# Patient Record
Sex: Female | Born: 1960
Health system: Southern US, Community
[De-identification: ages and names within clinical notes are randomized; demographics above are authoritative.]

## PROBLEM LIST (undated history)

## (undated) DIAGNOSIS — I428 Other cardiomyopathies: Secondary | ICD-10-CM

## (undated) DIAGNOSIS — E538 Deficiency of other specified B group vitamins: Secondary | ICD-10-CM

## (undated) DIAGNOSIS — I10 Essential (primary) hypertension: Secondary | ICD-10-CM

## (undated) DIAGNOSIS — M1990 Unspecified osteoarthritis, unspecified site: Secondary | ICD-10-CM

## (undated) DIAGNOSIS — I509 Heart failure, unspecified: Secondary | ICD-10-CM

## (undated) DIAGNOSIS — E7800 Pure hypercholesterolemia, unspecified: Secondary | ICD-10-CM

## (undated) DIAGNOSIS — E119 Type 2 diabetes mellitus without complications: Secondary | ICD-10-CM

## (undated) DIAGNOSIS — F3181 Bipolar II disorder: Secondary | ICD-10-CM

## (undated) DIAGNOSIS — N3946 Mixed incontinence: Secondary | ICD-10-CM

## (undated) DIAGNOSIS — E1300 Other specified diabetes mellitus with hyperosmolarity without nonketotic hyperglycemic-hyperosmolar coma (NKHHC): Secondary | ICD-10-CM

## (undated) DIAGNOSIS — F329 Major depressive disorder, single episode, unspecified: Secondary | ICD-10-CM

## (undated) DIAGNOSIS — K219 Gastro-esophageal reflux disease without esophagitis: Secondary | ICD-10-CM

## (undated) DIAGNOSIS — F32A Depression, unspecified: Secondary | ICD-10-CM

## (undated) HISTORY — DX: Unspecified osteoarthritis, unspecified site: M19.90

## (undated) HISTORY — DX: Heart failure, unspecified: I50.9

## (undated) HISTORY — DX: Other cardiomyopathies: I42.8

## (undated) HISTORY — PX: ABDOMINAL HYSTERECTOMY: SHX81

## (undated) HISTORY — PX: SKIN BIOPSY: SHX1

## (undated) HISTORY — DX: Other specified diabetes mellitus with hyperosmolarity without nonketotic hyperglycemic-hyperosmolar coma (NKHHC): E13.00

## (undated) HISTORY — DX: Pure hypercholesterolemia, unspecified: E78.00

## (undated) HISTORY — DX: Deficiency of other specified B group vitamins: E53.8

## (undated) HISTORY — DX: Type 2 diabetes mellitus without complications: E11.9

## (undated) HISTORY — DX: Bipolar II disorder: F31.81

## (undated) HISTORY — PX: EYE SURGERY: SHX253

## (undated) HISTORY — PX: CATARACT EXTRACTION: SUR2

## (undated) HISTORY — PX: ABDOMINAL SURGERY: SHX537

## (undated) HISTORY — PX: STOMACH SURGERY: SHX791

## (undated) HISTORY — PX: CHOLECYSTECTOMY: SHX55

## (undated) HISTORY — DX: Mixed incontinence: N39.46

---

## 2009-03-30 ENCOUNTER — Emergency Department (HOSPITAL_COMMUNITY): Admission: EM | Admit: 2009-03-30 | Discharge: 2009-03-30 | Payer: Self-pay | Admitting: Emergency Medicine

## 2011-02-25 ENCOUNTER — Inpatient Hospital Stay (INDEPENDENT_AMBULATORY_CARE_PROVIDER_SITE_OTHER)
Admission: RE | Admit: 2011-02-25 | Discharge: 2011-02-25 | Disposition: A | Discharge status: Home / Self Care | Payer: Self-pay | Source: Ambulatory Visit | Attending: Family Medicine | Admitting: Family Medicine

## 2011-02-25 DIAGNOSIS — M799 Soft tissue disorder, unspecified: Secondary | ICD-10-CM

## 2011-02-25 DIAGNOSIS — I10 Essential (primary) hypertension: Secondary | ICD-10-CM

## 2011-05-16 ENCOUNTER — Emergency Department (HOSPITAL_COMMUNITY)
Admission: EM | Admit: 2011-05-16 | Discharge: 2011-05-16 | Disposition: A | Discharge status: Home / Self Care | Payer: Self-pay | Source: Home / Self Care | Attending: Emergency Medicine | Admitting: Emergency Medicine

## 2011-05-16 DIAGNOSIS — J039 Acute tonsillitis, unspecified: Secondary | ICD-10-CM

## 2011-05-16 HISTORY — DX: Essential (primary) hypertension: I10

## 2011-05-16 HISTORY — DX: Depression, unspecified: F32.A

## 2011-05-16 HISTORY — DX: Major depressive disorder, single episode, unspecified: F32.9

## 2011-05-16 HISTORY — DX: Gastro-esophageal reflux disease without esophagitis: K21.9

## 2011-05-16 LAB — POCT RAPID STREP A: Streptococcus, Group A Screen (Direct): NEGATIVE

## 2011-05-16 MED ORDER — CEPHALEXIN 500 MG PO CAPS: 500.0000 mg | ORAL_CAPSULE | Freq: Three times a day (TID) | ORAL | Status: AC

## 2011-05-16 MED ORDER — LISINOPRIL-HYDROCHLOROTHIAZIDE 20-12.5 MG PO TABS: 1.0000 | ORAL_TABLET | Freq: Every day | ORAL | Status: DC

## 2011-05-16 NOTE — ED Notes (Signed)
Has had right earache for 9 days.  As well as sore throat.  Headache.

## 2011-05-16 NOTE — ED Provider Notes (Signed)
History     CSN: [id number] Arrival date & time: 05/16/2011  8:12 PM   First MD Initiated Contact with Patient 05/16/11 1854      Chief Complaint  Patient presents with  . Otalgia    Has had right earache for 9 days.  As well as sore throat.  Headache.     (Consider location/radiation/quality/duration/timing/severity/associated sxs/prior treatment) HPI Comments: She has had a 9 day history that began with a fever blister on her right upper lip. This healed up and then she developed sore throat with radiation to the right ear, swelling of the right side of the face, chills, and a dry cough. She denies any fever or chills. It hurts to swallow. She denies any nasal congestion or drainage. She's not had the wheezing, shortness of breath, nausea, or vomiting.  She also needs a refill on her lisinopril/HCTZ which she takes for blood pressure. She is thinking that her blood pressure might be elevated since she feels dizzy when she stands up, however her blood pressure in the urgent care center today was normal.  Patient is a 50 y.o. female presenting with ear pain.  Otalgia Associated symptoms include sore throat and cough. Pertinent negatives include no rhinorrhea, no abdominal pain, no diarrhea, no vomiting and no rash.    Past Medical History  Diagnosis Date  . Asthma   . Hypertension   . GERD (gastroesophageal reflux disease)   . Depression     Past Surgical History  Procedure Date  . Cesarian   . Cholecystectomy   . Abdominal surgery   . Abdominal hysterectomy     History reviewed. No pertinent family history.  History  Substance Use Topics  . Smoking status: Never Smoker   . Smokeless tobacco: Never Used  . Alcohol Use: No    OB History    Grav Para Term Preterm Abortions TAB SAB Ect Mult Living                  Review of Systems  Constitutional: Positive for chills. Negative for fever and fatigue.  HENT: Positive for ear pain, sore throat and mouth sores.  Negative for congestion, rhinorrhea, sneezing, neck stiffness, voice change and postnasal drip.   Eyes: Negative for pain, discharge and redness.  Respiratory: Positive for cough. Negative for chest tightness, shortness of breath and wheezing.   Gastrointestinal: Negative for nausea, vomiting, abdominal pain and diarrhea.  Skin: Negative for rash.    Allergies  Review of patient's allergies indicates no known allergies.  Home Medications   Current Outpatient Rx  Name Route Sig Dispense Refill  . BUPROPION HCL ER (XL) 150 MG PO TB24 Oral Take 150 mg by mouth daily.      . BUSPIRONE HCL 15 MG PO TABS Oral Take 15 mg by mouth 2 (two) times daily.      Marland Kitchen LISINOPRIL-HYDROCHLOROTHIAZIDE 20-12.5 MG PO TABS Oral Take 1 tablet by mouth daily.      . CEPHALEXIN 500 MG PO CAPS Oral Take 1 capsule (500 mg total) by mouth 3 (three) times daily. 30 capsule 0  . CYCLOBENZAPRINE HCL 5 MG PO TABS Oral Take 5 mg by mouth 3 (three) times daily as needed.      Marland Kitchen LISINOPRIL-HYDROCHLOROTHIAZIDE 20-12.5 MG PO TABS Oral Take 1 tablet by mouth daily. 30 tablet 0    BP 137/92  Pulse 94  Temp(Src) 98.3 F (36.8 C) (Oral)  Resp 18  SpO2 96%  Physical Exam  Nursing note and  vitals reviewed. Constitutional: She appears well-developed and well-nourished. No distress.  HENT:  Head: Normocephalic and atraumatic.  Right Ear: External ear normal.  Left Ear: External ear normal.  Nose: Nose normal.  Mouth/Throat: No oropharyngeal exudate.       Both tonsils were enlarged and red. There was a small amount of exudate present bilaterally. There were no ulcers inside the mouth. Both TMs and ear canals are normal. She had some tender anterior cervical adenopathy bilaterally.  Eyes: Conjunctivae and EOM are normal. Pupils are equal, round, and reactive to light. Right eye exhibits no discharge. Left eye exhibits no discharge.  Neck: Normal range of motion. Neck supple.  Cardiovascular: Normal rate, regular rhythm and  normal heart sounds.   Pulmonary/Chest: Effort normal and breath sounds normal. No stridor. No respiratory distress. She has no wheezes. She has no rales. She exhibits no tenderness.  Lymphadenopathy:    She has no cervical adenopathy.  Skin: Skin is warm and dry. No rash noted. She is not diaphoretic.    ED Course  Procedures (including critical care time)   Labs Reviewed  POCT RAPID STREP A ([facility] CARE ONLY)       her rapid strep was negative.   No results found.   1. Tonsillitis       MDM  She has symptomatic tonsillitis with a negative rapid strep. I elected to go ahead and treat this with cephalexin. I also refilled her lisinopril/hydrochlorothiazide. I strongly admonished her to find a primary care physician for followup of her blood pressure.        Roque Lias, MD 05/16/11 2133

## 2011-09-09 ENCOUNTER — Encounter (HOSPITAL_COMMUNITY): Payer: Self-pay | Admitting: Emergency Medicine

## 2011-09-09 ENCOUNTER — Inpatient Hospital Stay (HOSPITAL_COMMUNITY)
Admission: EM | Admit: 2011-09-09 | Discharge: 2011-09-11 | DRG: 638 | Disposition: A | Discharge status: Home / Self Care | Payer: Self-pay | Attending: Internal Medicine | Admitting: Internal Medicine

## 2011-09-09 DIAGNOSIS — R42 Dizziness and giddiness: Secondary | ICD-10-CM | POA: Diagnosis present

## 2011-09-09 DIAGNOSIS — E1110 Type 2 diabetes mellitus with ketoacidosis without coma: Secondary | ICD-10-CM | POA: Diagnosis present

## 2011-09-09 DIAGNOSIS — F3289 Other specified depressive episodes: Secondary | ICD-10-CM | POA: Diagnosis present

## 2011-09-09 DIAGNOSIS — E1300 Other specified diabetes mellitus with hyperosmolarity without nonketotic hyperglycemic-hyperosmolar coma (NKHHC): Secondary | ICD-10-CM | POA: Diagnosis present

## 2011-09-09 DIAGNOSIS — J45909 Unspecified asthma, uncomplicated: Secondary | ICD-10-CM | POA: Diagnosis present

## 2011-09-09 DIAGNOSIS — F329 Major depressive disorder, single episode, unspecified: Secondary | ICD-10-CM | POA: Diagnosis present

## 2011-09-09 DIAGNOSIS — E860 Dehydration: Secondary | ICD-10-CM | POA: Diagnosis present

## 2011-09-09 DIAGNOSIS — K219 Gastro-esophageal reflux disease without esophagitis: Secondary | ICD-10-CM | POA: Diagnosis present

## 2011-09-09 DIAGNOSIS — I10 Essential (primary) hypertension: Secondary | ICD-10-CM | POA: Diagnosis present

## 2011-09-09 DIAGNOSIS — R739 Hyperglycemia, unspecified: Secondary | ICD-10-CM

## 2011-09-09 DIAGNOSIS — E875 Hyperkalemia: Secondary | ICD-10-CM | POA: Diagnosis present

## 2011-09-09 DIAGNOSIS — E1100 Type 2 diabetes mellitus with hyperosmolarity without nonketotic hyperglycemic-hyperosmolar coma (NKHHC): Principal | ICD-10-CM | POA: Diagnosis present

## 2011-09-09 DIAGNOSIS — E871 Hypo-osmolality and hyponatremia: Secondary | ICD-10-CM | POA: Diagnosis present

## 2011-09-09 DIAGNOSIS — E876 Hypokalemia: Secondary | ICD-10-CM | POA: Diagnosis not present

## 2011-09-09 LAB — GLUCOSE, CAPILLARY
Glucose-Capillary: 155 mg/dL — ABNORMAL HIGH (ref 70–99)
Glucose-Capillary: 172 mg/dL — ABNORMAL HIGH (ref 70–99)
Glucose-Capillary: 173 mg/dL — ABNORMAL HIGH (ref 70–99)
Glucose-Capillary: 316 mg/dL — ABNORMAL HIGH (ref 70–99)
Glucose-Capillary: 595 mg/dL (ref 70–99)
Glucose-Capillary: >600 mg/dL (ref 70–99)

## 2011-09-09 LAB — BASIC METABOLIC PANEL
BUN: 10 mg/dL (ref 6–23)
BUN: 8 mg/dL (ref 6–23)
BUN: 8 mg/dL (ref 6–23)
CO2: 27 mEq/L (ref 19–32)
CO2: 26 mEq/L (ref 19–32)
CO2: 27 mEq/L (ref 19–32)
CO2: 28 mEq/L (ref 19–32)
Calcium: 10.5 mg/dL (ref 8.4–10.5)
Calcium: 9.5 mg/dL (ref 8.4–10.5)
Calcium: 9.5 mg/dL (ref 8.4–10.5)
Chloride: 97 mEq/L (ref 96–112)
Chloride: 101 mEq/L (ref 96–112)
Chloride: 100 mEq/L (ref 96–112)
Creatinine, Ser: 0.81 mg/dL (ref 0.50–1.10)
Creatinine, Ser: 0.67 mg/dL (ref 0.50–1.10)
Creatinine, Ser: 0.68 mg/dL (ref 0.50–1.10)
Creatinine, Ser: 0.64 mg/dL (ref 0.50–1.10)
GFR calc Af Amer: >90 mL/min (ref >90)
GFR calc Af Amer: >90 mL/min (ref >90)
GFR calc Af Amer: >90 mL/min (ref >90)
GFR calc Af Amer: >90 mL/min (ref >90)
GFR calc non Af Amer: 83 mL/min — ABNORMAL LOW (ref >90)
GFR calc non Af Amer: >90 mL/min (ref >90)
GFR calc non Af Amer: >90 mL/min (ref >90)
Glucose, Bld: 502 mg/dL — ABNORMAL HIGH (ref 70–99)
Glucose, Bld: 309 mg/dL — ABNORMAL HIGH (ref 70–99)
Potassium: 4.1 mEq/L (ref 3.5–5.1)
Potassium: 3.9 mEq/L (ref 3.5–5.1)
Potassium: 3.4 mEq/L — ABNORMAL LOW (ref 3.5–5.1)
Sodium: 125 mEq/L — ABNORMAL LOW (ref 135–145)
Sodium: 138 mEq/L (ref 135–145)
Sodium: 140 mEq/L (ref 135–145)

## 2011-09-09 LAB — CBC
HCT: 38.7 % (ref 36.0–46.0)
HCT: 36.9 % (ref 36.0–46.0)
Hemoglobin: 13.2 g/dL (ref 12.0–15.0)
MCH: 28.1 pg (ref 26.0–34.0)
MCH: 28.6 pg (ref 26.0–34.0)
MCHC: 31.3 g/dL (ref 30.0–36.0)
MCHC: 34.1 g/dL (ref 30.0–36.0)
MCV: 90.4 fL (ref 78.0–100.0)
MCV: 82.4 fL (ref 78.0–100.0)
Platelets: 272 10*3/uL (ref 150–400)
Platelets: 274 10*3/uL (ref 150–400)
RBC: 4.48 MIL/uL (ref 3.87–5.11)
RDW: 15.1 % (ref 11.5–15.5)
RDW: 13.7 % (ref 11.5–15.5)
RDW: 13.8 % (ref 11.5–15.5)
WBC: 7.3 10*3/uL (ref 4.0–10.5)
WBC: 10.6 10*3/uL — ABNORMAL HIGH (ref 4.0–10.5)

## 2011-09-09 LAB — URINALYSIS, ROUTINE W REFLEX MICROSCOPIC
Glucose, UA: >1000 mg/dL — AB
Ketones, ur: 15 mg/dL — AB
Leukocytes, UA: NEGATIVE
Nitrite: NEGATIVE
Protein, ur: NEGATIVE mg/dL
Urobilinogen, UA: 0.2 mg/dL (ref 0.0–1.0)

## 2011-09-09 LAB — MRSA PCR SCREENING: MRSA by PCR: NEGATIVE

## 2011-09-09 LAB — DIFFERENTIAL
Basophils Absolute: 0 10*3/uL (ref 0.0–0.1)
Basophils Relative: 0 % (ref 0–1)
Eosinophils Relative: 0 % (ref 0–5)
Lymphocytes Relative: 17 % (ref 12–46)
Monocytes Absolute: 0.8 10*3/uL (ref 0.1–1.0)

## 2011-09-09 MED ORDER — PANTOPRAZOLE SODIUM 40 MG PO TBEC
40.0000 mg | DELAYED_RELEASE_TABLET | Freq: Every day | ORAL | Status: DC
  Administered 2011-09-10 – 2011-09-11 (×2): 40 mg via ORAL
  Filled 2011-09-09 (×2): qty 1

## 2011-09-09 MED ORDER — SODIUM CHLORIDE 0.9 % IV SOLN
INTRAVENOUS | Status: DC
  Administered 2011-09-10: 4.8 [IU]/h via INTRAVENOUS
  Filled 2011-09-09 (×6): qty 1

## 2011-09-09 MED ORDER — SODIUM CHLORIDE 0.9 % IJ SOLN
3.0000 mL | Freq: Two times a day (BID) | INTRAMUSCULAR | Status: DC
  Administered 2011-09-11: 3 mL via INTRAVENOUS

## 2011-09-09 MED ORDER — ONDANSETRON HCL 4 MG PO TABS: 4.0000 mg | ORAL_TABLET | Freq: Four times a day (QID) | ORAL | Status: DC | PRN

## 2011-09-09 MED ORDER — ALUM & MAG HYDROXIDE-SIMETH 200-200-20 MG/5ML PO SUSP: 30.0000 mL | Freq: Four times a day (QID) | ORAL | Status: DC | PRN

## 2011-09-09 MED ORDER — SODIUM CHLORIDE 0.9 % IV SOLN: INTRAVENOUS | Status: DC

## 2011-09-09 MED ORDER — SODIUM CHLORIDE 0.9 % IV BOLUS (SEPSIS)
1000.0000 mL | Freq: Once | INTRAVENOUS | Status: AC
  Administered 2011-09-09: 1000 mL via INTRAVENOUS

## 2011-09-09 MED ORDER — SODIUM CHLORIDE 0.9 % IV SOLN
INTRAVENOUS | Status: DC
  Administered 2011-09-09: 5.4 [IU]/h via INTRAVENOUS
  Filled 2011-09-09: qty 1

## 2011-09-09 MED ORDER — DARIFENACIN HYDROBROMIDE ER 7.5 MG PO TB24
7.5000 mg | ORAL_TABLET | Freq: Every day | ORAL | Status: DC
  Administered 2011-09-10 – 2011-09-11 (×2): 7.5 mg via ORAL
  Filled 2011-09-09 (×2): qty 1

## 2011-09-09 MED ORDER — DEXTROSE 50 % IV SOLN: 25.0000 mL | INTRAVENOUS | Status: DC | PRN

## 2011-09-09 MED ORDER — CYCLOBENZAPRINE HCL 10 MG PO TABS: 5.0000 mg | ORAL_TABLET | Freq: Three times a day (TID) | ORAL | Status: DC | PRN

## 2011-09-09 MED ORDER — POTASSIUM CHLORIDE 10 MEQ/100ML IV SOLN
INTRAVENOUS | Status: AC
  Filled 2011-09-09: qty 100

## 2011-09-09 MED ORDER — INSULIN REGULAR BOLUS VIA INFUSION
0.0000 [IU] | Freq: Three times a day (TID) | INTRAVENOUS | Status: DC
  Filled 2011-09-09: qty 10

## 2011-09-09 MED ORDER — POTASSIUM CHLORIDE 10 MEQ/100ML IV SOLN
10.0000 meq | INTRAVENOUS | Status: AC
  Administered 2011-09-09 – 2011-09-10 (×2): 10 meq via INTRAVENOUS
  Filled 2011-09-09 (×2): qty 100

## 2011-09-09 MED ORDER — HYDROCODONE-ACETAMINOPHEN 5-325 MG PO TABS: 1.0000 | ORAL_TABLET | ORAL | Status: DC | PRN

## 2011-09-09 MED ORDER — SODIUM CHLORIDE 0.9 % IV SOLN
INTRAVENOUS | Status: DC
  Administered 2011-09-09: 18:00:00 via INTRAVENOUS

## 2011-09-09 MED ORDER — HYDROMORPHONE HCL PF 1 MG/ML IJ SOLN: 1.0000 mg | INTRAMUSCULAR | Status: DC | PRN

## 2011-09-09 MED ORDER — SODIUM CHLORIDE 0.9 % IV SOLN
INTRAVENOUS | Status: DC
  Administered 2011-09-09: 14:00:00 via INTRAVENOUS

## 2011-09-09 MED ORDER — POTASSIUM CHLORIDE 10 MEQ/100ML IV SOLN
10.0000 meq | INTRAVENOUS | Status: AC
  Administered 2011-09-09 (×2): 10 meq via INTRAVENOUS
  Filled 2011-09-09: qty 100

## 2011-09-09 MED ORDER — ACETAMINOPHEN 325 MG PO TABS: 650.0000 mg | ORAL_TABLET | Freq: Four times a day (QID) | ORAL | Status: DC | PRN

## 2011-09-09 MED ORDER — DEXTROSE-NACL 5-0.45 % IV SOLN
INTRAVENOUS | Status: DC
  Administered 2011-09-09 – 2011-09-10 (×2): via INTRAVENOUS

## 2011-09-09 MED ORDER — SODIUM CHLORIDE 0.9 % IV SOLN
INTRAVENOUS | Status: AC
  Administered 2011-09-09: 16:00:00 via INTRAVENOUS

## 2011-09-09 MED ORDER — ACETAMINOPHEN 650 MG RE SUPP: 650.0000 mg | Freq: Four times a day (QID) | RECTAL | Status: DC | PRN

## 2011-09-09 MED ORDER — ONDANSETRON HCL 4 MG/2ML IJ SOLN: 4.0000 mg | Freq: Four times a day (QID) | INTRAMUSCULAR | Status: DC | PRN

## 2011-09-09 NOTE — ED Notes (Signed)
POCT CBG resulted 595; Melissa, RN notified

## 2011-09-09 NOTE — H&P (Signed)
History and Physical       Hospital Admission Note Date: 09/09/2011  Patient name: Jean Melton Medical record number: [id number] Date of birth: January 21, 1961 Age: 51 y.o. Gender: female PCP: Pcp Not In System  Attending physician: Cathren Harsh, MD   Chief Complaint:  Generalized weakness with polyuria and polydipsia for last 2-3 weeks  HPI: Patient is a 51 year old female with history of asthma, hypertension, GERD presented to [facility] emergency room with worsening generalized weakness and feeling dehydrated for last several weeks. History was obtained from the patient and her daughter present in the room. Patient states that over the last 3-4 weeks she was noticing increasing frequency of urination, almost every hour and hence she was drinking a lot of fluids. She stated that she was initially drinking water which did not have them and she started drinking juices and ginger ale. She still felt generalized weakness and chapped lips. She felt dizzy and lightheaded but no syncopal episodes. Patient and her daughter came to the ER for checkup and was found to have blood glucose in 1300's, anion gap of 16 with positive ketones. Patient has no known history of diabetes mellitus although her father had diabetes. She has no PCP. Also states has 20 pounds weight loss in the last 2 months, has not been eating well.  Review of Systems:  Constitutional: Denies fever, chills, diaphoresis, appetite change and fatigue.  HEENT: Denies photophobia, eye pain, redness, hearing loss, ear pain, congestion, sore throat, rhinorrhea, sneezing, mouth sores, trouble swallowing, neck pain, neck stiffness and tinnitus.   Respiratory: Denies SOB, DOE, cough, chest tightness,  and wheezing.   Cardiovascular: Denies chest pain, palpitations and leg swelling.  Gastrointestinal: Denies nausea, vomiting, abdominal pain, diarrhea, constipation, blood in stool and abdominal  distention.  Genitourinary: Denies frequency, hematuria, flank pain and difficulty urinating.  endorses increased polydipsia and polyuria Musculoskeletal: Denies myalgias, back pain, joint swelling, arthralgias and gait problem.  Skin: Denies pallor, rash and wound.  Neurological: Denies seizures, syncope, numbness and headaches.  has dizziness, lightheadedness  Hematological: Denies adenopathy. Easy bruising, personal or family bleeding history  Psychiatric/Behavioral: Denies suicidal ideation, mood changes, confusion, nervousness, sleep disturbance and agitation  Past Medical History: Past Medical History  Diagnosis Date  . Asthma   . Hypertension   . GERD (gastroesophageal reflux disease)   . Depression    Past Surgical History  Procedure Date  . Cesarian   . Cholecystectomy   . Abdominal surgery   . Abdominal hysterectomy     Medications: Prior to Admission medications   Medication Sig Start Date End Date Taking? Authorizing Provider  lisinopril-hydrochlorothiazide (PRINZIDE,ZESTORETIC) 20-12.5 MG per tablet Take 1 tablet by mouth daily.     Yes Historical Provider, MD  omeprazole (PRILOSEC) 20 MG capsule Take 20 mg by mouth daily.   Yes Historical Provider, MD  solifenacin (VESICARE) 5 MG tablet Take 10 mg by mouth daily.   Yes Historical Provider, MD  cyclobenzaprine (FLEXERIL) 5 MG tablet Take 5 mg by mouth 3 (three) times daily as needed. For muscle spasms    Historical Provider, MD    Allergies:  No Known Allergies  Social History:  reports that she has never smoked. She has never used smokeless tobacco. She reports that she does not drink alcohol or use illicit drugs.  Family History: History reviewed. No pertinent family history.  Physical Exam: Blood pressure 132/88, pulse 83, temperature 97.3 F (36.3 C), temperature source Oral, resp. rate 18, SpO2 96.00%. General:  Alert, awake, oriented x3, in no acute distress, pleasant and cooperative, keeps drinking  water throughout the examination  HEENT: anicteric sclera, pink conjunctiva, pupils equal and reactive to light and accomodation, dry mucosa membranes with chapped lips Neck: supple, no masses or lymphadenopathy, no goiter, no bruits  Heart: Regular rate and rhythm, without murmurs, rubs or gallops. Lungs: Clear to auscultation bilaterally, no wheezing, rales or rhonchi. Abdomen: Soft, nontender, nondistended, positive bowel sounds, no masses. Extremities: No clubbing, cyanosis or edema with positive pedal pulses. Neuro: Grossly intact, no focal neurological deficits, strength 5/5 upper and lower extremities bilaterally Psych: alert and oriented x 3, normal mood and affect Skin: no rashes or lesions, warm and dry   LABS on Admission:  Basic Metabolic Panel:  Lab 09/09/11 1308  NA 125*  K 5.1  CL 82*  CO2 27  GLUCOSE 1302*  BUN 12  CREATININE 0.81  CALCIUM 10.5  MG --  PHOS --    CBC:  Lab 09/09/11 1111  WBC 7.3  NEUTROABS 5.2  HGB 15.0  HCT 47.9*  MCV 90.4  PLT 272   CBG:  Lab 09/09/11 1325 09/09/11 1026  GLUCAP 595* >600*     Radiological Exams on Admission: No results found.  Assessment/Plan  .DKA (diabetic ketoacidoses) - Anion gap of 16 with positive ketones likely from dehydration,  bicarbonate 27, likely hyperosmolar hyperglycemia with positive ketones - Placed on IV insulin drip and aggressive IV fluid hydration, follow DKA protocol and transitioned to Lantus and sliding scale insulin once blood sugars within target range of 140- 180   .Diabetes mellitus, new onset: - Obtain HbA1c, discussed with case management as patient is going to need PCP, aggressive diabetic education and dietitian consult  .Hyponatremia: Pseudohyponatremia, corrected sodium 144   .Hyperkalemia: Likely to improve with insulin infusion   .HTN (hypertension): Hold HCTZ  for now, currently stable   .Dehydration: Continue aggressive IV fluid hydration   .GERD (gastroesophageal  reflux disease): Continue protonix   .Asthma: Stable  DVT prophylaxis: SCDs  CODE STATUS: Full code  @Time  Spent on Admission: 1 hour Jean Melton M.D. [facility] 09/09/2011, 2:02 PM

## 2011-09-09 NOTE — ED Notes (Signed)
Pt received from stretcher triage, report received. Pt placed on monitor. Pt reports several week hx of not feeling well, reports generalized fatigue. Reports increased thirst. Pt reports unintentional weight loss. Pt with no hx of diabetes.

## 2011-09-09 NOTE — ED Notes (Signed)
Dr. Anitra Lauth notified of high blood sugar

## 2011-09-09 NOTE — ED Provider Notes (Addendum)
History     CSN: [id number]  Arrival date & time 09/09/11  7829   First MD Initiated Contact with Patient 09/09/11 1009      Chief Complaint  Patient presents with  . Weight Loss  . Weakness    (Consider location/radiation/quality/duration/timing/severity/associated sxs/prior treatment) Patient is a 51 y.o. female presenting with weakness. The history is provided by the patient.  Weakness Primary symptoms do not include headaches, syncope, dizziness, paresthesias, speech change, fever, nausea or vomiting. Primary symptoms comment: Generalized weakness Episode onset: 2 months ago. The symptoms are worsening. The neurological symptoms are diffuse. Context: Has just noticed over the last 2 munchies gradually developed generalized weakness.  Additional symptoms include weakness and taste disturbance. Additional symptoms do not include lower back pain or leg pain. Associated symptoms comments: Polyuria and polydipsia. Medical issues do not include seizures, alcohol use, drug use or diabetes.    Past Medical History  Diagnosis Date  . Asthma   . Hypertension   . GERD (gastroesophageal reflux disease)   . Depression     Past Surgical History  Procedure Date  . Cesarian   . Cholecystectomy   . Abdominal surgery   . Abdominal hysterectomy     History reviewed. No pertinent family history.  History  Substance Use Topics  . Smoking status: Never Smoker   . Smokeless tobacco: Never Used  . Alcohol Use: No    OB History    Grav Para Term Preterm Abortions TAB SAB Ect Mult Living                  Review of Systems  Constitutional: Positive for unexpected weight change. Negative for fever.       20 pound weight loss in 2 months  Cardiovascular: Negative for syncope.  Gastrointestinal: Negative for nausea, vomiting and abdominal pain.  Genitourinary: Negative for dysuria.       Polyuria   Neurological: Positive for weakness. Negative for dizziness, speech change, headaches  and paresthesias.  All other systems reviewed and are negative.    Allergies  Review of patient's allergies indicates no known allergies.  Home Medications   Current Outpatient Rx  Name Route Sig Dispense Refill  . LISINOPRIL-HYDROCHLOROTHIAZIDE 20-12.5 MG PO TABS Oral Take 1 tablet by mouth daily.      Marland Kitchen OMEPRAZOLE 20 MG PO CPDR Oral Take 20 mg by mouth daily.    Marland Kitchen SOLIFENACIN SUCCINATE 5 MG PO TABS Oral Take 10 mg by mouth daily.    . CYCLOBENZAPRINE HCL 5 MG PO TABS Oral Take 5 mg by mouth 3 (three) times daily as needed. For muscle spasms      BP 142/89  Pulse 107  Temp(Src) 97.3 F (36.3 C) (Oral)  Resp 20  SpO2 99%  Physical Exam  Nursing note and vitals reviewed. Constitutional: She is oriented to person, place, and time. She appears well-developed and well-nourished. No distress.  HENT:  Head: Normocephalic and atraumatic.  Mouth/Throat: Mucous membranes are dry.  Eyes: EOM are normal. Pupils are equal, round, and reactive to light.  Neck: Normal range of motion.  Cardiovascular: Normal rate, regular rhythm, normal heart sounds and intact distal pulses.  Exam reveals no friction rub.   No murmur heard. Pulmonary/Chest: Effort normal and breath sounds normal. She has no wheezes. She has no rales.  Abdominal: Soft. Bowel sounds are normal. She exhibits no distension. There is no tenderness. There is no rebound and no guarding.  Musculoskeletal: Normal range of motion. She exhibits  no tenderness.       No edema  Neurological: She is alert and oriented to person, place, and time. No cranial nerve deficit.  Skin: Skin is warm and dry. No rash noted.  Psychiatric: She has a normal mood and affect. Her behavior is normal.    ED Course  Procedures (including critical care time)  Labs Reviewed  CBC - Abnormal; Notable for the following:    RBC 5.30 (*)    HCT 47.9 (*)    All other components within normal limits  BASIC METABOLIC PANEL - Abnormal; Notable for the  following:    Sodium 125 (*)    Chloride 82 (*)    Glucose, Bld 1302 (*)    GFR calc non Af Amer 83 (*)    All other components within normal limits  URINALYSIS, ROUTINE W REFLEX MICROSCOPIC - Abnormal; Notable for the following:    Specific Gravity, Urine 1.035 (*)    Glucose, UA >1000 (*)    Ketones, ur 15 (*)    All other components within normal limits  GLUCOSE, CAPILLARY - Abnormal; Notable for the following:    Glucose-Capillary >600 (*)    All other components within normal limits  DIFFERENTIAL  URINE MICROSCOPIC-ADD ON   No results found.   1. Hyperglycemia       MDM   Patient with vague symptoms of polyuria, polydipsia, weight loss, fatigue. She states this has occurred over the last 2 months. She is well-appearing on exam however signs of dehydration. Concern for possible new onset diabetes. Patient has a strong family history on both sides of diabetes and denies a prior diagnosis of diabetes. She denies any infectious symptoms such as fever, cough, abdominal pain, dysuria. CBC, BMP, UA pending. CBG here >600  12:56 PM Blood sugar of 1300 with hyponatremia that's consistent with pseudohyponatremia from elevated blood sugar. Patient does not have a regular doctor. She was given more IV fluids and started on an insulin pump.      Gwyneth Sprout, MD 09/09/11 1029  Gwyneth Sprout, MD 09/09/11 1256  Gwyneth Sprout, MD 09/09/11 1322

## 2011-09-09 NOTE — ED Notes (Signed)
Admitting at bedside 

## 2011-09-09 NOTE — ED Notes (Signed)
2606-01 Ready 

## 2011-09-09 NOTE — ED Notes (Signed)
Pt ambulatory to the bathroom with no difficulty and no distress

## 2011-09-09 NOTE — Progress Notes (Signed)
   CARE MANAGEMENT NOTE 09/09/2011  Patient:  Jean Melton, Jean Melton   Account Number:  [id number]  Date Initiated:  09/09/2011  Documentation initiated by:  Junius Creamer  Subjective/Objective Assessment:   adm w elev blood sugar     Action/Plan:   lives alone, no pcp   Anticipated DC Date:  09/11/2011   Anticipated DC Plan:  HOME/SELF CARE      DC Planning Services  CM consult      Choice offered to / List presented to:             Status of service:   Medicare Important Message given?   (If response is "NO", the following Medicare IM given date fields will be blank) Date Medicare IM given:   Date Additional Medicare IM given:    Discharge Disposition:    Per UR Regulation:    Comments:  3/8 gave pt inorm on [facility]. [facility] not taking new pt's at present. [facility] has pt call for elig then they give appt. gave inform to pt to call for elidg. also have call into [facility]. they are ck to see if they can see pt. i told pt if dc over weekend and i hear about appt i will call her. will ask weekend cm to ck on pt and assist w some meds if disch. debbie Analiza Cowger rn,bsn [id number]

## 2011-09-09 NOTE — ED Notes (Signed)
Pt c/o dry mouth, weight loss of 20lb and generalized weakness with change in speech x 2 months

## 2011-09-10 LAB — CBC
MCH: 27.9 pg (ref 26.0–34.0)
MCV: 83.1 fL (ref 78.0–100.0)
Platelets: 265 10*3/uL (ref 150–400)
RBC: 4.44 MIL/uL (ref 3.87–5.11)
RDW: 14 % (ref 11.5–15.5)

## 2011-09-10 LAB — GLUCOSE, CAPILLARY
Glucose-Capillary: 236 mg/dL — ABNORMAL HIGH (ref 70–99)
Glucose-Capillary: 345 mg/dL — ABNORMAL HIGH (ref 70–99)

## 2011-09-10 LAB — BASIC METABOLIC PANEL
CO2: 28 mEq/L (ref 19–32)
Calcium: 9 mg/dL (ref 8.4–10.5)
Chloride: 101 mEq/L (ref 96–112)
Creatinine, Ser: 0.67 mg/dL (ref 0.50–1.10)
Glucose, Bld: 162 mg/dL — ABNORMAL HIGH (ref 70–99)

## 2011-09-10 LAB — URINE CULTURE: Culture  Setup Time: 201303081623

## 2011-09-10 MED ORDER — ASPIRIN EC 81 MG PO TBEC
81.0000 mg | DELAYED_RELEASE_TABLET | Freq: Every day | ORAL | Status: DC
  Administered 2011-09-10 – 2011-09-11 (×2): 81 mg via ORAL
  Filled 2011-09-10 (×2): qty 1

## 2011-09-10 MED ORDER — INSULIN ASPART 100 UNIT/ML ~~LOC~~ SOLN
3.0000 [IU] | Freq: Three times a day (TID) | SUBCUTANEOUS | Status: DC
  Administered 2011-09-10 – 2011-09-11 (×3): 3 [IU] via SUBCUTANEOUS

## 2011-09-10 MED ORDER — INSULIN GLARGINE 100 UNIT/ML ~~LOC~~ SOLN
10.0000 [IU] | Freq: Every day | SUBCUTANEOUS | Status: DC
  Administered 2011-09-10: 10 [IU] via SUBCUTANEOUS
  Filled 2011-09-10: qty 3

## 2011-09-10 MED ORDER — INSULIN ASPART 100 UNIT/ML ~~LOC~~ SOLN
0.0000 [IU] | Freq: Three times a day (TID) | SUBCUTANEOUS | Status: DC
  Administered 2011-09-11: 11 [IU] via SUBCUTANEOUS
  Administered 2011-09-11: 7 [IU] via SUBCUTANEOUS
  Filled 2011-09-10: qty 3

## 2011-09-10 MED ORDER — INSULIN REGULAR HUMAN 100 UNIT/ML IJ SOLN: 22.0000 [IU] | Freq: Once | INTRAMUSCULAR | Status: DC

## 2011-09-10 MED ORDER — INSULIN ASPART 100 UNIT/ML ~~LOC~~ SOLN
22.0000 [IU] | Freq: Once | SUBCUTANEOUS | Status: AC
  Administered 2011-09-10: 22 [IU] via SUBCUTANEOUS

## 2011-09-10 MED ORDER — LISINOPRIL 20 MG PO TABS
20.0000 mg | ORAL_TABLET | Freq: Every day | ORAL | Status: DC
  Administered 2011-09-10 – 2011-09-11 (×2): 20 mg via ORAL
  Filled 2011-09-10 (×2): qty 1

## 2011-09-10 MED ORDER — SODIUM CHLORIDE 0.9 % IV SOLN
INTRAVENOUS | Status: DC
  Administered 2011-09-10: 13:00:00 via INTRAVENOUS

## 2011-09-10 MED ORDER — LIVING WELL WITH DIABETES BOOK
Freq: Once | Status: AC
  Administered 2011-09-10: 07:00:00
  Filled 2011-09-10: qty 1

## 2011-09-10 MED ORDER — POTASSIUM CHLORIDE CRYS ER 20 MEQ PO TBCR
40.0000 meq | EXTENDED_RELEASE_TABLET | Freq: Two times a day (BID) | ORAL | Status: DC
  Administered 2011-09-11: 40 meq via ORAL
  Filled 2011-09-10 (×3): qty 2

## 2011-09-10 MED ORDER — INSULIN GLARGINE 100 UNIT/ML ~~LOC~~ SOLN
20.0000 [IU] | Freq: Every day | SUBCUTANEOUS | Status: DC
  Administered 2011-09-10: 20 [IU] via SUBCUTANEOUS
  Filled 2011-09-10: qty 3

## 2011-09-10 MED ORDER — HYDROCHLOROTHIAZIDE 12.5 MG PO CAPS
12.5000 mg | ORAL_CAPSULE | Freq: Every day | ORAL | Status: DC
  Administered 2011-09-10: 12.5 mg via ORAL
  Filled 2011-09-10 (×2): qty 1

## 2011-09-10 MED ORDER — INSULIN ASPART 100 UNIT/ML ~~LOC~~ SOLN
0.0000 [IU] | SUBCUTANEOUS | Status: DC
  Administered 2011-09-10 (×3): 5 [IU] via SUBCUTANEOUS
  Filled 2011-09-10: qty 3

## 2011-09-10 MED ORDER — INSULIN GLARGINE 100 UNIT/ML ~~LOC~~ SOLN: 10.0000 [IU] | Freq: Every day | SUBCUTANEOUS | Status: DC

## 2011-09-10 MED ORDER — INSULIN ASPART 100 UNIT/ML ~~LOC~~ SOLN
0.0000 [IU] | Freq: Every day | SUBCUTANEOUS | Status: DC
Start: 2011-09-10 — End: 2011-09-11
  Administered 2011-09-10: 3 [IU] via SUBCUTANEOUS

## 2011-09-10 MED ORDER — LISINOPRIL-HYDROCHLOROTHIAZIDE 20-12.5 MG PO TABS: 1.0000 | ORAL_TABLET | Freq: Every day | ORAL | Status: DC

## 2011-09-10 NOTE — Progress Notes (Addendum)
[facility] [place] TEAM 1 - Stepdown/ICU TEAM  Subjective: 51 year old female with history of asthma, hypertension, GERD presented to [facility] emergency room with worsening generalized weakness and feeling dehydrated for last several weeks.  She described a 4 week hx of polyuria and polydipsia.  At the time of my visit she is doing well.  She denies f/c, sob, n/v, or abdom pain.  Her CBGs remain very difficult to control.  Objective: Weight change:   Intake/Output Summary (Last 24 hours) at 09/10/11 1203 Last data filed at 09/10/11 0900  Gross per 24 hour  Intake 4973.02 ml  Output    551 ml  Net 4422.02 ml   Blood pressure 134/95, pulse 91, temperature 98.5 F (36.9 C), temperature source Oral, resp. rate 16, height 5\' 6"  (1.676 m), weight 80.3 kg (177 lb 0.5 oz), SpO2 98.00%.  Physical Exam: General: No acute respiratory distress Lungs: Clear to auscultation bilaterally without wheezes or crackles Cardiovascular: Regular rate and rhythm without murmur gallop or rub normal S1 and S2 Abdomen: Nontender, nondistended, soft, bowel sounds positive, no rebound, no ascites, no appreciable mass Extremities: No significant cyanosis, clubbing, or edema bilateral lower extremities  Lab Results:  [facility] 09/09/11 2347 09/09/11 2014 09/09/11 1623  NA 139 138 140  K 3.2* 3.4* 3.9  CL 101 100 101  CO2 28 28 27   GLUCOSE 162* 248* 309*  BUN 8 8 8   CREATININE 0.67 0.64 0.68  CALCIUM 9.0 8.9 9.5  MG -- -- --  PHOS -- -- --    Basename 09/10/11 0340 09/09/11 2014 09/09/11 1700 09/09/11 1111  WBC 11.2* 10.6* 12.1* --  NEUTROABS -- -- -- 5.2  HGB 12.4 12.8 13.2 --  HCT 36.9 36.9 38.7 --  MCV 83.1 82.4 82.3 --  PLT 265 274 290 --    Basename 09/09/11 1420  HGBA1C 16.3*    Micro Results: Recent Results (from the past 240 hour(s))  MRSA PCR SCREENING     Status: Normal   Collection Time   09/09/11  4:20 PM      Component Value Range Status Comment   MRSA by PCR  NEGATIVE  NEGATIVE  Final     Studies/Results: All recent x-ray/radiology reports have been reviewed in detail.   Medications: I have reviewed the patient's complete medication list.  Assessment/Plan:  DKA Resolved - AG now 10 w/ bicarb 28 - off insulin gtt   Newly diagnosed DM Education begun - CBG proving to be quite erratic - A1c noted to be 16.3 -  suspect insulin will be required at home - adjust regimen - will need CBG to be better controlled prior to d/c - resume Ace I (was taking at home) and add low dose ASA  Hyponatremia Pseudo due to hyperglycemia - resolved  Hyperkalemia/hypokalemia Due to DKA/cellular shift - now needs replacement - follow trend  HTN Controlled at present - follow   GERD  Asthma No evidence of acute exacerbation  Dispo Stable for transfer to medical bed  Lonia Blood, MD [facility] Office  [phone] Pager [phone]  On-Call/Text Page:      [email or web address]      password [facility]

## 2011-09-10 NOTE — Progress Notes (Signed)
Patient has been provided diabetes teaching booklet and patient care manual with diabetic videos marked for viewing, patient has viewed booklet and has not questions at present, seems very receptive to teaching and shows interest in learning re her health condition.  Report given to Robin RN on 5500, to transfer to [phone] via wheelchair, Berle Mull RN

## 2011-09-11 DIAGNOSIS — E1300 Other specified diabetes mellitus with hyperosmolarity without nonketotic hyperglycemic-hyperosmolar coma (NKHHC): Secondary | ICD-10-CM | POA: Diagnosis present

## 2011-09-11 HISTORY — DX: Other specified diabetes mellitus with hyperosmolarity without nonketotic hyperglycemic-hyperosmolar coma (NKHHC): E13.00

## 2011-09-11 LAB — CBC
HCT: 40.8 % (ref 36.0–46.0)
Hemoglobin: 13.7 g/dL (ref 12.0–15.0)
MCH: 28.2 pg (ref 26.0–34.0)
MCHC: 33.6 g/dL (ref 30.0–36.0)
RBC: 4.85 MIL/uL (ref 3.87–5.11)

## 2011-09-11 LAB — BASIC METABOLIC PANEL
BUN: 7 mg/dL (ref 6–23)
CO2: 22 mEq/L (ref 19–32)
Chloride: 95 mEq/L — ABNORMAL LOW (ref 96–112)
Creatinine, Ser: 0.66 mg/dL (ref 0.50–1.10)
Glucose, Bld: 299 mg/dL — ABNORMAL HIGH (ref 70–99)
Potassium: 4 mEq/L (ref 3.5–5.1)

## 2011-09-11 MED ORDER — GLUCOSE BLOOD VI STRP: ORAL_STRIP | Status: AC

## 2011-09-11 MED ORDER — INFLUENZA VIRUS VACC SPLIT PF IM SUSP: 0.5000 mL | INTRAMUSCULAR | Status: DC

## 2011-09-11 MED ORDER — INSULIN NPH (HUMAN) (ISOPHANE) 100 UNIT/ML ~~LOC~~ SUSP: 20.0000 [IU] | Freq: Two times a day (BID) | SUBCUTANEOUS | Status: AC

## 2011-09-11 MED ORDER — INSULIN GLARGINE 100 UNIT/ML ~~LOC~~ SOLN: 24.0000 [IU] | Freq: Every day | SUBCUTANEOUS | Status: DC

## 2011-09-11 MED ORDER — INSULIN SYRINGES (DISPOSABLE) U-100 1 ML MISC: 1.0000 | Freq: Two times a day (BID) | Status: DC

## 2011-09-11 NOTE — Progress Notes (Signed)
Subjective: 51 year old female with history of asthma, hypertension, GERD presented to [facility] emergency room with worsening generalized weakness and feeling dehydrated for last several weeks.  She described a 4 week hx of polyuria and polydipsia.  At the time of my visit she is doing well.  She denies f/c, sob, n/v, or abdom pain.  Her CBGs remain very difficult to control.  Objective: Weight change: 2.493 kg (5 lb 7.9 oz)  Intake/Output Summary (Last 24 hours) at 09/11/11 0821 Last data filed at 09/10/11 1800  Gross per 24 hour  Intake   1215 ml  Output    600 ml  Net    615 ml   Blood pressure 125/93, pulse 93, temperature 98.2 F (36.8 C), temperature source Oral, resp. rate 18, height 5\' 6"  (1.676 m), weight 81.6 kg (179 lb 14.3 oz), SpO2 97.00%.  Physical Exam: General: No acute respiratory distress Lungs: Clear to auscultation bilaterally without wheezes or crackles Cardiovascular: Regular rate and rhythm without murmur gallop or rub normal S1 and S2 Abdomen: Nontender, nondistended, soft, bowel sounds positive, no rebound, no ascites, no appreciable mass Extremities: No significant cyanosis, clubbing, or edema bilateral lower extremities  Lab Results:  Basename 09/11/11 0656 09/09/11 2347 09/09/11 2014  NA 133* 139 138  K 4.0 3.2* 3.4*  CL 95* 101 100  CO2 22 28 28   GLUCOSE 299* 162* 248*  BUN 7 8 8   CREATININE 0.66 0.67 0.64  CALCIUM 9.2 9.0 8.9  MG 1.7 -- --  PHOS -- -- --    Basename 09/11/11 0656 09/10/11 0340 09/09/11 2014 09/09/11 1111  WBC 10.6* 11.2* 10.6* --  NEUTROABS -- -- -- 5.2  HGB 13.7 12.4 12.8 --  HCT 40.8 36.9 36.9 --  MCV 84.1 83.1 82.4 --  PLT 258 265 274 --    Basename 09/09/11 1420  HGBA1C 16.3*    Micro Results: Recent Results (from the past 240 hour(s))  URINE CULTURE     Status: Normal   Collection Time   09/09/11 10:43 AM      Component Value Range Status Comment   Specimen Description URINE, RANDOM   Final    Special  Requests NONE   Final    Culture  Setup Time [id number]   Final    Colony Count NO GROWTH   Final    Culture NO GROWTH   Final    Report Status 09/10/2011 FINAL   Final   MRSA PCR SCREENING     Status: Normal   Collection Time   09/09/11  4:20 PM      Component Value Range Status Comment   MRSA by PCR NEGATIVE  NEGATIVE  Final     Studies/Results: All recent x-ray/radiology reports have been reviewed in detail.   Medications: I have reviewed the patient's complete medication list.  Assessment/Plan:  DKA Resolved - AG now 10 w/ bicarb 28 - off insulin gtt   Newly diagnosed DM Education begun - CBG proving to be quite erratic - A1c noted to be 16.3 -  suspect insulin will be required at home - adjust regimen - will need CBG to be better controlled prior to d/c - resume Ace I (was taking at home) and add low dose ASA  Hyponatremia Pseudo due to hyperglycemia - resolved  Hyperkalemia/hypokalemia Due to DKA/cellular shift - now needs replacement - follow trend  HTN Controlled at present - follow   GERD  Asthma No evidence of acute exacerbation  Dispo Dc home  Lonia Blood [phone]

## 2011-09-11 NOTE — Progress Notes (Signed)
Pt going home today has watched all the diabetic videos, discharge instructions reviewed with patient. IV site removed with needle intact. Latesha Chesney,RN.

## 2011-09-11 NOTE — Discharge Summary (Signed)
Patient ID: Jean Melton MRN: [id number] DOB/AGE: June 15, 1961 50 y.o. Primary Care Physician: The [facility] Admit date: 09/09/2011 Discharge date: 09/11/2011    Discharge Diagnoses:   Principal Problem:  *Hyperosmolarity due to secondary diabetes Active Problems:  Diabetes mellitus, new onset  Hyponatremia  Hyperkalemia  HTN (hypertension)  Dehydration  GERD (gastroesophageal reflux disease)  Asthma   Medication List  As of 09/11/2011  5:48 PM   START taking these medications         glucose blood test strip   Use as instructed      insulin NPH 100 UNIT/ML injection   Commonly known as: HUMULIN N,NOVOLIN N   Inject 20 Units into the skin 2 (two) times daily.      Insulin Syringes (Disposable) U-100 1 ML Misc   1 Syringe by Does not apply route 2 (two) times daily.         CONTINUE taking these medications         cyclobenzaprine 5 MG tablet   Commonly known as: FLEXERIL      lisinopril-hydrochlorothiazide 20-12.5 MG per tablet   Commonly known as: PRINZIDE,ZESTORETIC      omeprazole 20 MG capsule   Commonly known as: PRILOSEC      solifenacin 5 MG tablet   Commonly known as: VESICARE          Where to get your medications    These are the prescriptions that you need to pick up.   You may get these medications from any pharmacy.         glucose blood test strip   insulin NPH 100 UNIT/ML injection   Insulin Syringes (Disposable) U-100 1 ML Misc            Discharged Condition:good    Consults:none  Significant Diagnostic Studies: No results found.  Lab Results: Results for orders placed during the hospital encounter of 09/09/11 (from the past 48 hour(s))  GLUCOSE, CAPILLARY     Status: Abnormal   Collection Time   09/09/11  6:33 PM      Component Value Range Comment   Glucose-Capillary 252 (*) 70 - 99 (mg/dL)   GLUCOSE, CAPILLARY     Status: Abnormal   Collection Time   09/09/11  7:38 PM      Component  Value Range Comment   Glucose-Capillary 255 (*) 70 - 99 (mg/dL)   BASIC METABOLIC PANEL     Status: Abnormal   Collection Time   09/09/11  8:14 PM      Component Value Range Comment   Sodium 138  135 - 145 (mEq/L)    Potassium 3.4 (*) 3.5 - 5.1 (mEq/L)    Chloride 100  96 - 112 (mEq/L)    CO2 28  19 - 32 (mEq/L)    Glucose, Bld 248 (*) 70 - 99 (mg/dL)    BUN 8  6 - 23 (mg/dL)    Creatinine, Ser 4.09  0.50 - 1.10 (mg/dL)    Calcium 8.9  8.4 - 10.5 (mg/dL)    GFR calc non Af Amer >90  >90 (mL/min)    GFR calc Af Amer >90  >90 (mL/min)   CBC     Status: Abnormal   Collection Time   09/09/11  8:14 PM      Component Value Range Comment   WBC 10.6 (*) 4.0 - 10.5 (K/uL)    RBC 4.48  3.87 - 5.11 (MIL/uL)    Hemoglobin  12.8  12.0 - 15.0 (g/dL)    HCT 62.9  52.8 - 41.3 (%)    MCV 82.4  78.0 - 100.0 (fL)    MCH 28.6  26.0 - 34.0 (pg)    MCHC 34.7  30.0 - 36.0 (g/dL)    RDW 24.4  01.0 - 27.2 (%)    Platelets 274  150 - 400 (K/uL)   GLUCOSE, CAPILLARY     Status: Abnormal   Collection Time   09/09/11  8:42 PM      Component Value Range Comment   Glucose-Capillary 213 (*) 70 - 99 (mg/dL)    Comment 1 Documented in Chart     GLUCOSE, CAPILLARY     Status: Abnormal   Collection Time   09/09/11  9:49 PM      Component Value Range Comment   Glucose-Capillary 172 (*) 70 - 99 (mg/dL)   GLUCOSE, CAPILLARY     Status: Abnormal   Collection Time   09/09/11 10:52 PM      Component Value Range Comment   Glucose-Capillary 173 (*) 70 - 99 (mg/dL)    Comment 1 Documented in Chart     BASIC METABOLIC PANEL     Status: Abnormal   Collection Time   09/09/11 11:47 PM      Component Value Range Comment   Sodium 139  135 - 145 (mEq/L)    Potassium 3.2 (*) 3.5 - 5.1 (mEq/L)    Chloride 101  96 - 112 (mEq/L)    CO2 28  19 - 32 (mEq/L)    Glucose, Bld 162 (*) 70 - 99 (mg/dL)    BUN 8  6 - 23 (mg/dL)    Creatinine, Ser 5.36  0.50 - 1.10 (mg/dL)    Calcium 9.0  8.4 - 10.5 (mg/dL)    GFR calc non Af Amer >90   >90 (mL/min)    GFR calc Af Amer >90  >90 (mL/min)   GLUCOSE, CAPILLARY     Status: Abnormal   Collection Time   09/09/11 11:55 PM      Component Value Range Comment   Glucose-Capillary 155 (*) 70 - 99 (mg/dL)    Comment 1 Documented in Chart      Comment 2 Notify RN     GLUCOSE, CAPILLARY     Status: Abnormal   Collection Time   09/10/11 12:45 AM      Component Value Range Comment   Glucose-Capillary 156 (*) 70 - 99 (mg/dL)   GLUCOSE, CAPILLARY     Status: Abnormal   Collection Time   09/10/11  2:03 AM      Component Value Range Comment   Glucose-Capillary 132 (*) 70 - 99 (mg/dL)    Comment 1 Documented in Chart      Comment 2 Notify RN     GLUCOSE, CAPILLARY     Status: Abnormal   Collection Time   09/10/11  3:16 AM      Component Value Range Comment   Glucose-Capillary 124 (*) 70 - 99 (mg/dL)    Comment 1 Documented in Chart     CBC     Status: Abnormal   Collection Time   09/10/11  3:40 AM      Component Value Range Comment   WBC 11.2 (*) 4.0 - 10.5 (K/uL)    RBC 4.44  3.87 - 5.11 (MIL/uL)    Hemoglobin 12.4  12.0 - 15.0 (g/dL)    HCT 64.4  03.4 - 74.2 (%)  MCV 83.1  78.0 - 100.0 (fL)    MCH 27.9  26.0 - 34.0 (pg)    MCHC 33.6  30.0 - 36.0 (g/dL)    RDW 16.1  09.6 - 04.5 (%)    Platelets 265  150 - 400 (K/uL)   GLUCOSE, CAPILLARY     Status: Abnormal   Collection Time   09/10/11  6:02 AM      Component Value Range Comment   Glucose-Capillary 236 (*) 70 - 99 (mg/dL)    Comment 1 Documented in Chart      Comment 2 Notify RN     GLUCOSE, CAPILLARY     Status: Abnormal   Collection Time   09/10/11  8:38 AM      Component Value Range Comment   Glucose-Capillary 244 (*) 70 - 99 (mg/dL)   GLUCOSE, CAPILLARY     Status: Abnormal   Collection Time   09/10/11 11:58 AM      Component Value Range Comment   Glucose-Capillary 345 (*) 70 - 99 (mg/dL)   GLUCOSE, CAPILLARY     Status: Abnormal   Collection Time   09/10/11  3:47 PM      Component Value Range Comment   Glucose-Capillary  518 (*) 70 - 99 (mg/dL)   GLUCOSE, CAPILLARY     Status: Abnormal   Collection Time   09/10/11  9:34 PM      Component Value Range Comment   Glucose-Capillary 298 (*) 70 - 99 (mg/dL)   BASIC METABOLIC PANEL     Status: Abnormal   Collection Time   09/11/11  6:56 AM      Component Value Range Comment   Sodium 133 (*) 135 - 145 (mEq/L)    Potassium 4.0  3.5 - 5.1 (mEq/L)    Chloride 95 (*) 96 - 112 (mEq/L)    CO2 22  19 - 32 (mEq/L)    Glucose, Bld 299 (*) 70 - 99 (mg/dL)    BUN 7  6 - 23 (mg/dL)    Creatinine, Ser 4.09  0.50 - 1.10 (mg/dL)    Calcium 9.2  8.4 - 10.5 (mg/dL)    GFR calc non Af Amer >90  >90 (mL/min)    GFR calc Af Amer >90  >90 (mL/min)   MAGNESIUM     Status: Normal   Collection Time   09/11/11  6:56 AM      Component Value Range Comment   Magnesium 1.7  1.5 - 2.5 (mg/dL)   CBC     Status: Abnormal   Collection Time   09/11/11  6:56 AM      Component Value Range Comment   WBC 10.6 (*) 4.0 - 10.5 (K/uL)    RBC 4.85  3.87 - 5.11 (MIL/uL)    Hemoglobin 13.7  12.0 - 15.0 (g/dL)    HCT 81.1  91.4 - 78.2 (%)    MCV 84.1  78.0 - 100.0 (fL)    MCH 28.2  26.0 - 34.0 (pg)    MCHC 33.6  30.0 - 36.0 (g/dL)    RDW 95.6  21.3 - 08.6 (%)    Platelets 258  150 - 400 (K/uL)   GLUCOSE, CAPILLARY     Status: Abnormal   Collection Time   09/11/11  7:56 AM      Component Value Range Comment   Glucose-Capillary 287 (*) 70 - 99 (mg/dL)   GLUCOSE, CAPILLARY     Status: Abnormal   Collection Time   09/11/11 11:55  AM      Component Value Range Comment   Glucose-Capillary 248 (*) 70 - 99 (mg/dL)    Recent Results (from the past 240 hour(s))  URINE CULTURE     Status: Normal   Collection Time   09/09/11 10:43 AM      Component Value Range Status Comment   Specimen Description URINE, RANDOM   Final    Special Requests NONE   Final    Culture  Setup Time [id number]   Final    Colony Count NO GROWTH   Final    Culture NO GROWTH   Final    Report Status 09/10/2011 FINAL   Final     MRSA PCR SCREENING     Status: Normal   Collection Time   09/09/11  4:20 PM      Component Value Range Status Comment   MRSA by PCR NEGATIVE  NEGATIVE  Final      Hospital Course:  Patient is a 51 year old female with history of asthma, hypertension, GERD presented to [facility] emergency room with worsening generalized weakness and feeling dehydrated for last several weeks. History was obtained from the patient and her daughter present in the room. Patient states that over the last 3-4 weeks she was noticing increasing frequency of urination, almost every hour and hence she was drinking a lot of fluids. She stated that she was initially drinking water which did not have them and she started drinking juices and ginger ale. She still felt generalized weakness and chapped lips. She felt dizzy and lightheaded but no syncopal episodes. Patient and her daughter came to the ER for checkup and was found to have blood glucose in 1300's, anion gap of 16 with positive ketones.  She was basically diagnosed with hyperosmolar, nonketotic syndrome. I think that the positive ketones and minor anion gap were due to the major abnormalities rather than patient having type 1 diabetes mellitus. The patient was treated with intravenous saline intravenous insulin, with reduction in her hyperglycemia, closure of the anion gap. Of note the patient's bicarbonate was always above 20 during this entire admission. Later during the admission the hemoglobin A1c resulted back at 16.3 Due to the fact that the patient has new onset uncontrolled diabetes mellitus we opted to treat her with insulin as an outpatient. The patient doesn't have insurance and she doesn't have means to purchase branded insulin. We'll discharge her home on twice a day insulin NPH, educate her thoroughly about the importance of regular medical checkups and self-monitoring of her glucose. The patient will call and follow up with [facility]  Discharge  Exam: Blood pressure 125/93, pulse 93, temperature 98.2 F (36.8 C), temperature source Oral, resp. rate 18, height 5\' 6"  (1.676 m), weight 81.6 kg (179 lb 14.3 oz), SpO2 97.00%. Alert and oriented x3 Chest clear to auscultation without wheeze rhonchi crackles Heart regular rate and rhythm without murmurs rubs or gallops  Disposition: Home  Discharge Orders    Future Orders Please Complete By Expires   Diet Carb Modified      Increase activity slowly         Follow-up Information    Please follow up. ([facility])          Signed: Fortune Brannigan 09/11/2011, 5:48 PM

## 2011-09-11 NOTE — Progress Notes (Signed)
   CARE MANAGEMENT NOTE 09/11/2011  Patient:  Jean Melton, Jean Melton   Account Number:  [id number]  Date Initiated:  09/09/2011  Documentation initiated by:  Junius Creamer  Subjective/Objective Assessment:   adm w elev blood sugar     Action/Plan:   lives alone, no pcp   Anticipated DC Date:  09/11/2011   Anticipated DC Plan:  HOME/SELF CARE      DC Planning Services  CM consult  Medication Assistance      Choice offered to / List presented to:             Status of service:  Completed, signed off Medicare Important Message given?   (If response is "NO", the following Medicare IM given date fields will be blank) Date Medicare IM given:   Date Additional Medicare IM given:    Discharge Disposition:  HOME/SELF CARE  Per UR Regulation:    Comments:  09/11/2011 1430 Contacted main pharmacy and pt is eligible for [employer] meds assistance fund that provides 3 day supply of her meds. Explained to pt that fund can be used once per yr. Isidoro Donning RN CCM Case Mgmt phone [phone]  3/8 gave pt inorm on [facility]. [facility] not taking new pt's at present. [facility] has pt call for elig then they give appt. gave inform to pt to call for elidg. also have call into [facility]. they are ck to see if they can see pt. i told pt if dc over weekend and i hear about appt i will call her. will ask weekend cm to ck on pt and assist w some meds if disch. debbie dowell rn,bsn [id number]

## 2011-11-04 ENCOUNTER — Other Ambulatory Visit (HOSPITAL_COMMUNITY)
Admission: RE | Admit: 2011-11-04 | Discharge: 2011-11-04 | Disposition: A | Discharge status: Home / Self Care | Payer: Self-pay | Source: Ambulatory Visit | Attending: Family Medicine | Admitting: Family Medicine

## 2011-11-04 DIAGNOSIS — Z01419 Encounter for gynecological examination (general) (routine) without abnormal findings: Secondary | ICD-10-CM | POA: Insufficient documentation

## 2011-11-11 ENCOUNTER — Other Ambulatory Visit (HOSPITAL_COMMUNITY): Payer: Self-pay | Admitting: Family Medicine

## 2011-11-11 DIAGNOSIS — Z1231 Encounter for screening mammogram for malignant neoplasm of breast: Secondary | ICD-10-CM

## 2011-12-08 ENCOUNTER — Ambulatory Visit (HOSPITAL_COMMUNITY)
Admission: RE | Admit: 2011-12-08 | Discharge: 2011-12-08 | Disposition: A | Discharge status: Home / Self Care | Payer: Self-pay | Source: Ambulatory Visit | Attending: Family Medicine | Admitting: Family Medicine

## 2011-12-08 DIAGNOSIS — Z1231 Encounter for screening mammogram for malignant neoplasm of breast: Secondary | ICD-10-CM | POA: Insufficient documentation

## 2011-12-16 ENCOUNTER — Other Ambulatory Visit: Payer: Self-pay | Admitting: Family Medicine

## 2011-12-16 DIAGNOSIS — R928 Other abnormal and inconclusive findings on diagnostic imaging of breast: Secondary | ICD-10-CM

## 2011-12-26 ENCOUNTER — Ambulatory Visit
Admission: RE | Admit: 2011-12-26 | Discharge: 2011-12-26 | Disposition: A | Discharge status: Home / Self Care | Payer: Self-pay | Source: Ambulatory Visit | Attending: Family Medicine | Admitting: Family Medicine

## 2011-12-26 DIAGNOSIS — R928 Other abnormal and inconclusive findings on diagnostic imaging of breast: Secondary | ICD-10-CM

## 2012-02-02 ENCOUNTER — Encounter (HOSPITAL_COMMUNITY)
Admission: RE | Disposition: A | Discharge status: Home / Self Care | Payer: Self-pay | Source: Ambulatory Visit | Attending: Gastroenterology

## 2012-02-02 ENCOUNTER — Encounter (HOSPITAL_COMMUNITY): Payer: Self-pay | Admitting: *Deleted

## 2012-02-02 ENCOUNTER — Ambulatory Visit (HOSPITAL_COMMUNITY)
Admission: RE | Admit: 2012-02-02 | Discharge: 2012-02-02 | Disposition: A | Discharge status: Home / Self Care | Payer: Self-pay | Source: Ambulatory Visit | Attending: Gastroenterology | Admitting: Gastroenterology

## 2012-02-02 DIAGNOSIS — Z1211 Encounter for screening for malignant neoplasm of colon: Secondary | ICD-10-CM | POA: Insufficient documentation

## 2012-02-02 DIAGNOSIS — K219 Gastro-esophageal reflux disease without esophagitis: Secondary | ICD-10-CM | POA: Insufficient documentation

## 2012-02-02 DIAGNOSIS — I10 Essential (primary) hypertension: Secondary | ICD-10-CM | POA: Insufficient documentation

## 2012-02-02 DIAGNOSIS — J45909 Unspecified asthma, uncomplicated: Secondary | ICD-10-CM | POA: Insufficient documentation

## 2012-02-02 DIAGNOSIS — Z79899 Other long term (current) drug therapy: Secondary | ICD-10-CM | POA: Insufficient documentation

## 2012-02-02 HISTORY — PX: COLONOSCOPY: SHX5424

## 2012-02-02 LAB — GLUCOSE, CAPILLARY: Glucose-Capillary: 126 mg/dL — ABNORMAL HIGH (ref 70–99)

## 2012-02-02 SURGERY — COLONOSCOPY
Anesthesia: Moderate Sedation

## 2012-02-02 MED ORDER — DIPHENHYDRAMINE HCL 50 MG/ML IJ SOLN
INTRAMUSCULAR | Status: AC
  Filled 2012-02-02: qty 1

## 2012-02-02 MED ORDER — SODIUM CHLORIDE 0.9 % IV SOLN
Freq: Once | INTRAVENOUS | Status: AC
  Administered 2012-02-02: 500 mL via INTRAVENOUS

## 2012-02-02 MED ORDER — FENTANYL CITRATE 0.05 MG/ML IJ SOLN
INTRAMUSCULAR | Status: DC | PRN
  Administered 2012-02-02 (×5): 25 ug via INTRAVENOUS

## 2012-02-02 MED ORDER — MIDAZOLAM HCL 5 MG/5ML IJ SOLN
INTRAMUSCULAR | Status: DC | PRN
  Administered 2012-02-02 (×5): 2 mg via INTRAVENOUS

## 2012-02-02 MED ORDER — MIDAZOLAM HCL 10 MG/2ML IJ SOLN
INTRAMUSCULAR | Status: AC
  Filled 2012-02-02: qty 4

## 2012-02-02 MED ORDER — FENTANYL CITRATE 0.05 MG/ML IJ SOLN
INTRAMUSCULAR | Status: AC
  Filled 2012-02-02: qty 4

## 2012-02-02 NOTE — H&P (Signed)
  Subjective:   Patient is a 51 y.o. female presents with need for screening colonoscopy. She is a patient who has diabetes takes insulin and metformin. She has a history of asthma. No family history of colon cancer.. Procedure including risks and benefits discussed in office.  Patient Active Problem List   Diagnosis Date Noted  . Hyperosmolarity due to secondary diabetes 09/11/2011  . Diabetes mellitus, new onset 09/09/2011  . Hyponatremia 09/09/2011  . Hyperkalemia 09/09/2011  . HTN (hypertension) 09/09/2011  . Dehydration 09/09/2011  . GERD (gastroesophageal reflux disease) 09/09/2011  . Asthma 09/09/2011   Past Medical History  Diagnosis Date  . Asthma   . Hypertension   . GERD (gastroesophageal reflux disease)   . Depression   . Diabetes mellitus     Past Surgical History  Procedure Date  . Cesarian   . Cholecystectomy   . Abdominal surgery   . Abdominal hysterectomy     Prescriptions prior to admission  Medication Sig Dispense Refill  . cyclobenzaprine (FLEXERIL) 5 MG tablet Take 5 mg by mouth 3 (three) times daily as needed. For muscle spasms      . lisinopril-hydrochlorothiazide (PRINZIDE,ZESTORETIC) 20-12.5 MG per tablet Take 1 tablet by mouth daily.        Marland Kitchen omeprazole (PRILOSEC) 20 MG capsule Take 20 mg by mouth daily.      Marland Kitchen oxybutynin (DITROPAN-XL) 10 MG 24 hr tablet Take 10 mg by mouth daily.      . solifenacin (VESICARE) 5 MG tablet Take 10 mg by mouth daily.      Marland Kitchen glucose blood test strip Use as instructed  100 each  12  . insulin NPH (HUMULIN N) 100 UNIT/ML injection Inject 20 Units into the skin 2 (two) times daily.  1 vial  12  . Insulin Syringes, Disposable, U-100 1 ML MISC 1 Syringe by Does not apply route 2 (two) times daily.  60 each  0   No Known Allergies  History  Substance Use Topics  . Smoking status: Never Smoker   . Smokeless tobacco: Never Used  . Alcohol Use: No    History reviewed. No pertinent family history.   Objective:    Patient Vitals for the past 8 hrs:  BP Temp Resp SpO2 Height  02/02/12 0804 141/97 mmHg 98.1 F (36.7 C) 19  99 % 5\' 6"  (1.676 m)         See MD Preop evaluation      Assessment:   Active Problems:  * No active hospital problems. *  1. Need for colon cancer screening  Plan:   We'll proceed at this time with colonoscopy as outpatient with plans to discharge patient home after the procedure

## 2012-02-02 NOTE — Op Note (Signed)
[facility] [address] [place], [place]  [address]  COLONOSCOPY PROCEDURE REPORT  PATIENT:  Jean Melton, Jean Melton  MR#:  [id number] BIRTHDATE:  Mar 06, 1961, 50 yrs. old  GENDER:  female ENDOSCOPIST:  Carman Ching REF. BY:  Deatra Marleigh Kaylor, M.D. PROCEDURE DATE:  02/02/2012 PROCEDURE:  Colonoscopy 65784 ASA CLASS:  Class II INDICATIONS:  Colorectal cancer screening, average risk MEDICATIONS:   Fentanyl 125 mcg IV, Versed 10 mg IV  DESCRIPTION OF PROCEDURE:   After the risks and benefits and of the procedure were explained, informed consent was obtained. Digital rectal exam was performed and revealed no abnormalities. The Pentax Colonoscope [id number] endoscope was introduced through the anus and advanced to the cecum.  The quality of the prep was good..  The instrument was then slowly withdrawn as the colon was fully examined. <<PROCEDUREIMAGES>>  FINDINGS:  normal colon otherwise. no polyps, masses, or diverticula   Retroflexed views in the rectum revealed no abnormalities.    The scope was then withdrawn from the patient and the procedure completed.  COMPLICATIONS:  A complication of none occured on 02/02/2012 at. ENDOSCOPIC IMPRESSION: 1) Normal RECOMMENDATIONS: 1) Begin yearly stool checks for blood in 3 years and repeat colonoscopy in 10 years.  REPEAT EXAM:  In 10 year(s) for Colonoscopy.  ______________________________ Carman Ching  CC:  Reggie Pile, MD  n. Rosalie DoctorCarman Ching at 02/02/2012 09:13 AM  Barney Drain, [phone]

## 2012-02-03 ENCOUNTER — Encounter (HOSPITAL_COMMUNITY): Payer: Self-pay

## 2012-02-03 ENCOUNTER — Encounter (HOSPITAL_COMMUNITY): Payer: Self-pay | Admitting: Gastroenterology

## 2012-07-19 ENCOUNTER — Other Ambulatory Visit: Payer: Self-pay | Admitting: Family Medicine

## 2012-07-19 DIAGNOSIS — R921 Mammographic calcification found on diagnostic imaging of breast: Secondary | ICD-10-CM

## 2012-08-14 ENCOUNTER — Ambulatory Visit
Admission: RE | Admit: 2012-08-14 | Discharge: 2012-08-14 | Disposition: A | Discharge status: Home / Self Care | Payer: Self-pay | Source: Ambulatory Visit | Attending: Family Medicine | Admitting: Family Medicine

## 2012-08-14 DIAGNOSIS — R921 Mammographic calcification found on diagnostic imaging of breast: Secondary | ICD-10-CM

## 2013-01-14 ENCOUNTER — Other Ambulatory Visit (HOSPITAL_COMMUNITY): Payer: Self-pay | Admitting: Family Medicine

## 2013-01-14 ENCOUNTER — Other Ambulatory Visit: Payer: Self-pay | Admitting: Family Medicine

## 2013-01-14 DIAGNOSIS — Z1231 Encounter for screening mammogram for malignant neoplasm of breast: Secondary | ICD-10-CM

## 2013-01-14 DIAGNOSIS — R921 Mammographic calcification found on diagnostic imaging of breast: Secondary | ICD-10-CM

## 2013-03-06 ENCOUNTER — Ambulatory Visit
Admission: RE | Admit: 2013-03-06 | Discharge: 2013-03-06 | Disposition: A | Discharge status: Home / Self Care | Payer: No Typology Code available for payment source | Source: Ambulatory Visit | Attending: Family Medicine | Admitting: Family Medicine

## 2013-03-06 DIAGNOSIS — R921 Mammographic calcification found on diagnostic imaging of breast: Secondary | ICD-10-CM

## 2013-08-12 ENCOUNTER — Ambulatory Visit: Payer: Medicaid Other | Admitting: Podiatry

## 2013-10-08 DIAGNOSIS — M25522 Pain in left elbow: Secondary | ICD-10-CM | POA: Insufficient documentation

## 2013-10-16 ENCOUNTER — Ambulatory Visit (INDEPENDENT_AMBULATORY_CARE_PROVIDER_SITE_OTHER): Payer: Medicaid Other | Admitting: Podiatry

## 2013-10-16 ENCOUNTER — Encounter: Payer: Self-pay | Admitting: Podiatry

## 2013-10-16 VITALS — BP 146/89 | HR 88 | Resp 16 | Ht 66.0 in | Wt 199.0 lb

## 2013-10-16 DIAGNOSIS — E119 Type 2 diabetes mellitus without complications: Secondary | ICD-10-CM

## 2013-10-16 DIAGNOSIS — E1149 Type 2 diabetes mellitus with other diabetic neurological complication: Secondary | ICD-10-CM

## 2013-10-16 DIAGNOSIS — L259 Unspecified contact dermatitis, unspecified cause: Secondary | ICD-10-CM

## 2013-10-16 NOTE — Progress Notes (Signed)
   Subjective:    Patient ID: Jean Melton, female    DOB: 1961-04-20, 53 y.o.   MRN: [id number]  HPI Comments: N foot pain L B/L arches D 1 year O on and off C dull pain A walking T ankle brace OTC  Diabetes Associated symptoms include polyuria.  Foot Pain    This patient relates an approximately one-year history of pain in the arch area the right and left feet that occurs approximately once a month, last for several hours and resolve without treatment.    Review of Systems  Eyes:       Cataracts surgery  Endocrine: Positive for polyuria.  Genitourinary: Positive for urgency.  All other systems reviewed and are negative.      Objective:   Physical Exam Orientated x3 space female  Vascular: DP and PT pulses 2/4 bilaterally  Neurological: Sensation to 10 g monofilament wire intact 5/5 bilaterally. Vibratory sensation intact bilaterally. Ankle reflexes reactive bilaterally.  Dermatological: An oval-shaped scaling macular lesion 25 x 15 mm noted on the dorsal aspect of the right foot (present for 1 month)  Musculoskeletal: There is no restriction ankle, subtalar, midtarsal, metatarsal phalangeal joints bilaterally. Bilateral HAV deformities noted. There are no areas of palpable tenderness in the left foot with special attention to the plantar fascial area bilaterally.       Assessment & Plan:   Assessment: Satisfactory neurovascular status Diabetes without complications Dermatitis dorsum the right foot  Plan: Patient is given General Information about diabetic footcare and advised to return if her arch pain becomes more persistent area  Patient advised to apply 1% hydrocortisone cream to rash on dorsum of right foot 3 times a day x30 days. She is advised that if lesion persist to contact dermatologist for further evaluation.  Reappoint times 1 year for diabetic foot evaluation

## 2013-10-16 NOTE — Patient Instructions (Signed)
Use over-the-counter 1% hydrocortisone cream and apply 3 times a day to rash on right foot x30 days. If it does not respond contact dermatologist for followup.  Diabetes and Foot Care Diabetes may cause you to have problems because of poor blood supply (circulation) to your feet and legs. This may cause the skin on your feet to become thinner, break easier, and heal more slowly. Your skin may become dry, and the skin may peel and crack. You may also have nerve damage in your legs and feet causing decreased feeling in them. You may not notice minor injuries to your feet that could lead to infections or more serious problems. Taking care of your feet is one of the most important things you can do for yourself.  HOME CARE INSTRUCTIONS  Wear shoes at all times, even in the house. Do not go barefoot. Bare feet are easily injured.  Check your feet daily for blisters, cuts, and redness. If you cannot see the bottom of your feet, use a mirror or ask someone for help.  Wash your feet with warm water (do not use hot water) and mild soap. Then pat your feet and the areas between your toes until they are completely dry. Do not soak your feet as this can dry your skin.  Apply a moisturizing lotion or petroleum jelly (that does not contain alcohol and is unscented) to the skin on your feet and to dry, brittle toenails. Do not apply lotion between your toes.  Trim your toenails straight across. Do not dig under them or around the cuticle. File the edges of your nails with an emery board or nail file.  Do not cut corns or calluses or try to remove them with medicine.  Wear clean socks or stockings every day. Make sure they are not too tight. Do not wear knee-high stockings since they may decrease blood flow to your legs.  Wear shoes that fit properly and have enough cushioning. To break in new shoes, wear them for just a few hours a day. This prevents you from injuring your feet. Always look in your shoes before  you put them on to be sure there are no objects inside.  Do not cross your legs. This may decrease the blood flow to your feet.  If you find a minor scrape, cut, or break in the skin on your feet, keep it and the skin around it clean and dry. These areas may be cleansed with mild soap and water. Do not cleanse the area with peroxide, alcohol, or iodine.  When you remove an adhesive bandage, be sure not to damage the skin around it.  If you have a wound, look at it several times a day to make sure it is healing.  Do not use heating pads or hot water bottles. They may burn your skin. If you have lost feeling in your feet or legs, you may not know it is happening until it is too late.  Make sure your health care provider performs a complete foot exam at least annually or more often if you have foot problems. Report any cuts, sores, or bruises to your health care provider immediately. SEEK MEDICAL CARE IF:   You have an injury that is not healing.  You have cuts or breaks in the skin.  You have an ingrown nail.  You notice redness on your legs or feet.  You feel burning or tingling in your legs or feet.  You have pain or cramps in  your legs and feet.  Your legs or feet are numb.  Your feet always feel cold. SEEK IMMEDIATE MEDICAL CARE IF:   There is increasing redness, swelling, or pain in or around a wound.  There is a red line that goes up your leg.  Pus is coming from a wound.  You develop a fever or as directed by your health care provider.  You notice a bad smell coming from an ulcer or wound. Document Released: 06/17/2000 Document Revised: 02/20/2013 Document Reviewed: 11/27/2012 [facility] Patient Information 2014 [place], [place].

## 2013-10-17 ENCOUNTER — Encounter: Payer: Self-pay | Admitting: Podiatry

## 2014-01-27 ENCOUNTER — Ambulatory Visit: Payer: Medicaid Other | Attending: Orthopedic Surgery | Admitting: Occupational Therapy

## 2014-01-27 DIAGNOSIS — IMO0001 Reserved for inherently not codable concepts without codable children: Secondary | ICD-10-CM | POA: Insufficient documentation

## 2014-01-27 DIAGNOSIS — M25649 Stiffness of unspecified hand, not elsewhere classified: Secondary | ICD-10-CM | POA: Insufficient documentation

## 2014-01-27 DIAGNOSIS — M6281 Muscle weakness (generalized): Secondary | ICD-10-CM | POA: Diagnosis not present

## 2014-01-27 DIAGNOSIS — M24649 Ankylosis, unspecified hand: Secondary | ICD-10-CM | POA: Insufficient documentation

## 2014-03-05 ENCOUNTER — Other Ambulatory Visit: Payer: Self-pay

## 2014-03-05 DIAGNOSIS — Z1231 Encounter for screening mammogram for malignant neoplasm of breast: Secondary | ICD-10-CM

## 2014-03-20 ENCOUNTER — Ambulatory Visit
Admission: RE | Admit: 2014-03-20 | Discharge: 2014-03-20 | Disposition: A | Discharge status: Home / Self Care | Payer: Medicaid Other | Source: Ambulatory Visit

## 2014-03-20 DIAGNOSIS — Z1231 Encounter for screening mammogram for malignant neoplasm of breast: Secondary | ICD-10-CM

## 2014-03-21 ENCOUNTER — Other Ambulatory Visit: Payer: Self-pay | Admitting: Family

## 2014-03-21 ENCOUNTER — Ambulatory Visit
Admission: RE | Admit: 2014-03-21 | Discharge: 2014-03-21 | Disposition: A | Discharge status: Home / Self Care | Payer: Medicaid Other | Source: Ambulatory Visit | Attending: Family | Admitting: Family

## 2014-03-21 DIAGNOSIS — M25551 Pain in right hip: Secondary | ICD-10-CM

## 2014-03-21 DIAGNOSIS — R21 Rash and other nonspecific skin eruption: Secondary | ICD-10-CM | POA: Insufficient documentation

## 2014-03-26 ENCOUNTER — Ambulatory Visit (INDEPENDENT_AMBULATORY_CARE_PROVIDER_SITE_OTHER): Payer: Medicaid Other | Admitting: Podiatry

## 2014-03-26 ENCOUNTER — Encounter: Payer: Self-pay | Admitting: Podiatry

## 2014-03-26 VITALS — BP 149/72 | HR 88 | Resp 12

## 2014-03-26 DIAGNOSIS — M2010 Hallux valgus (acquired), unspecified foot: Secondary | ICD-10-CM

## 2014-03-26 NOTE — Progress Notes (Signed)
   Subjective:    Patient ID: Jean Melton, female    DOB: 29-Mar-1961, 53 y.o.   MRN: [id number]  HPI  NEW PROBLEM:  N-DRY SKIN, CALLUSES L-B/L BALL OF THE FOOT AND HEELS D-1 YEAR O-SLOWLY C-WORSE A-PRESSURE T-NONE  This patient presents today concerned about callusing on her feet and her general dry skin. She is requesting a foot examination and she is a diabetic and wants further advice. She was last seen on 10/16/2013 at that times he skin around and was noted and patient had followup with dermatologist and was diagnosed with fungal infection and is being treated by dermatologist.  Patient was clarification about pedicures in general foot care as well  Review of Systems  Allergic/Immunologic: Positive for environmental allergies.  Hematological: Bruises/bleeds easily.  All other systems reviewed and are negative.      Objective:   Physical Exam  Orientated x3 black female  Vascular: DP and PT pulses 2/4 bilaterally  Neurological: Sensation to 10 g monofilament wire intact 5/5 bilaterally Vibratory sensation intact bilaterally Ankle reflex equal reactive bilaterally  Dermatological: Dorsal right midfoot he has dry scaling area under treatment for mycotic infection by dermatologist, per patient Dorsal aspect of left mid foot has several dry scaling areas under treatment by dermatologist for mycotic infection per patient  The toenails are normal trophic The plantar skin texture and turgor within normal limits without any callus formation or skin lesions, bilaterally  Musculoskeletal: HAV deformities noted bilaterally     Assessment & Plan:   Assessment: Satisfactory neurovascular status Tinea pedis under treatment by dermatologist with unknown topical medication Plantar skin texture and turgor within normal limits Toenails are normal trophic HAV deformities bilaterally  Plan: Continue dermatology treatment as prescribed Patient advised that at the  plantar skin was normal in appearance and just use a general all-purpose skin moisturizer on a daily basis.  I advised patient not to allow pedicures to soak her feet and to just trim the distal nail plates. I also advised her not to allow the pedicurist  to manipulate the cuticle area. Also, advised patient to confirm with pedicurist that the instruments were sterile with steam and pressure.  Reappoint at yearly intervals or at patient's request

## 2014-03-26 NOTE — Patient Instructions (Signed)
Diabetes and Foot Care Diabetes may cause you to have problems because of poor blood supply (circulation) to your feet and legs. This may cause the skin on your feet to become thinner, break easier, and heal more slowly. Your skin may become dry, and the skin may peel and crack. You may also have nerve damage in your legs and feet causing decreased feeling in them. You may not notice minor injuries to your feet that could lead to infections or more serious problems. Taking care of your feet is one of the most important things you can do for yourself.  HOME CARE INSTRUCTIONS  Wear shoes at all times, even in the house. Do not go barefoot. Bare feet are easily injured.  Check your feet daily for blisters, cuts, and redness. If you cannot see the bottom of your feet, use a mirror or ask someone for help.  Wash your feet with warm water (do not use hot water) and mild soap. Then pat your feet and the areas between your toes until they are completely dry. Do not soak your feet as this can dry your skin.  Apply a moisturizing lotion or petroleum jelly (that does not contain alcohol and is unscented) to the skin on your feet and to dry, brittle toenails. Do not apply lotion between your toes.  Trim your toenails straight across. Do not dig under them or around the cuticle. File the edges of your nails with an emery board or nail file.  Do not cut corns or calluses or try to remove them with medicine.  Wear clean socks or stockings every day. Make sure they are not too tight. Do not wear knee-high stockings since they may decrease blood flow to your legs.  Wear shoes that fit properly and have enough cushioning. To break in new shoes, wear them for just a few hours a day. This prevents you from injuring your feet. Always look in your shoes before you put them on to be sure there are no objects inside.  Do not cross your legs. This may decrease the blood flow to your feet.  If you find a minor scrape,  cut, or break in the skin on your feet, keep it and the skin around it clean and dry. These areas may be cleansed with mild soap and water. Do not cleanse the area with peroxide, alcohol, or iodine.  When you remove an adhesive bandage, be sure not to damage the skin around it.  If you have a wound, look at it several times a day to make sure it is healing.  Do not use heating pads or hot water bottles. They may burn your skin. If you have lost feeling in your feet or legs, you may not know it is happening until it is too late.  Make sure your health care provider performs a complete foot exam at least annually or more often if you have foot problems. Report any cuts, sores, or bruises to your health care provider immediately. SEEK MEDICAL CARE IF:   You have an injury that is not healing.  You have cuts or breaks in the skin.  You have an ingrown nail.  You notice redness on your legs or feet.  You feel burning or tingling in your legs or feet.  You have pain or cramps in your legs and feet.  Your legs or feet are numb.  Your feet always feel cold. SEEK IMMEDIATE MEDICAL CARE IF:   There is increasing redness,   swelling, or pain in or around a wound.  There is a red line that goes up your leg.  Pus is coming from a wound.  You develop a fever or as directed by your health care provider.  You notice a bad smell coming from an ulcer or wound. Document Released: 06/17/2000 Document Revised: 02/20/2013 Document Reviewed: 11/27/2012 [employer] Patient Information 2015 [employer]. This information is not intended to replace advice given to you by your health care provider. Make sure you discuss any questions you have with your health care provider.  

## 2014-03-27 ENCOUNTER — Encounter: Payer: Self-pay | Admitting: Podiatry

## 2014-06-04 ENCOUNTER — Ambulatory Visit (INDEPENDENT_AMBULATORY_CARE_PROVIDER_SITE_OTHER): Payer: Medicaid Other | Admitting: Podiatry

## 2014-06-04 ENCOUNTER — Encounter: Payer: Self-pay | Admitting: Podiatry

## 2014-06-04 VITALS — BP 121/82 | HR 84 | Resp 12

## 2014-06-04 DIAGNOSIS — S96919A Strain of unspecified muscle and tendon at ankle and foot level, unspecified foot, initial encounter: Secondary | ICD-10-CM

## 2014-06-04 NOTE — Patient Instructions (Signed)
Ankle soreness most likely from too much physical activity In the gym use the exercise bike or rowing machine or water aerobics   Do not use Treadmill Elliptical Stepper

## 2014-06-04 NOTE — Progress Notes (Signed)
   Subjective:    Patient ID: Jean Melton, female    DOB: 1960/07/07, 53 y.o.   MRN: [id number]  HPI NEW PROBLEM:  N-SORE,PAINFUL L-B/L ANKLE D-2 WEEKS O-SUDDENLY C-WORSE A-WALKING T-ALEVE Patient began walking program 1 mile 3 times a week 3 weeks. She noticed pain and snapping in the lateral ankles right more than left as result of a walking program and discontinue the walking, however, still relates some discomfort in the ankles right more than left. Patient has been primarily sedentary to this activity. Patient denies any other areas of joint pain.  Review of Systems  Musculoskeletal: Positive for gait problem.       Objective:   Physical Exam  Orientated 3  Vascular: DP and PT pulses 2/4 bilaterally No edema noted bilaterally  Neurological: Sensation to 10 g monofilament wire intact 5/5 bilaterally Vibratory sensation intact bilaterally Ankle reflex equal and reactive bilaterally  Dermatological: Toenails demonstrates a mild incurvation and dystrophic changes Texture and turgor are normal limits bilaterally  Musculoskeletal: No restriction in motion or crepitus in the ankle, subtalar, midtarsal, midtarsal and metatarsophalangeal joints bilaterally Patient is able to stand on her toes and on her heels unilaterally and bilaterally without difficulty Stable gait pattern Palpation in the ankle, rear foot elicits no palpable tenderness or palpable lesions      Assessment & Plan:   Assessment: Strain ankles bilaterally associated with gym activity of walking  Plan: DC walking or exercise until pain subsides completely  Substitute the following activities in the gym: Exercise bike Rowing machine Water aerobics Exercise machines only in a seated position  Reappoint at patient's request

## 2014-06-24 DIAGNOSIS — L439 Lichen planus, unspecified: Secondary | ICD-10-CM | POA: Insufficient documentation

## 2014-10-07 DIAGNOSIS — L821 Other seborrheic keratosis: Secondary | ICD-10-CM | POA: Insufficient documentation

## 2014-10-16 DIAGNOSIS — N3946 Mixed incontinence: Secondary | ICD-10-CM | POA: Insufficient documentation

## 2014-11-27 ENCOUNTER — Encounter (HOSPITAL_COMMUNITY): Payer: Self-pay | Admitting: Emergency Medicine

## 2014-11-27 ENCOUNTER — Emergency Department (HOSPITAL_COMMUNITY)
Admission: EM | Admit: 2014-11-27 | Discharge: 2014-11-27 | Disposition: A | Discharge status: Home / Self Care | Payer: Medicaid Other | Attending: Emergency Medicine | Admitting: Emergency Medicine

## 2014-11-27 DIAGNOSIS — E119 Type 2 diabetes mellitus without complications: Secondary | ICD-10-CM | POA: Insufficient documentation

## 2014-11-27 DIAGNOSIS — Z79899 Other long term (current) drug therapy: Secondary | ICD-10-CM | POA: Insufficient documentation

## 2014-11-27 DIAGNOSIS — N390 Urinary tract infection, site not specified: Secondary | ICD-10-CM | POA: Diagnosis not present

## 2014-11-27 DIAGNOSIS — E785 Hyperlipidemia, unspecified: Secondary | ICD-10-CM | POA: Insufficient documentation

## 2014-11-27 DIAGNOSIS — Z8659 Personal history of other mental and behavioral disorders: Secondary | ICD-10-CM | POA: Insufficient documentation

## 2014-11-27 DIAGNOSIS — J45909 Unspecified asthma, uncomplicated: Secondary | ICD-10-CM | POA: Diagnosis not present

## 2014-11-27 DIAGNOSIS — R197 Diarrhea, unspecified: Secondary | ICD-10-CM | POA: Diagnosis not present

## 2014-11-27 DIAGNOSIS — I10 Essential (primary) hypertension: Secondary | ICD-10-CM | POA: Insufficient documentation

## 2014-11-27 DIAGNOSIS — K219 Gastro-esophageal reflux disease without esophagitis: Secondary | ICD-10-CM | POA: Diagnosis not present

## 2014-11-27 DIAGNOSIS — Z7982 Long term (current) use of aspirin: Secondary | ICD-10-CM | POA: Insufficient documentation

## 2014-11-27 DIAGNOSIS — R1031 Right lower quadrant pain: Secondary | ICD-10-CM | POA: Diagnosis present

## 2014-11-27 LAB — COMPREHENSIVE METABOLIC PANEL
ALBUMIN: 4 g/dL (ref 3.5–5.0)
ALT: 13 U/L — AB (ref 14–54)
ANION GAP: 10 (ref 5–15)
AST: 12 U/L — ABNORMAL LOW (ref 15–41)
Alkaline Phosphatase: 53 U/L (ref 38–126)
BILIRUBIN TOTAL: 0.3 mg/dL (ref 0.3–1.2)
BUN: 14 mg/dL (ref 6–20)
CALCIUM: 9.8 mg/dL (ref 8.9–10.3)
CO2: 29 mmol/L (ref 22–32)
Chloride: 101 mmol/L (ref 101–111)
Creatinine, Ser: 0.73 mg/dL (ref 0.44–1.00)
GFR calc Af Amer: >60 mL/min (ref >60)
Glucose, Bld: 112 mg/dL — ABNORMAL HIGH (ref 65–99)
Potassium: 3.4 mmol/L — ABNORMAL LOW (ref 3.5–5.1)
SODIUM: 140 mmol/L (ref 135–145)
TOTAL PROTEIN: 8.1 g/dL (ref 6.5–8.1)

## 2014-11-27 LAB — CBC WITH DIFFERENTIAL/PLATELET
BASOS ABS: 0 10*3/uL (ref 0.0–0.1)
BASOS PCT: 0 % (ref 0–1)
EOS PCT: 1 % (ref 0–5)
Eosinophils Absolute: 0.2 10*3/uL (ref 0.0–0.7)
HEMATOCRIT: 38.7 % (ref 36.0–46.0)
HEMOGLOBIN: 12.1 g/dL (ref 12.0–15.0)
LYMPHS ABS: 3.2 10*3/uL (ref 0.7–4.0)
Lymphocytes Relative: 21 % (ref 12–46)
MCH: 26.4 pg (ref 26.0–34.0)
MCHC: 31.3 g/dL (ref 30.0–36.0)
MCV: 84.3 fL (ref 78.0–100.0)
Monocytes Absolute: 0.9 10*3/uL (ref 0.1–1.0)
Monocytes Relative: 6 % (ref 3–12)
NEUTROS PCT: 72 % (ref 43–77)
Neutro Abs: 11 10*3/uL — ABNORMAL HIGH (ref 1.7–7.7)
Platelets: 358 10*3/uL (ref 150–400)
RBC: 4.59 MIL/uL (ref 3.87–5.11)
RDW: 15 % (ref 11.5–15.5)
WBC: 15.3 10*3/uL — ABNORMAL HIGH (ref 4.0–10.5)

## 2014-11-27 LAB — URINALYSIS, ROUTINE W REFLEX MICROSCOPIC
BILIRUBIN URINE: NEGATIVE
Glucose, UA: NEGATIVE mg/dL
KETONES UR: NEGATIVE mg/dL
Nitrite: NEGATIVE
Protein, ur: 30 mg/dL — AB
Specific Gravity, Urine: 1.019 (ref 1.005–1.030)
UROBILINOGEN UA: 1 mg/dL (ref 0.0–1.0)
pH: 6.5 (ref 5.0–8.0)

## 2014-11-27 LAB — URINE MICROSCOPIC-ADD ON

## 2014-11-27 LAB — LIPASE, BLOOD: LIPASE: 22 U/L (ref 22–51)

## 2014-11-27 MED ORDER — CEPHALEXIN 500 MG PO CAPS: 500.0000 mg | ORAL_CAPSULE | Freq: Four times a day (QID) | ORAL | Status: DC

## 2014-11-27 MED ORDER — CEPHALEXIN 500 MG PO CAPS
500.0000 mg | ORAL_CAPSULE | Freq: Once | ORAL | Status: AC
  Administered 2014-11-27: 500 mg via ORAL
  Filled 2014-11-27: qty 1

## 2014-11-27 NOTE — Discharge Instructions (Signed)

## 2014-11-27 NOTE — ED Provider Notes (Signed)
CSN: [id number]     Arrival date & time 11/27/14  1600 History   First MD Initiated Contact with Patient 11/27/14 1914     Chief Complaint  Patient presents with  . Abdominal Pain   Patient is a 54 y.o. female presenting with abdominal pain. The history is provided by the patient.  Abdominal Pain Pain location:  RLQ Pain quality: sharp   Pain radiates to:  R flank Pain severity:  Moderate Onset quality:  Gradual Duration:  3 days Timing:  Constant Progression:  Worsening Relieved by:  Nothing Associated symptoms: diarrhea and dysuria (the other day but it now resolved)   Associated symptoms: no constipation, no fever and no vomiting   Normal appetite.  Sx have resolved at this point.  Past Medical History  Diagnosis Date  . Asthma   . Hypertension   . GERD (gastroesophageal reflux disease)   . Depression   . Diabetes mellitus   . Hyperlipidemia    Past Surgical History  Procedure Laterality Date  . Cesarian    . Cholecystectomy    . Abdominal surgery    . Abdominal hysterectomy    . Colonoscopy  02/02/2012    Procedure: COLONOSCOPY;  Surgeon: Vertell Novak., MD;  Location: [facility];  Service: Endoscopy;  Laterality: N/A;  . Stomach surgery    . Eye surgery     History reviewed. No pertinent family history. History  Substance Use Topics  . Smoking status: Never Smoker   . Smokeless tobacco: Never Used  . Alcohol Use: No   OB History    No data available     Review of Systems  Constitutional: Negative for fever.  Gastrointestinal: Positive for abdominal pain and diarrhea. Negative for vomiting and constipation.  Genitourinary: Positive for dysuria (the other day but it now resolved).  All other systems reviewed and are negative.     Allergies  Review of patient's allergies indicates no known allergies.  Home Medications   Prior to Admission medications   Medication Sig Start Date End Date Taking? Authorizing Provider  aspirin 81 MG tablet Take  81 mg by mouth daily.   Yes Historical Provider, MD  esomeprazole (NEXIUM) 40 MG capsule Take 40 mg by mouth daily at 12 noon.   Yes Historical Provider, MD  oxybutynin (DITROPAN-XL) 10 MG 24 hr tablet Take 10 mg by mouth daily.   Yes Historical Provider, MD  quinapril-hydrochlorothiazide (ACCURETIC) 20-12.5 MG per tablet Take 1 tablet by mouth daily.   Yes Historical Provider, MD  rosuvastatin (CRESTOR) 10 MG tablet Take 10 mg by mouth daily.   Yes Historical Provider, MD   BP 143/83 mmHg  Pulse 86  Temp(Src) 98.2 F (36.8 C) (Oral)  Resp 18  SpO2 99% Physical Exam  Constitutional: She appears well-developed and well-nourished. No distress.  HENT:  Head: Normocephalic and atraumatic.  Right Ear: External ear normal.  Left Ear: External ear normal.  Eyes: Conjunctivae are normal. Right eye exhibits no discharge. Left eye exhibits no discharge. No scleral icterus.  Neck: Neck supple. No tracheal deviation present.  Cardiovascular: Normal rate, regular rhythm and intact distal pulses.   Pulmonary/Chest: Effort normal and breath sounds normal. No stridor. No respiratory distress. She has no wheezes. She has no rales.  Abdominal: Soft. Bowel sounds are normal. She exhibits no distension. There is no tenderness. There is no rebound and no guarding.  Musculoskeletal: She exhibits no edema or tenderness.  Neurological: She is alert. She has normal strength.  No cranial nerve deficit (no facial droop, extraocular movements intact, no slurred speech) or sensory deficit. She exhibits normal muscle tone. She displays no seizure activity. Coordination normal.  Skin: Skin is warm and dry. No rash noted.  Psychiatric: She has a normal mood and affect.  Nursing note and vitals reviewed.   ED Course  Procedures (including critical care time) Labs Review Labs Reviewed  CBC WITH DIFFERENTIAL/PLATELET - Abnormal; Notable for the following:    WBC 15.3 (*)    Neutro Abs 11.0 (*)    All other  components within normal limits  COMPREHENSIVE METABOLIC PANEL - Abnormal; Notable for the following:    Potassium 3.4 (*)    Glucose, Bld 112 (*)    AST 12 (*)    ALT 13 (*)    All other components within normal limits  LIPASE, BLOOD  URINALYSIS, ROUTINE W REFLEX MICROSCOPIC (NOT AT [facility])   Medications  cephALEXin (KEFLEX) capsule 500 mg (not administered)     MDM   Final diagnoses:  UTI (lower urinary tract infection)    Pt has not abdominal ttp on my exam.  Doubt obstruction , appendicitis. UA is consistent with a uti.  Will dc home on oral abx.  Follow up with PCP    Linwood Dibbles, MD 11/27/14 2000

## 2014-11-27 NOTE — ED Notes (Signed)
Pt c/o low medial abdominal pain and diarrhea x several days, radiation to right flank onset yesterday. Pt denies any urinary symptoms.

## 2014-11-29 LAB — URINE CULTURE: Colony Count: >=100000

## 2014-11-30 ENCOUNTER — Telehealth (HOSPITAL_COMMUNITY): Payer: Self-pay

## 2014-11-30 NOTE — Telephone Encounter (Signed)
Post ED Visit - Positive Culture Follow-up  Culture report reviewed by antimicrobial stewardship pharmacist: []  Wes Dulaney, Pharm.D., BCPS [x]  Celedonio Miyamoto, Pharm.D., BCPS []  Georgina Pillion, Pharm.D., BCPS []  [place], [place].D., BCPS, AAHIVP []  Estella Husk, Pharm.D., BCPS, AAHIVP []  Elder Cyphers, [address].D., BCPS  Positive urine culture Treated with cephalexin, organism sensitive to the same and no further patient follow-up is required at this time.  Ashley Jacobs 11/30/2014, 8:59 AM

## 2015-01-13 ENCOUNTER — Encounter: Payer: Self-pay | Admitting: *Deleted

## 2015-01-16 ENCOUNTER — Encounter: Payer: Medicaid Other | Admitting: Medical

## 2015-02-03 ENCOUNTER — Encounter: Payer: Medicaid Other | Admitting: Dietician

## 2015-02-17 ENCOUNTER — Encounter: Payer: Medicaid Other | Attending: Family

## 2015-02-17 ENCOUNTER — Other Ambulatory Visit: Payer: Self-pay

## 2015-02-17 VITALS — Ht 66.0 in | Wt 179.5 lb

## 2015-02-17 DIAGNOSIS — Z713 Dietary counseling and surveillance: Secondary | ICD-10-CM | POA: Diagnosis not present

## 2015-02-17 DIAGNOSIS — Z1231 Encounter for screening mammogram for malignant neoplasm of breast: Secondary | ICD-10-CM

## 2015-02-17 DIAGNOSIS — E119 Type 2 diabetes mellitus without complications: Secondary | ICD-10-CM | POA: Diagnosis present

## 2015-02-17 NOTE — Progress Notes (Signed)
Diabetes Self-Management Education  Visit Type: First/Initial  Appt. Start Time: 0900 Appt. End Time: 1200  02/17/2015  Jean Melton, identified by name and date of birth, is a 54 y.o. female with a diagnosis of Diabetes: Type 2. Patient present for group presentation of instence Diabetes Self Management Education.  ASSESSMENT  Height 5\' 6"  (1.676 m), weight 179 lb 8 oz (81.421 kg). Body mass index is 28.99 kg/(m^2).      Diabetes Self-Management Education - 02/17/15 1211    Visit Information   Visit Type First/Initial   Initial Visit   Diabetes Type Type 2   Are you currently following a meal plan? No   Are you taking your medications as prescribed? Yes   Health Coping   How would you rate your overall health? Good   Psychosocial Assessment   Patient Belief/Attitude about Diabetes Motivated to manage diabetes   Self-care barriers None   Self-management support Doctor's office;Support group;Family;CDE visits   Other persons present Patient   Patient Concerns Nutrition/Meal planning;Glycemic Control;Weight Control   Preferred Learning Style No preference indicated   Learning Readiness Change in progress   How often do you need to have someone help you when you read instructions, pamphlets, or other written materials from your doctor or pharmacy? 1 - Never   Complications   How often do you check your blood sugar? 1-2 times/day   Fasting Blood glucose range (mg/dL) 74-081   Postprandial Blood glucose range (mg/dL) 448-185   Number of hypoglycemic episodes per month 0   Number of hyperglycemic episodes per week 0   Have you had a dilated eye exam in the past 12 months? Yes   Have you had a dental exam in the past 12 months? No   Are you checking your feet? Yes   Exercise   Exercise Type ADL's   Patient Education   Previous Diabetes Education No   Individualized Goals (developed by patient)   Nutrition General guidelines for healthy choices and portions discussed   Medications take my medication as prescribed   Monitoring  test my blood glucose as discussed   Problem Solving Utilize American Diabetes Association Web Site for Valid Information   Reducing Risk increase portions of nuts and seeds;increase portions of healthy fats   Health Coping ask for help with (comment);Other (comment)  wood ideas   Outcomes   Expected Outcomes Demonstrated interest in learning. Expect positive outcomes   Future DMSE PRN   Program Status Completed      Individualized Plan for Diabetes Self-Management Training:   Learning Objective:  Patient will have a greater understanding of diabetes self-management. Patient education plan is to attend individual and/or group sessions per assessed needs and concerns.   Expected Outcomes:  Demonstrated interest in learning. Expect positive outcomes  Education material provided: Living Well with Diabetes, Food label handouts, A1C conversion sheet, Meal plan card, My Plate, Snack sheet, Support group flyer and No sodium seasonings  If problems or questions, patient to contact team via:  Phone  Future DSME appointment: PRN

## 2015-02-24 ENCOUNTER — Ambulatory Visit: Payer: Medicaid Other

## 2015-02-27 ENCOUNTER — Encounter: Payer: Medicaid Other | Admitting: Medical

## 2015-03-03 ENCOUNTER — Ambulatory Visit: Payer: Medicaid Other

## 2015-03-20 ENCOUNTER — Ambulatory Visit (INDEPENDENT_AMBULATORY_CARE_PROVIDER_SITE_OTHER): Payer: Medicaid Other | Admitting: Obstetrics & Gynecology

## 2015-03-20 ENCOUNTER — Encounter: Payer: Self-pay | Admitting: Obstetrics & Gynecology

## 2015-03-20 VITALS — BP 141/87 | HR 86 | Ht 66.0 in | Wt 180.8 lb

## 2015-03-20 DIAGNOSIS — Z90710 Acquired absence of both cervix and uterus: Secondary | ICD-10-CM | POA: Diagnosis not present

## 2015-03-20 DIAGNOSIS — Z01419 Encounter for gynecological examination (general) (routine) without abnormal findings: Secondary | ICD-10-CM

## 2015-03-20 DIAGNOSIS — N951 Menopausal and female climacteric states: Secondary | ICD-10-CM | POA: Diagnosis not present

## 2015-03-20 DIAGNOSIS — Z0000 Encounter for general adult medical examination without abnormal findings: Secondary | ICD-10-CM

## 2015-03-20 NOTE — Progress Notes (Signed)
Patient ID: Jean Melton, female   DOB: 1961/06/30, 54 y.o.   MRN: [id number] Subjective:     Jean Melton is a 54 y.o. female here for a routine exam.  Current complaints: none.  Sexaully active.  No concerning menopausal sx.   Gynecologic History No LMP recorded. Patient has had a hysterectomy. Contraception: status post hysterectomy Last Pap: 2000. Results were: normal Last mammogram: 2015. Results were: normal (up to date- due October 2016)  Obstetric History OB History  Gravida Para Term Preterm AB SAB TAB Ectopic Multiple Living  4 4 4      1 5     # Outcome Date GA Lbr Len/2nd Weight Sex Delivery Anes PTL Lv  4 Term           3 Term           2 Term           1A Term           1B Term              The following portions of the patient's history were reviewed and updated as appropriate: allergies, current medications, past family history, past medical history, past social history, past surgical history and problem list.  Review of Systems Pertinent items are noted in HPI.    Objective:   BP 141/87 mmHg  Pulse 86  Ht 5\' 6"  (1.676 m)  Wt 180 lb 12.8 oz (82.01 kg)  BMI 29.20 kg/m2 General Appearance:    Alert, cooperative, no distress, appears stated age  Head:    Normocephalic, without obvious abnormality, atraumatic  Eyes:    conjunctiva/corneas clear, EOM's intact, both eyes  Ears:    Normal external ear canals, both ears  Nose:   Nares normal, septum midline, mucosa normal, no drainage    or sinus tenderness  Throat:   Lips, mucosa, and tongue normal; teeth and gums normal  Neck:   Supple, symmetrical, trachea midline, no adenopathy;    thyroid:  no enlargement/tenderness/nodules  Back:     Symmetric, no curvature, ROM normal, no CVA tenderness  Lungs:     Clear to auscultation bilaterally, respirations unlabored  Chest Wall:    No tenderness or deformity   Heart:    Regular rate and rhythm, S1 and S2 normal, no murmur, rub   or gallop  Breast Exam:    No  tenderness, masses, or nipple abnormality  Abdomen:     Soft, non-tender, bowel sounds active all four quadrants,    no masses, no organomegaly  Genitalia:    Normal female without lesion, discharge or tenderness     Extremities:   Extremities normal, atraumatic, no cyanosis or edema  Pulses:   2+ and symmetric all extremities  Skin:   Skin color, texture, turgor normal, no rashes or lesions    Assessment:    Healthy female exam.   Post hyst Menopausal state   Plan:    Follow up in: 1 year.    Pt will make mammogram appt for Oct. 2016  Carolyn L. Harraway-Smith, M.D., [place]

## 2015-03-20 NOTE — Patient Instructions (Signed)
Mammography Mammography is an X-ray of the breasts to look for changes that are not normal. The X-ray image is called a mammogram. This procedure can screen for breast cancer, can detect cancer early, and can diagnose cancer.  LET YOUR CAREGIVER KNOW ABOUT:  Breast implants.  Previous breast disease, biopsy, or surgery.  If you are breastfeeding.  Medicines taken, including vitamins, herbs, eyedrops, over-the-counter medicines, and creams.  Use of steroids (by mouth or creams).  Possibility of pregnancy, if this applies. RISKS AND COMPLICATIONS  Exposure to radiation, but at very low levels.  The results may be misinterpreted.  The results may not be accurate.  Mammography may lead to further tests.  Mammography may not catch certain cancers. BEFORE THE PROCEDURE  Schedule your test about 7 days after your menstrual period. This is when your breasts are the least tender and have signs of hormone changes.  If you have had a mammography done at a different facility in the past, get the mammogram X-rays or have them sent to your current exam facility in order to compare them.  Wash your breasts and under your arms the day of the test.  Do not wear deodorants, perfumes, or powders anywhere on your body.  Wear clothes that you can change in and out of easily. PROCEDURE Relax as much as possible during the test. Any discomfort during the test will be very brief. The test should take less than 30 minutes. The following will happen:  You will undress from the waist up and put on a gown.  You will stand in front of the X-ray machine.  Each breast will be placed between 2 plastic or glass plates. The plates will compress your breast for a few seconds.  X-rays will be taken from different angles of the breast. AFTER THE PROCEDURE  The mammogram will be examined.  Depending on the quality of the images, you may need to repeat certain parts of the test.  Ask when your test  results will be ready. Make sure you get your test results.  You may resume normal activities. Document Released: 06/17/2000 Document Revised: 09/12/2011 Document Reviewed: 04/10/2011 Exit[employer] Patient Information 2015 [employer]. This information is not intended to replace advice given to you by your health care provider. Make sure you discuss any questions you have with your health care provider.  

## 2015-03-23 ENCOUNTER — Ambulatory Visit: Payer: Medicaid Other

## 2015-03-24 ENCOUNTER — Encounter: Payer: Self-pay | Admitting: Obstetrics & Gynecology

## 2015-04-17 ENCOUNTER — Ambulatory Visit
Admission: RE | Admit: 2015-04-17 | Discharge: 2015-04-17 | Disposition: A | Discharge status: Home / Self Care | Payer: Medicaid Other | Source: Ambulatory Visit

## 2015-04-17 DIAGNOSIS — Z1231 Encounter for screening mammogram for malignant neoplasm of breast: Secondary | ICD-10-CM

## 2015-05-22 ENCOUNTER — Emergency Department (HOSPITAL_COMMUNITY): Payer: Medicaid Other

## 2015-05-22 ENCOUNTER — Emergency Department (HOSPITAL_COMMUNITY)
Admission: EM | Admit: 2015-05-22 | Discharge: 2015-05-22 | Disposition: A | Discharge status: Home / Self Care | Payer: Medicaid Other | Attending: Emergency Medicine | Admitting: Emergency Medicine

## 2015-05-22 ENCOUNTER — Encounter (HOSPITAL_COMMUNITY): Payer: Self-pay

## 2015-05-22 DIAGNOSIS — Z90710 Acquired absence of both cervix and uterus: Secondary | ICD-10-CM | POA: Diagnosis not present

## 2015-05-22 DIAGNOSIS — Z79899 Other long term (current) drug therapy: Secondary | ICD-10-CM | POA: Diagnosis not present

## 2015-05-22 DIAGNOSIS — Z3202 Encounter for pregnancy test, result negative: Secondary | ICD-10-CM | POA: Insufficient documentation

## 2015-05-22 DIAGNOSIS — F329 Major depressive disorder, single episode, unspecified: Secondary | ICD-10-CM | POA: Insufficient documentation

## 2015-05-22 DIAGNOSIS — K529 Noninfective gastroenteritis and colitis, unspecified: Secondary | ICD-10-CM | POA: Insufficient documentation

## 2015-05-22 DIAGNOSIS — J45909 Unspecified asthma, uncomplicated: Secondary | ICD-10-CM | POA: Insufficient documentation

## 2015-05-22 DIAGNOSIS — K219 Gastro-esophageal reflux disease without esophagitis: Secondary | ICD-10-CM | POA: Diagnosis not present

## 2015-05-22 DIAGNOSIS — Z7982 Long term (current) use of aspirin: Secondary | ICD-10-CM | POA: Diagnosis not present

## 2015-05-22 DIAGNOSIS — Z7951 Long term (current) use of inhaled steroids: Secondary | ICD-10-CM | POA: Insufficient documentation

## 2015-05-22 DIAGNOSIS — E785 Hyperlipidemia, unspecified: Secondary | ICD-10-CM | POA: Insufficient documentation

## 2015-05-22 DIAGNOSIS — R197 Diarrhea, unspecified: Secondary | ICD-10-CM

## 2015-05-22 DIAGNOSIS — Z9049 Acquired absence of other specified parts of digestive tract: Secondary | ICD-10-CM | POA: Insufficient documentation

## 2015-05-22 DIAGNOSIS — Z7952 Long term (current) use of systemic steroids: Secondary | ICD-10-CM | POA: Insufficient documentation

## 2015-05-22 DIAGNOSIS — E119 Type 2 diabetes mellitus without complications: Secondary | ICD-10-CM | POA: Insufficient documentation

## 2015-05-22 DIAGNOSIS — R112 Nausea with vomiting, unspecified: Secondary | ICD-10-CM | POA: Diagnosis present

## 2015-05-22 DIAGNOSIS — I10 Essential (primary) hypertension: Secondary | ICD-10-CM | POA: Insufficient documentation

## 2015-05-22 LAB — COMPREHENSIVE METABOLIC PANEL
ALBUMIN: 3.6 g/dL (ref 3.5–5.0)
ALT: 32 U/L (ref 14–54)
AST: 25 U/L (ref 15–41)
Alkaline Phosphatase: 51 U/L (ref 38–126)
Anion gap: 12 (ref 5–15)
BUN: 21 mg/dL — AB (ref 6–20)
CHLORIDE: 99 mmol/L — AB (ref 101–111)
CO2: 24 mmol/L (ref 22–32)
Calcium: 9.4 mg/dL (ref 8.9–10.3)
Creatinine, Ser: 1.41 mg/dL — ABNORMAL HIGH (ref 0.44–1.00)
GFR calc Af Amer: 48 mL/min — ABNORMAL LOW (ref >60)
GFR calc non Af Amer: 42 mL/min — ABNORMAL LOW (ref >60)
GLUCOSE: 165 mg/dL — AB (ref 65–99)
POTASSIUM: 3.3 mmol/L — AB (ref 3.5–5.1)
SODIUM: 135 mmol/L (ref 135–145)
Total Bilirubin: 0.5 mg/dL (ref 0.3–1.2)
Total Protein: 7.9 g/dL (ref 6.5–8.1)

## 2015-05-22 LAB — URINALYSIS, ROUTINE W REFLEX MICROSCOPIC
Bilirubin Urine: NEGATIVE
GLUCOSE, UA: NEGATIVE mg/dL
HGB URINE DIPSTICK: NEGATIVE
KETONES UR: NEGATIVE mg/dL
LEUKOCYTES UA: NEGATIVE
Nitrite: NEGATIVE
PH: 5.5 (ref 5.0–8.0)
Protein, ur: NEGATIVE mg/dL
Specific Gravity, Urine: 1.007 (ref 1.005–1.030)

## 2015-05-22 LAB — CBC
HEMATOCRIT: 39.3 % (ref 36.0–46.0)
Hemoglobin: 12.6 g/dL (ref 12.0–15.0)
MCH: 26.8 pg (ref 26.0–34.0)
MCHC: 32.1 g/dL (ref 30.0–36.0)
MCV: 83.4 fL (ref 78.0–100.0)
Platelets: 410 10*3/uL — ABNORMAL HIGH (ref 150–400)
RBC: 4.71 MIL/uL (ref 3.87–5.11)
RDW: 14.8 % (ref 11.5–15.5)
WBC: 10.8 10*3/uL — AB (ref 4.0–10.5)

## 2015-05-22 LAB — LIPASE, BLOOD: LIPASE: 36 U/L (ref 11–51)

## 2015-05-22 LAB — POC URINE PREG, ED: Preg Test, Ur: NEGATIVE

## 2015-05-22 MED ORDER — ONDANSETRON HCL 4 MG PO TABS: 4.0000 mg | ORAL_TABLET | Freq: Four times a day (QID) | ORAL | Status: DC

## 2015-05-22 MED ORDER — SODIUM CHLORIDE 0.9 % IV BOLUS (SEPSIS): 1000.0000 mL | Freq: Once | INTRAVENOUS | Status: DC

## 2015-05-22 MED ORDER — SODIUM CHLORIDE 0.9 % IV SOLN
Freq: Once | INTRAVENOUS | Status: AC
  Administered 2015-05-22: 05:00:00 via INTRAVENOUS

## 2015-05-22 MED ORDER — FAMOTIDINE IN NACL 20-0.9 MG/50ML-% IV SOLN
20.0000 mg | Freq: Once | INTRAVENOUS | Status: AC
  Administered 2015-05-22: 20 mg via INTRAVENOUS
  Filled 2015-05-22: qty 50

## 2015-05-22 MED ORDER — SODIUM CHLORIDE 0.9 % IV SOLN
Freq: Once | INTRAVENOUS | Status: AC
  Administered 2015-05-22: 06:00:00 via INTRAVENOUS

## 2015-05-22 MED ORDER — IOHEXOL 300 MG/ML  SOLN
50.0000 mL | Freq: Once | INTRAMUSCULAR | Status: AC | PRN
  Administered 2015-05-22: 50 mL via ORAL

## 2015-05-22 MED ORDER — TRAMADOL HCL 50 MG PO TABS: 50.0000 mg | ORAL_TABLET | Freq: Four times a day (QID) | ORAL | Status: DC | PRN

## 2015-05-22 MED ORDER — SODIUM CHLORIDE 0.9 % IV BOLUS (SEPSIS)
1000.0000 mL | Freq: Once | INTRAVENOUS | Status: AC
  Administered 2015-05-22: 1000 mL via INTRAVENOUS

## 2015-05-22 MED ORDER — ONDANSETRON HCL 4 MG/2ML IJ SOLN
4.0000 mg | Freq: Once | INTRAMUSCULAR | Status: AC | PRN
  Administered 2015-05-22: 4 mg via INTRAVENOUS
  Filled 2015-05-22: qty 2

## 2015-05-22 MED ORDER — MORPHINE SULFATE (PF) 4 MG/ML IV SOLN
4.0000 mg | Freq: Once | INTRAVENOUS | Status: AC
  Administered 2015-05-22: 4 mg via INTRAVENOUS
  Filled 2015-05-22: qty 1

## 2015-05-22 MED ORDER — ONDANSETRON HCL 4 MG/2ML IJ SOLN
4.0000 mg | Freq: Once | INTRAMUSCULAR | Status: AC
Start: 2015-05-22 — End: 2015-05-22
  Administered 2015-05-22: 4 mg via INTRAVENOUS
  Filled 2015-05-22: qty 2

## 2015-05-22 NOTE — Discharge Instructions (Signed)
Colitis  Stay well hydrated and eat a bland diet for the next 48 hours.   Colitis is inflammation of the colon. Colitis may last a short time (acute) or it may last a long time (chronic). CAUSES This condition may be caused by:  Viruses.  Bacteria.  Reactions to medicine.  Certain autoimmune diseases, such as Crohn disease or ulcerative colitis. SYMPTOMS Symptoms of this condition include:  Diarrhea.  Passing bloody or tarry stool.  Pain.  Fever.  Vomiting.  Tiredness (fatigue).  Weight loss.  Bloating.  Sudden increase in abdominal pain.  Having fewer bowel movements than usual. DIAGNOSIS This condition is diagnosed with a stool test or a blood test. You may also have other tests, including X-rays, a CT scan, or a colonoscopy. TREATMENT Treatment may include:  Resting the bowel. This involves not eating or drinking for a period of time.  Fluids that are given through an IV tube.  Medicine for pain and diarrhea.  Antibiotic medicines.  Cortisone medicines.  Surgery. HOME CARE INSTRUCTIONS Eating and Drinking  Follow instructions from your health care provider about eating or drinking restrictions.  Drink enough fluid to keep your urine clear or pale yellow.  Work with a dietitian to determine which foods cause your condition to flare up.  Avoid foods that cause flare-ups.  Eat a well-balanced diet. Medicines  Take over-the-counter and prescription medicines only as told by your health care provider.  If you were prescribed an antibiotic medicine, take it as told by your health care provider. Do not stop taking the antibiotic even if you start to feel better. General Instructions  Keep all follow-up visits as told by your health care provider. This is important. SEEK MEDICAL CARE IF:  Your symptoms do not go away.  You develop new symptoms. SEEK IMMEDIATE MEDICAL CARE IF:  You have a fever that does not go away with treatment.  You  develop chills.  You have extreme weakness, fainting, or dehydration.  You have repeated vomiting.  You develop severe pain in your abdomen.  You pass bloody or tarry stool.   This information is not intended to replace advice given to you by your health care provider. Make sure you discuss any questions you have with your health care provider.   Document Released: 07/28/2004 Document Revised: 03/11/2015 Document Reviewed: 10/13/2014 Elsevier Interactive Patient Education [employer]

## 2015-05-22 NOTE — ED Provider Notes (Signed)
CSN: [id number]     Arrival date & time 05/22/15  0441 History   First MD Initiated Contact with Patient 05/22/15 [phone]     Chief Complaint  Patient presents with  . Emesis     (Consider location/radiation/quality/duration/timing/severity/associated sxs/prior Treatment) Patient is a 54 y.o. female presenting with vomiting. The history is provided by the patient and a friend. No language interpreter was used.  Emesis Associated symptoms: abdominal pain and diarrhea   Associated symptoms: no chills   Ms. Meleski is a 54 y.o female with a history of asthma, hypertension, GERD, depression, diabetes, and hyperlipidemia who presents complaining of abdominal pain and several episodes of vomiting and diarrhea times one week. She is now having hematemesis and feels dehydrated. She states she also had dark colored stool at first and then right red blood.  She thought it was the flu at first and then thought she may have a stomach bug.    She denies any fever, cough, chest pain, shortness of breath, dysuria, hematuria, urinary frequency, vaginal bleeding or discharge.  Past Medical History  Diagnosis Date  . Asthma   . Hypertension   . GERD (gastroesophageal reflux disease)   . Depression   . Diabetes mellitus (HCC)   . Hyperlipidemia    Past Surgical History  Procedure Laterality Date  . Cesarian    . Cholecystectomy    . Abdominal surgery    . Abdominal hysterectomy    . Colonoscopy  02/02/2012    Procedure: COLONOSCOPY;  Surgeon: Vertell Novak., MD;  Location: [facility];  Service: Endoscopy;  Laterality: N/A;  . Stomach surgery    . Eye surgery     Family History  Problem Relation Age of Onset  . Diabetes Father    Social History  Substance Use Topics  . Smoking status: Never Smoker   . Smokeless tobacco: Never Used  . Alcohol Use: Yes   OB History    Gravida Para Term Preterm AB TAB SAB Ectopic Multiple Living   Review of Systems  Constitutional:  Negative for fever and chills.  Respiratory: Negative for shortness of breath.   Gastrointestinal: Positive for nausea, vomiting, abdominal pain, diarrhea and blood in stool.  All other systems reviewed and are negative.     Allergies  Review of patient's allergies indicates no known allergies.  Home Medications   Prior to Admission medications   Medication Sig Start Date End Date Taking? Authorizing Provider  albuterol (PROVENTIL HFA) 108 (90 BASE) MCG/ACT inhaler Inhale 2 puffs into the lungs every 4 (four) hours as needed. Cough/wheezing 05/11/15  Yes Historical Provider, MD  aspirin 81 MG tablet Take 81 mg by mouth daily.   Yes Historical Provider, MD  atorvastatin (LIPITOR) 20 MG tablet Take 20 mg by mouth daily.   Yes Historical Provider, MD  cetirizine (ZYRTEC) 10 MG tablet Take 10 mg by mouth daily. 05/11/15  Yes Historical Provider, MD  clobetasol cream (TEMOVATE) 0.05 % Apply 1 application topically 2 (two) times daily. 05/11/15  Yes Historical Provider, MD  clotrimazole (LOTRIMIN) 1 % cream Apply 1 application topically 2 (two) times daily. 05/11/15  Yes Historical Provider, MD  cycloSPORINE (RESTASIS) 0.05 % ophthalmic emulsion Place 1 drop into both eyes 2 (two) times daily. 05/12/15  Yes Historical Provider, MD  diclofenac sodium (VOLTAREN) 1 % GEL Apply 2-4 g topically See admin instructions. 2 gm four times daily to affected  UE joints and 4 gm four times daily to LE joints 05/12/15  Yes Historical Provider, MD  Fluticasone-Salmeterol (ADVAIR DISKUS) 250-50 MCG/DOSE AEPB Inhale 1 puff into the lungs 2 (two) times daily. 05/11/15  Yes Historical Provider, MD  omeprazole (PRILOSEC) 20 MG capsule Take 20 mg by mouth daily. 05/11/15  Yes Historical Provider, MD  oxybutynin (DITROPAN-XL) 10 MG 24 hr tablet Take 10 mg by mouth daily.   Yes Historical Provider, MD  quinapril-hydrochlorothiazide (ACCURETIC) 20-12.5 MG per tablet Take 1 tablet by mouth daily.   Yes Historical Provider, MD    rosuvastatin (CRESTOR) 10 MG tablet Take 10 mg by mouth daily.   Yes Historical Provider, MD  SitaGLIPtin-MetFORMIN HCl 50-1000 MG TB24 Take 2 tablets by mouth daily.   Yes Historical Provider, MD  cephALEXin (KEFLEX) 500 MG capsule Take 1 capsule (500 mg total) by mouth 4 (four) times daily. Patient not taking: Reported on 03/20/2015 11/27/14   Linwood Dibbles, MD  ondansetron (ZOFRAN) 4 MG tablet Take 1 tablet (4 mg total) by mouth every 6 (six) hours. 05/22/15   Andretta Ergle Patel-Mills, PA-C  traMADol (ULTRAM) 50 MG tablet Take 1 tablet (50 mg total) by mouth every 6 (six) hours as needed. 05/22/15   Ledford Goodson Patel-Mills, PA-C   BP 124/78 mmHg  Pulse 90  Temp(Src) 98.2 F (36.8 C) (Oral)  Resp 18  Ht 5\' 6"  (1.676 m)  Wt 81.647 kg  BMI 29.07 kg/m2  SpO2 97% Physical Exam  Constitutional: She is oriented to person, place, and time. She appears well-developed and well-nourished. No distress.  HENT:  Head: Normocephalic and atraumatic.  Eyes: Conjunctivae are normal.  Neck: Normal range of motion. Neck supple.  Cardiovascular: Normal rate, regular rhythm and normal heart sounds.   Pulmonary/Chest: Effort normal and breath sounds normal. No respiratory distress. She has no wheezes. She has no rales.  Abdominal: Soft. She exhibits no distension. There is no rebound and no guarding.    She has reproducible right-sided and epigastric abdominal pain. No abdominal distention. No guarding or rebound. No CVA tenderness.  Genitourinary:  Patient refused rectal exam. Importance of the exam was explained the patient refused again.  Musculoskeletal: Normal range of motion.  Neurological: She is alert and oriented to person, place, and time.  Skin: Skin is warm and dry.  Nursing note and vitals reviewed.   ED Course  Procedures (including critical care time) Labs Review Labs Reviewed  COMPREHENSIVE METABOLIC PANEL - Abnormal; Notable for the following:    Potassium 3.3 (*)    Chloride 99 (*)     Glucose, Bld 165 (*)    BUN 21 (*)    Creatinine, Ser 1.41 (*)    GFR calc non Af Amer 42 (*)    GFR calc Af Amer 48 (*)    All other components within normal limits  CBC - Abnormal; Notable for the following:    WBC 10.8 (*)    Platelets 410 (*)    All other components within normal limits  LIPASE, BLOOD  URINALYSIS, ROUTINE W REFLEX MICROSCOPIC (NOT AT [facility])  POC URINE PREG, ED    Imaging Review No results found. I have personally reviewed and evaluated these images and lab results as part of my medical decision-making.   EKG Interpretation None      MDM   Final diagnoses:  Colitis  Nausea vomiting and diarrhea   Patient presents for hematemesis, diarrhea, and abdominal pain. She refused rectal exam. She is well-appearing and in no  acute distress. Her vital signs are stable. UA is normal. Her labs show mild hypokalemia but are otherwise not concerning and comparable to previous. This is most likely due to vomiting and diarrhea. Her CT abdomen shows that she has had a cholecystectomy and hysterectomy. She has mild wall thickening in the transverse colon may be infectious versus inflammatory colitis. Medications  ondansetron (ZOFRAN) injection 4 mg (4 mg Intravenous Given 05/22/15 0508)  0.9 %  sodium chloride infusion ( Intravenous Stopped 05/22/15 0607)  0.9 %  sodium chloride infusion ( Intravenous Stopped 05/22/15 0734)  ondansetron (ZOFRAN) injection 4 mg (4 mg Intravenous Given 05/22/15 0734)  famotidine (PEPCID) IVPB 20 mg premix (0 mg Intravenous Stopped 05/22/15 0814)  sodium chloride 0.9 % bolus 1,000 mL (0 mLs Intravenous Stopped 05/22/15 1008)  iohexol (OMNIPAQUE) 300 MG/ML solution 50 mL (50 mLs Oral Contrast Given 05/22/15 0713)  morphine 4 MG/ML injection 4 mg (4 mg Intravenous Given 05/22/15 0758)  Recheck: She is tolerating by mouth fluids. Her hemoglobin is stable. I believe that she will be able to follow up with GI outpatient. Return precautions were  discussed as well as eating a bland diet for the next 48 hours.  She verbally agrees with the plan.  Rx: ultram and zofran. Filed Vitals:   05/22/15 0816 05/22/15 1009  BP: 119/79 124/78  Pulse: 74 90  Temp:  98.2 F (36.8 C)  Resp: [address], PA-C 05/26/15 0949  Donnetta Hutching, MD 05/26/15 1218

## 2015-05-22 NOTE — ED Notes (Signed)
Pt complains of vomiting and diarrhea for one week, she states now she's vomiting blood and she's very dehydrated

## 2015-05-22 NOTE — ED Notes (Signed)
PT to CT.

## 2015-05-22 NOTE — ED Notes (Signed)
Delay on urine sample pt states when she goes to the restroom no urine

## 2015-05-22 NOTE — ED Notes (Signed)
MD at bedside. 

## 2015-05-22 NOTE — ED Notes (Signed)
Pt returned from CT °

## 2015-07-29 DIAGNOSIS — M25571 Pain in right ankle and joints of right foot: Secondary | ICD-10-CM

## 2015-07-29 DIAGNOSIS — M25572 Pain in left ankle and joints of left foot: Secondary | ICD-10-CM

## 2015-07-29 DIAGNOSIS — G8929 Other chronic pain: Secondary | ICD-10-CM | POA: Insufficient documentation

## 2015-10-21 ENCOUNTER — Inpatient Hospital Stay (HOSPITAL_COMMUNITY)
Admission: AD | Admit: 2015-10-21 | Discharge: 2015-10-21 | Disposition: A | Discharge status: Home / Self Care | Payer: Medicaid Other | Source: Ambulatory Visit | Attending: Obstetrics & Gynecology | Admitting: Obstetrics & Gynecology

## 2015-10-21 ENCOUNTER — Encounter (HOSPITAL_COMMUNITY): Payer: Self-pay

## 2015-10-21 DIAGNOSIS — J45909 Unspecified asthma, uncomplicated: Secondary | ICD-10-CM | POA: Diagnosis not present

## 2015-10-21 DIAGNOSIS — N3001 Acute cystitis with hematuria: Secondary | ICD-10-CM | POA: Diagnosis not present

## 2015-10-21 DIAGNOSIS — E785 Hyperlipidemia, unspecified: Secondary | ICD-10-CM | POA: Diagnosis not present

## 2015-10-21 DIAGNOSIS — Z7982 Long term (current) use of aspirin: Secondary | ICD-10-CM | POA: Diagnosis not present

## 2015-10-21 DIAGNOSIS — I10 Essential (primary) hypertension: Secondary | ICD-10-CM | POA: Diagnosis not present

## 2015-10-21 DIAGNOSIS — E119 Type 2 diabetes mellitus without complications: Secondary | ICD-10-CM | POA: Diagnosis not present

## 2015-10-21 DIAGNOSIS — F329 Major depressive disorder, single episode, unspecified: Secondary | ICD-10-CM | POA: Insufficient documentation

## 2015-10-21 DIAGNOSIS — Z7984 Long term (current) use of oral hypoglycemic drugs: Secondary | ICD-10-CM | POA: Diagnosis not present

## 2015-10-21 DIAGNOSIS — K219 Gastro-esophageal reflux disease without esophagitis: Secondary | ICD-10-CM | POA: Diagnosis not present

## 2015-10-21 LAB — POCT PREGNANCY, URINE: Preg Test, Ur: NEGATIVE

## 2015-10-21 LAB — URINALYSIS, ROUTINE W REFLEX MICROSCOPIC
Bilirubin Urine: NEGATIVE
GLUCOSE, UA: 250 mg/dL — AB
Ketones, ur: NEGATIVE mg/dL
Nitrite: NEGATIVE
Protein, ur: NEGATIVE mg/dL
pH: 5.5 (ref 5.0–8.0)

## 2015-10-21 LAB — URINE MICROSCOPIC-ADD ON: BACTERIA UA: NONE SEEN

## 2015-10-21 MED ORDER — SULFAMETHOXAZOLE-TRIMETHOPRIM 800-160 MG PO TABS: 1.0000 | ORAL_TABLET | Freq: Two times a day (BID) | ORAL | Status: DC

## 2015-10-21 NOTE — MAU Provider Note (Signed)
History     CSN: [id number]  Arrival date and time: 10/21/15 1503   First Provider Initiated Contact with Patient 10/21/15 1534      Chief Complaint  Patient presents with  . Vaginal Pain   HPI  Jean Melton is a 55 year old female post hysterectomy  presenting for vaginal pain. She states that the pain began 3 weeks ago and is intermittent with some associated pressure. She noticed that it had originally revolved around urination, but now it is not necessarily related. Pain has been moderately-well controlled with aleve. She admits to increased frequency and urgency. Denies fever/chills, hematuria, vaginal discharge/bleeding or irritation, abdominal/flank or back pain. She is currently not experiencing any symptoms.  OB History    Gravida Para Term Preterm AB TAB SAB Ectopic Multiple Living   4 4 4      1 5       Past Medical History  Diagnosis Date  . Asthma   . Hypertension   . GERD (gastroesophageal reflux disease)   . Depression   . Diabetes mellitus (HCC)   . Hyperlipidemia     Past Surgical History  Procedure Laterality Date  . Cesarian    . Cholecystectomy    . Abdominal surgery    . Abdominal hysterectomy    . Colonoscopy  02/02/2012    Procedure: COLONOSCOPY;  Surgeon: Vertell Novak., MD;  Location: [facility];  Service: Endoscopy;  Laterality: N/A;  . Stomach surgery    . Eye surgery    . Cesarean section      Family History  Problem Relation Age of Onset  . Diabetes Father     Social History  Substance Use Topics  . Smoking status: Never Smoker   . Smokeless tobacco: Never Used  . Alcohol Use: Yes    Allergies: No Known Allergies  Prescriptions prior to admission  Medication Sig Dispense Refill Last Dose  . albuterol (PROVENTIL HFA) 108 (90 BASE) MCG/ACT inhaler Inhale 2 puffs into the lungs every 4 (four) hours as needed. Cough/wheezing   Past Week at Unknown time  . aspirin 81 MG tablet Take 81 mg by mouth daily.   Past Week at Unknown  time  . atorvastatin (LIPITOR) 20 MG tablet Take 20 mg by mouth daily.   Past Week at Unknown time  . cephALEXin (KEFLEX) 500 MG capsule Take 1 capsule (500 mg total) by mouth 4 (four) times daily. (Patient not taking: Reported on 03/20/2015) 20 capsule 0 Completed Course at Unknown time  . cetirizine (ZYRTEC) 10 MG tablet Take 10 mg by mouth daily.   Past Week at Unknown time  . clobetasol cream (TEMOVATE) 0.05 % Apply 1 application topically 2 (two) times daily.   Past Week at Unknown time  . clotrimazole (LOTRIMIN) 1 % cream Apply 1 application topically 2 (two) times daily.   Past Week at Unknown time  . cycloSPORINE (RESTASIS) 0.05 % ophthalmic emulsion Place 1 drop into both eyes 2 (two) times daily.   Past Week at Unknown time  . diclofenac sodium (VOLTAREN) 1 % GEL Apply 2-4 g topically See admin instructions. 2 gm four times daily to affected UE joints and 4 gm four times daily to LE joints   Past Week at Unknown time  . Fluticasone-Salmeterol (ADVAIR DISKUS) 250-50 MCG/DOSE AEPB Inhale 1 puff into the lungs 2 (two) times daily.   Past Week at Unknown time  . omeprazole (PRILOSEC) 20 MG capsule Take 20 mg by mouth daily.  Past Week at Unknown time  . ondansetron (ZOFRAN) 4 MG tablet Take 1 tablet (4 mg total) by mouth every 6 (six) hours. 12 tablet 0   . oxybutynin (DITROPAN-XL) 10 MG 24 hr tablet Take 10 mg by mouth daily.   Past Month at Unknown time  . quinapril-hydrochlorothiazide (ACCURETIC) 20-12.5 MG per tablet Take 1 tablet by mouth daily.   Past Week at Unknown time  . rosuvastatin (CRESTOR) 10 MG tablet Take 10 mg by mouth daily.   Past Month at Unknown time  . SitaGLIPtin-MetFORMIN HCl 50-1000 MG TB24 Take 2 tablets by mouth daily.   Past Week at Unknown time  . traMADol (ULTRAM) 50 MG tablet Take 1 tablet (50 mg total) by mouth every 6 (six) hours as needed. 10 tablet 0     Review of Systems  Constitutional: Negative for fever, chills and malaise/fatigue.  Gastrointestinal:  Negative for nausea, vomiting, abdominal pain, diarrhea and constipation.  Genitourinary: Positive for urgency and frequency. Negative for dysuria, hematuria and flank pain.  Musculoskeletal: Negative for back pain.   Physical Exam   Blood pressure 134/93, pulse 96, temperature 98.4 F (36.9 C), temperature source Oral, resp. rate 16.  Physical Exam  Constitutional: She is oriented to person, place, and time. She appears well-developed and well-nourished. No distress.  HENT:  Head: Normocephalic and atraumatic.  Eyes: Pupils are equal, round, and reactive to light.  Neck: Normal range of motion. Neck supple.  Cardiovascular: Normal rate and regular rhythm.   Heart rate a little elevated   Respiratory: Effort normal and breath sounds normal.  GI: Soft. Bowel sounds are normal. There is no tenderness (No CVA tenderness).  Neurological: She is alert and oriented to person, place, and time.   Results for orders placed or performed during the hospital encounter of 10/21/15 (from the past 24 hour(s))  Urinalysis, Routine w reflex microscopic (not at [facility])     Status: Abnormal   Collection Time: 10/21/15  3:09 PM  Result Value Ref Range   Color, Urine YELLOW YELLOW   APPearance CLEAR CLEAR   Specific Gravity, Urine <1.005 (L) 1.005 - 1.030   pH 5.5 5.0 - 8.0   Glucose, UA 250 (A) NEGATIVE mg/dL   Hgb urine dipstick SMALL (A) NEGATIVE   Bilirubin Urine NEGATIVE NEGATIVE   Ketones, ur NEGATIVE NEGATIVE mg/dL   Protein, ur NEGATIVE NEGATIVE mg/dL   Nitrite NEGATIVE NEGATIVE   Leukocytes, UA MODERATE (A) NEGATIVE  Urine microscopic-add on     Status: Abnormal   Collection Time: 10/21/15  3:09 PM  Result Value Ref Range   Squamous Epithelial / LPF 0-5 (A) NONE SEEN   WBC, UA 6-30 0 - 5 WBC/hpf   RBC / HPF 0-5 0 - 5 RBC/hpf   Bacteria, UA NONE SEEN NONE SEEN  Pregnancy, urine POC     Status: None   Collection Time: 10/21/15  3:17 PM  Result Value Ref Range   Preg Test, Ur NEGATIVE  NEGATIVE   MAU Course  Procedures  Jean Melton is a 55 year old female presenting with intermittent vaginal pain and pressure x 3 weeks. She is in no acute distress and is currently not experiencing pain. UA- moderate leukocytes on UA --> urine culture sent  Assessment and Plan  Vaginal pain- send urine culture and treat symptomatically   Eartha Inch PA-S 10/21/2015, 3:45 PM

## 2015-10-21 NOTE — Discharge Instructions (Signed)

## 2015-10-21 NOTE — MAU Note (Addendum)
Patient presents with pain in vaginal area for the past 3 weeks. Patient states she is not in pain right now.

## 2015-10-21 NOTE — MAU Provider Note (Signed)
History     CSN: [id number]  Arrival date and time: 10/21/15 1503   First Provider Initiated Contact with Patient 10/21/15 1534      Chief Complaint  Patient presents with  . Dysuria  . Urinary Frequency  . Urinary Urgency   Dysuria  This is a new problem. The current episode started 1 to 4 weeks ago. The problem occurs intermittently. The problem has been unchanged. The quality of the pain is described as burning. The patient is experiencing no pain. There has been no fever. She is not sexually active. There is no history of pyelonephritis. Associated symptoms include frequency and urgency. Pertinent negatives include no chills, discharge, flank pain, hematuria, hesitancy, nausea, possible pregnancy or vomiting. She has tried nothing for the symptoms. There is no history of kidney stones, recurrent UTIs or a single kidney.    OB History    Gravida Para Term Preterm AB TAB SAB Ectopic Multiple Living   4 4 4      1 5       Past Medical History  Diagnosis Date  . Asthma   . Hypertension   . GERD (gastroesophageal reflux disease)   . Depression   . Diabetes mellitus (HCC)   . Hyperlipidemia     Past Surgical History  Procedure Laterality Date  . Cesarian    . Cholecystectomy    . Abdominal surgery    . Abdominal hysterectomy    . Colonoscopy  02/02/2012    Procedure: COLONOSCOPY;  Surgeon: Vertell Novak., MD;  Location: [facility];  Service: Endoscopy;  Laterality: N/A;  . Stomach surgery    . Eye surgery    . Cesarean section      Family History  Problem Relation Age of Onset  . Diabetes Father     Social History  Substance Use Topics  . Smoking status: Never Smoker   . Smokeless tobacco: Never Used  . Alcohol Use: Yes    Allergies: No Known Allergies  Prescriptions prior to admission  Medication Sig Dispense Refill Last Dose  . albuterol (PROVENTIL HFA) 108 (90 BASE) MCG/ACT inhaler Inhale 2 puffs into the lungs every 4 (four) hours as needed.  Cough/wheezing   Past Week at Unknown time  . aspirin 81 MG tablet Take 81 mg by mouth daily.   Past Week at Unknown time  . atorvastatin (LIPITOR) 20 MG tablet Take 20 mg by mouth daily.   Past Week at Unknown time  . cephALEXin (KEFLEX) 500 MG capsule Take 1 capsule (500 mg total) by mouth 4 (four) times daily. (Patient not taking: Reported on 03/20/2015) 20 capsule 0 Completed Course at Unknown time  . cetirizine (ZYRTEC) 10 MG tablet Take 10 mg by mouth daily.   Past Week at Unknown time  . clobetasol cream (TEMOVATE) 0.05 % Apply 1 application topically 2 (two) times daily.   Past Week at Unknown time  . clotrimazole (LOTRIMIN) 1 % cream Apply 1 application topically 2 (two) times daily.   Past Week at Unknown time  . cycloSPORINE (RESTASIS) 0.05 % ophthalmic emulsion Place 1 drop into both eyes 2 (two) times daily.   Past Week at Unknown time  . diclofenac sodium (VOLTAREN) 1 % GEL Apply 2-4 g topically See admin instructions. 2 gm four times daily to affected UE joints and 4 gm four times daily to LE joints   Past Week at Unknown time  . Fluticasone-Salmeterol (ADVAIR DISKUS) 250-50 MCG/DOSE AEPB Inhale 1 puff into the lungs  2 (two) times daily.   Past Week at Unknown time  . omeprazole (PRILOSEC) 20 MG capsule Take 20 mg by mouth daily.   Past Week at Unknown time  . ondansetron (ZOFRAN) 4 MG tablet Take 1 tablet (4 mg total) by mouth every 6 (six) hours. 12 tablet 0   . oxybutynin (DITROPAN-XL) 10 MG 24 hr tablet Take 10 mg by mouth daily.   Past Month at Unknown time  . quinapril-hydrochlorothiazide (ACCURETIC) 20-12.5 MG per tablet Take 1 tablet by mouth daily.   Past Week at Unknown time  . rosuvastatin (CRESTOR) 10 MG tablet Take 10 mg by mouth daily.   Past Month at Unknown time  . SitaGLIPtin-MetFORMIN HCl 50-1000 MG TB24 Take 2 tablets by mouth daily.   Past Week at Unknown time  . traMADol (ULTRAM) 50 MG tablet Take 1 tablet (50 mg total) by mouth every 6 (six) hours as needed. 10  tablet 0     Review of Systems  Constitutional: Negative for fever and chills.  Gastrointestinal: Negative.  Negative for nausea and vomiting.  Genitourinary: Positive for dysuria, urgency and frequency. Negative for hesitancy, hematuria and flank pain.   Physical Exam   Blood pressure 139/93, pulse 90, temperature 98.4 F (36.9 C), temperature source Oral, resp. rate 16.  Physical Exam  Nursing note and vitals reviewed. Constitutional: She is oriented to person, place, and time. She appears well-developed and well-nourished. No distress.  HENT:  Head: Normocephalic and atraumatic.  Eyes: Conjunctivae are normal. Right eye exhibits no discharge. Left eye exhibits no discharge. No scleral icterus.  Neck: Normal range of motion.  Cardiovascular: Normal rate, regular rhythm and normal heart sounds.   No murmur heard. Respiratory: Effort normal and breath sounds normal. No respiratory distress. She has no wheezes.  GI: Soft. There is no CVA tenderness.  Genitourinary: Vagina normal.  Neurological: She is alert and oriented to person, place, and time.  Skin: Skin is warm and dry. She is not diaphoretic.  Psychiatric: She has a normal mood and affect. Her behavior is normal. Judgment and thought content normal.    MAU Course  Procedures Results for orders placed or performed during the hospital encounter of 10/21/15 (from the past 24 hour(s))  Urinalysis, Routine w reflex microscopic (not at [facility])     Status: Abnormal   Collection Time: 10/21/15  3:09 PM  Result Value Ref Range   Color, Urine YELLOW YELLOW   APPearance CLEAR CLEAR   Specific Gravity, Urine <1.005 (L) 1.005 - 1.030   pH 5.5 5.0 - 8.0   Glucose, UA 250 (A) NEGATIVE mg/dL   Hgb urine dipstick SMALL (A) NEGATIVE   Bilirubin Urine NEGATIVE NEGATIVE   Ketones, ur NEGATIVE NEGATIVE mg/dL   Protein, ur NEGATIVE NEGATIVE mg/dL   Nitrite NEGATIVE NEGATIVE   Leukocytes, UA MODERATE (A) NEGATIVE  Urine microscopic-add on      Status: Abnormal   Collection Time: 10/21/15  3:09 PM  Result Value Ref Range   Squamous Epithelial / LPF 0-5 (A) NONE SEEN   WBC, UA 6-30 0 - 5 WBC/hpf   RBC / HPF 0-5 0 - 5 RBC/hpf   Bacteria, UA NONE SEEN NONE SEEN  Pregnancy, urine POC     Status: None   Collection Time: 10/21/15  3:17 PM  Result Value Ref Range   Preg Test, Ur NEGATIVE NEGATIVE    MDM Will treat for UTI based on symptoms & u/a Urine culture sent    Assessment and  Plan  A: 1. Acute cystitis with hematuria     P; Discharge home Urine culture pending Rx keflex F/u with PCP for routine care If symptoms worsen go to PCP, urgent care, or ED  Jean Melton 10/21/2015, 3:53 PM

## 2015-10-24 LAB — URINE CULTURE

## 2015-10-26 ENCOUNTER — Observation Stay (HOSPITAL_COMMUNITY)
Admission: EM | Admit: 2015-10-26 | Discharge: 2015-10-27 | Disposition: A | Discharge status: Home / Self Care | Payer: Medicaid Other | Attending: Internal Medicine | Admitting: Internal Medicine

## 2015-10-26 ENCOUNTER — Encounter (HOSPITAL_COMMUNITY): Payer: Self-pay | Admitting: Emergency Medicine

## 2015-10-26 DIAGNOSIS — I10 Essential (primary) hypertension: Secondary | ICD-10-CM | POA: Diagnosis not present

## 2015-10-26 DIAGNOSIS — K921 Melena: Secondary | ICD-10-CM | POA: Diagnosis not present

## 2015-10-26 DIAGNOSIS — K625 Hemorrhage of anus and rectum: Secondary | ICD-10-CM | POA: Diagnosis present

## 2015-10-26 DIAGNOSIS — E7800 Pure hypercholesterolemia, unspecified: Secondary | ICD-10-CM | POA: Insufficient documentation

## 2015-10-26 DIAGNOSIS — E785 Hyperlipidemia, unspecified: Secondary | ICD-10-CM | POA: Insufficient documentation

## 2015-10-26 DIAGNOSIS — Z7982 Long term (current) use of aspirin: Secondary | ICD-10-CM | POA: Diagnosis not present

## 2015-10-26 DIAGNOSIS — K922 Gastrointestinal hemorrhage, unspecified: Secondary | ICD-10-CM | POA: Diagnosis not present

## 2015-10-26 DIAGNOSIS — Z90710 Acquired absence of both cervix and uterus: Secondary | ICD-10-CM | POA: Diagnosis not present

## 2015-10-26 DIAGNOSIS — Z79899 Other long term (current) drug therapy: Secondary | ICD-10-CM | POA: Insufficient documentation

## 2015-10-26 DIAGNOSIS — Z9049 Acquired absence of other specified parts of digestive tract: Secondary | ICD-10-CM | POA: Insufficient documentation

## 2015-10-26 DIAGNOSIS — Z7984 Long term (current) use of oral hypoglycemic drugs: Secondary | ICD-10-CM | POA: Insufficient documentation

## 2015-10-26 DIAGNOSIS — E119 Type 2 diabetes mellitus without complications: Secondary | ICD-10-CM | POA: Insufficient documentation

## 2015-10-26 DIAGNOSIS — K219 Gastro-esophageal reflux disease without esophagitis: Secondary | ICD-10-CM | POA: Diagnosis not present

## 2015-10-26 DIAGNOSIS — J449 Chronic obstructive pulmonary disease, unspecified: Secondary | ICD-10-CM | POA: Diagnosis not present

## 2015-10-26 LAB — CBC
HCT: 37.4 % (ref 36.0–46.0)
HEMOGLOBIN: 11.9 g/dL — AB (ref 12.0–15.0)
MCH: 26.7 pg (ref 26.0–34.0)
MCHC: 31.8 g/dL (ref 30.0–36.0)
MCV: 83.9 fL (ref 78.0–100.0)
Platelets: 468 10*3/uL — ABNORMAL HIGH (ref 150–400)
RBC: 4.46 MIL/uL (ref 3.87–5.11)
RDW: 14.6 % (ref 11.5–15.5)
WBC: 11.7 10*3/uL — ABNORMAL HIGH (ref 4.0–10.5)

## 2015-10-26 LAB — COMPREHENSIVE METABOLIC PANEL
ALT: 11 U/L — ABNORMAL LOW (ref 14–54)
ANION GAP: 7 (ref 5–15)
AST: 13 U/L — ABNORMAL LOW (ref 15–41)
Albumin: 4.2 g/dL (ref 3.5–5.0)
Alkaline Phosphatase: 53 U/L (ref 38–126)
BUN: 16 mg/dL (ref 6–20)
CALCIUM: 10.1 mg/dL (ref 8.9–10.3)
CHLORIDE: 102 mmol/L (ref 101–111)
CO2: 26 mmol/L (ref 22–32)
Creatinine, Ser: 0.82 mg/dL (ref 0.44–1.00)
GFR calc non Af Amer: >60 mL/min (ref >60)
Glucose, Bld: 120 mg/dL — ABNORMAL HIGH (ref 65–99)
Potassium: 4.4 mmol/L (ref 3.5–5.1)
SODIUM: 135 mmol/L (ref 135–145)
Total Bilirubin: 0.1 mg/dL — ABNORMAL LOW (ref 0.3–1.2)
Total Protein: 8.4 g/dL — ABNORMAL HIGH (ref 6.5–8.1)

## 2015-10-26 LAB — TYPE AND SCREEN
ABO/RH(D): B POS
Antibody Screen: NEGATIVE

## 2015-10-26 LAB — DIFFERENTIAL
BASOS PCT: 0 %
Basophils Absolute: 0.1 10*3/uL (ref 0.0–0.1)
EOS ABS: 0.3 10*3/uL (ref 0.0–0.7)
EOS PCT: 2 %
Lymphocytes Relative: 25 %
Lymphs Abs: 2.9 10*3/uL (ref 0.7–4.0)
MONO ABS: 0.8 10*3/uL (ref 0.1–1.0)
Monocytes Relative: 7 %
NEUTROS ABS: 7.6 10*3/uL (ref 1.7–7.7)
Neutrophils Relative %: 66 %

## 2015-10-26 LAB — HEMOGLOBIN AND HEMATOCRIT, BLOOD
HEMATOCRIT: 33.7 % — AB (ref 36.0–46.0)
Hemoglobin: 10.9 g/dL — ABNORMAL LOW (ref 12.0–15.0)

## 2015-10-26 LAB — POC OCCULT BLOOD, ED: FECAL OCCULT BLD: POSITIVE — AB

## 2015-10-26 LAB — ABO/RH: ABO/RH(D): B POS

## 2015-10-26 MED ORDER — HYDRALAZINE HCL 20 MG/ML IJ SOLN
20.0000 mg | Freq: Four times a day (QID) | INTRAMUSCULAR | Status: DC | PRN
  Administered 2015-10-26: 20 mg via INTRAVENOUS
  Filled 2015-10-26: qty 1

## 2015-10-26 MED ORDER — PANTOPRAZOLE SODIUM 40 MG PO TBEC
40.0000 mg | DELAYED_RELEASE_TABLET | Freq: Every day | ORAL | Status: DC
  Administered 2015-10-26 – 2015-10-27 (×2): 40 mg via ORAL
  Filled 2015-10-26 (×2): qty 1

## 2015-10-26 MED ORDER — LISINOPRIL 20 MG PO TABS
20.0000 mg | ORAL_TABLET | Freq: Every day | ORAL | Status: DC
  Administered 2015-10-27: 20 mg via ORAL
  Filled 2015-10-26: qty 1

## 2015-10-26 MED ORDER — OXYBUTYNIN CHLORIDE ER 10 MG PO TB24
10.0000 mg | ORAL_TABLET | Freq: Every day | ORAL | Status: DC
  Administered 2015-10-26 – 2015-10-27 (×2): 10 mg via ORAL
  Filled 2015-10-26 (×2): qty 1

## 2015-10-26 MED ORDER — HYDROCODONE-ACETAMINOPHEN 5-325 MG PO TABS: 1.0000 | ORAL_TABLET | ORAL | Status: DC | PRN

## 2015-10-26 MED ORDER — INSULIN ASPART 100 UNIT/ML ~~LOC~~ SOLN: 0.0000 [IU] | Freq: Three times a day (TID) | SUBCUTANEOUS | Status: DC

## 2015-10-26 MED ORDER — ALBUTEROL SULFATE HFA 108 (90 BASE) MCG/ACT IN AERS: 2.0000 | INHALATION_SPRAY | RESPIRATORY_TRACT | Status: DC | PRN

## 2015-10-26 MED ORDER — ACETAMINOPHEN 325 MG PO TABS
650.0000 mg | ORAL_TABLET | Freq: Four times a day (QID) | ORAL | Status: DC | PRN
Start: 2015-10-26 — End: 2015-10-27
  Administered 2015-10-27: 650 mg via ORAL
  Filled 2015-10-26: qty 2

## 2015-10-26 MED ORDER — ATORVASTATIN CALCIUM 20 MG PO TABS
20.0000 mg | ORAL_TABLET | Freq: Every day | ORAL | Status: DC
  Administered 2015-10-26: 20 mg via ORAL
  Filled 2015-10-26 (×2): qty 1

## 2015-10-26 MED ORDER — CYCLOSPORINE 0.05 % OP EMUL
1.0000 [drp] | Freq: Two times a day (BID) | OPHTHALMIC | Status: DC
  Administered 2015-10-26 – 2015-10-27 (×2): 1 [drp] via OPHTHALMIC
  Filled 2015-10-26 (×2): qty 1

## 2015-10-26 MED ORDER — SODIUM CHLORIDE 0.9% FLUSH
3.0000 mL | Freq: Two times a day (BID) | INTRAVENOUS | Status: DC
  Administered 2015-10-26 – 2015-10-27 (×2): 3 mL via INTRAVENOUS

## 2015-10-26 MED ORDER — HYDROCHLOROTHIAZIDE 12.5 MG PO CAPS
12.5000 mg | ORAL_CAPSULE | Freq: Every day | ORAL | Status: DC
  Administered 2015-10-27: 12.5 mg via ORAL
  Filled 2015-10-26: qty 1

## 2015-10-26 MED ORDER — ACETAMINOPHEN 650 MG RE SUPP: 650.0000 mg | Freq: Four times a day (QID) | RECTAL | Status: DC | PRN

## 2015-10-26 MED ORDER — ALBUTEROL SULFATE (2.5 MG/3ML) 0.083% IN NEBU: 2.5000 mg | INHALATION_SOLUTION | RESPIRATORY_TRACT | Status: DC | PRN

## 2015-10-26 MED ORDER — QUINAPRIL-HYDROCHLOROTHIAZIDE 20-12.5 MG PO TABS: 1.0000 | ORAL_TABLET | Freq: Every day | ORAL | Status: DC

## 2015-10-26 MED ORDER — ONDANSETRON HCL 4 MG/2ML IJ SOLN: 4.0000 mg | Freq: Four times a day (QID) | INTRAMUSCULAR | Status: DC | PRN

## 2015-10-26 NOTE — H&P (Signed)
[facility] History and Physical  Jean Melton:[id number] DOB: 09-30-1960 DOA: 10/26/2015  PCP: Leanor Rubenstein, MD   Chief Complaint: BRBPR  HPI: Jean Melton is a 55 y.o. woman with a history of Type 2 DM, HTN, HLD, and GERD who feels that she was in her baseline state of health until this morning when she had two episodes of BRBPR. The patient thought she need to have a bowel movement both times and passes unspecified quantities of bright red blood both times.  She does not recall seeing clots.  This has never happened before.  She does not have a history of hemorrhoids.  She nausea, vomiting, or abdominal pain.  No light headedness or syncope.  No recent travel, new exposures, or known sick contacts.  She has had a normal colonoscopy in the past (was told to follow-up in 10 years).  She completed a three day course of Bactrim approximately two weeks ago for pelvic pain attributed to acute cystitis/UTI.  She is currently hemodynamically stable with a hemoglobin of 12.  Hospitalist asked to admit for further evaluation and treatment.  Dr. Dulce Sellar with [facility] has been consulted from the ED.  Review of Systems: 12 systems reviewed and negative except as stated in the HPI.  Past Medical History  Diagnosis Date  . Asthma   . Hypertension   . GERD (gastroesophageal reflux disease)   . Depression   . Diabetes mellitus (HCC)   . Hyperlipidemia    Past Surgical History  Procedure Laterality Date  . Cholecystectomy    . Abdominal surgery    . Abdominal hysterectomy    . Colonoscopy  02/02/2012    Procedure: COLONOSCOPY;  Surgeon: Vertell Novak., MD;  Location: [facility];  Service: Endoscopy;  Laterality: N/A;  . Stomach surgery    . Eye surgery    . Cesarean section     Social History:  Social History   Social History Narrative  Occasional EtOH.  No tobacco or illicit drug use.  She has five children.  No Known Allergies  Family History  Problem Relation Age of  Onset  . Diabetes Father   She denies any known family history of GI malignancy.  Prior to Admission medications   Medication Sig Start Date End Date Taking? Authorizing Provider  albuterol (PROVENTIL HFA) 108 (90 BASE) MCG/ACT inhaler Inhale 2 puffs into the lungs every 4 (four) hours as needed. Cough/wheezing 05/11/15  Yes Historical Provider, MD  aspirin 81 MG tablet Take 81 mg by mouth daily.   Yes Historical Provider, MD  atorvastatin (LIPITOR) 20 MG tablet Take 20 mg by mouth daily.   Yes Historical Provider, MD  cetirizine (ZYRTEC) 10 MG tablet Take 10 mg by mouth daily as needed for allergies.  05/11/15  Yes Historical Provider, MD  clobetasol cream (TEMOVATE) 0.05 % Apply 1 application topically 2 (two) times daily. 05/11/15  Yes Historical Provider, MD  clotrimazole (LOTRIMIN) 1 % cream Apply 1 application topically 2 (two) times daily as needed (for rash).  05/11/15  Yes Historical Provider, MD  cycloSPORINE (RESTASIS) 0.05 % ophthalmic emulsion Place 1 drop into both eyes 2 (two) times daily. 05/12/15  Yes Historical Provider, MD  diclofenac sodium (VOLTAREN) 1 % GEL Apply 2-4 g topically 4 (four) times daily as needed (for pain).  05/12/15  Yes Historical Provider, MD  Fluticasone-Salmeterol (ADVAIR DISKUS) 250-50 MCG/DOSE AEPB Inhale 1 puff into the lungs 2 (two) times daily. 05/11/15  Yes Historical Provider, MD  omeprazole (PRILOSEC) 20 MG capsule Take 20 mg by mouth daily. 05/11/15  Yes Historical Provider, MD  oxybutynin (DITROPAN-XL) 10 MG 24 hr tablet Take 10 mg by mouth daily.   Yes Historical Provider, MD  quinapril-hydrochlorothiazide (ACCURETIC) 20-12.5 MG per tablet Take 1 tablet by mouth daily.   Yes Historical Provider, MD  rosuvastatin (CRESTOR) 10 MG tablet Take 10 mg by mouth daily.   Yes Historical Provider, MD  SitaGLIPtin-MetFORMIN HCl 50-1000 MG TB24 Take 2 tablets by mouth daily.   Yes Historical Provider, MD   Physical Exam: Filed Vitals:   10/26/15 1637 10/26/15  1839 10/26/15 1913 10/26/15 1927  BP: 138/96 154/98 173/118 181/98  Pulse: 82 71 79 64  Temp:    98.2 F (36.8 C)  TempSrc:    Oral  Resp: 18 18 17 20   Height:    5\' 6"  (1.676 m)  Weight:    78.4 kg (172 lb 13.5 oz)  SpO2: 95% 100% 99% 99%     General:  Awake and alert.  Oriented to person, place, time and situation.  NAD.  Head: [place]/AT  Eyes: PERRL bilaterally, conjunctiva are pink.  EOMI.  ENT: Mucous membranes are moist.  No nasal drainage.  Neck is supple.  Cardiovascular: NR/RR.  No LE edema.  No carotid bruit.  Respiratory: CTA bilaterally.  GI: Abdomen is soft/NT/ND.  Bowel sounds are present.  No guarding.  Skin: Warm and dry.  Musculoskeletal: Moves all four extremities spontaneously.  Psychiatric: Normal affect.  Neurologic: No focal deficits.  Labs on Admission:  Basic Metabolic Panel:  Recent Labs Lab 10/26/15 1200  NA 135  K 4.4  CL 102  CO2 26  GLUCOSE 120*  BUN 16  CREATININE 0.82  CALCIUM 10.1   Liver Function Tests:  Recent Labs Lab 10/26/15 1200  AST 13*  ALT 11*  ALKPHOS 53  BILITOT 0.1*  PROT 8.4*  ALBUMIN 4.2   CBC:  Recent Labs Lab 10/26/15 1200  WBC 11.7*  NEUTROABS 7.6  HGB 11.9*  HCT 37.4  MCV 83.9  PLT 468*   Assessment/Plan Active Problems:   Diabetes mellitus, new onset (HCC)   HTN (hypertension)   Acute lower GI bleeding  Admit for observation, telemetry  Acute lower GI bleeding --GI consult pending (Dr. Hinda Lenis) --H/H q6h, monitor for transfusion requirement --Clear liquid diet for now, NPO after midnight  HTN --Continue home medication as tolerated --IV hydralazine PRN  DM --Hold oral medications --Glucose monitoring AC/HS with SSI  Code Status: FULL Family Communication: Patient alone at time of admission Disposition Plan: Expect 1-2 day hospital stay  Time spent: 40 minutes  Jerene Bears [facility]  10/26/2015, 7:54 PM

## 2015-10-26 NOTE — ED Provider Notes (Signed)
CSN: [id number]     Arrival date & time 10/26/15  1143 History   First MD Initiated Contact with Patient 10/26/15 1438     Chief Complaint  Patient presents with  . Rectal Bleeding     (Consider location/radiation/quality/duration/timing/severity/associated sxs/prior Treatment) HPI    Patient is a 55 year old female past medical history of hypertension, GERD, diabetes, asthma and hyperlipidemia, She states that she takes baby aspirin and frequently uses Aleve for headaches, she presents with 2 bowel movements this morning with large amount of blood that "fill the bowl with blood."  She denies melena, states there was streaking bright red blood present on toilet paper, however she is unable to describe stool because it was obscured by all the blood. She has mild associated rectal irritation, mild tenesmus.  She denies hemorrhoids, abdominal pain, nausea, vomiting, fever, chills, sweats. She denies pallor, fatigue, exertional dyspnea, chest pain, near syncope, palpitations.  She denies history of constipation, straining, rectal pain, anal fissures. She did see Dr. Carman Ching with GI, normal colonoscopy in 2013 for screening, with ten-year follow-up.  She has no other complaints at this time.  Past Medical History  Diagnosis Date  . Asthma   . Hypertension   . GERD (gastroesophageal reflux disease)   . Depression   . Diabetes mellitus (HCC)   . Hyperlipidemia    Past Surgical History  Procedure Laterality Date  . Cholecystectomy    . Abdominal surgery    . Abdominal hysterectomy    . Colonoscopy  02/02/2012    Procedure: COLONOSCOPY;  Surgeon: Vertell Novak., MD;  Location: [facility];  Service: Endoscopy;  Laterality: N/A;  . Stomach surgery    . Eye surgery    . Cesarean section     Family History  Problem Relation Age of Onset  . Diabetes Father    Social History  Substance Use Topics  . Smoking status: Never Smoker   . Smokeless tobacco: Never Used  . Alcohol Use:  Yes   OB History    Gravida Para Term Preterm AB TAB SAB Ectopic Multiple Living   4 4 4      1 5      Review of Systems  All other systems reviewed and are negative.     Allergies  Review of patient's allergies indicates no known allergies.  Home Medications   Prior to Admission medications   Medication Sig Start Date End Date Taking? Authorizing Provider  albuterol (PROVENTIL HFA) 108 (90 BASE) MCG/ACT inhaler Inhale 2 puffs into the lungs every 4 (four) hours as needed. Cough/wheezing 05/11/15  Yes Historical Provider, MD  aspirin 81 MG tablet Take 81 mg by mouth daily.   Yes Historical Provider, MD  atorvastatin (LIPITOR) 20 MG tablet Take 20 mg by mouth daily.   Yes Historical Provider, MD  cetirizine (ZYRTEC) 10 MG tablet Take 10 mg by mouth daily as needed for allergies.  05/11/15  Yes Historical Provider, MD  clobetasol cream (TEMOVATE) 0.05 % Apply 1 application topically 2 (two) times daily. 05/11/15  Yes Historical Provider, MD  clotrimazole (LOTRIMIN) 1 % cream Apply 1 application topically 2 (two) times daily as needed (for rash).  05/11/15  Yes Historical Provider, MD  cycloSPORINE (RESTASIS) 0.05 % ophthalmic emulsion Place 1 drop into both eyes 2 (two) times daily. 05/12/15  Yes Historical Provider, MD  diclofenac sodium (VOLTAREN) 1 % GEL Apply 2-4 g topically 4 (four) times daily as needed (for pain).  05/12/15  Yes Historical Provider,  MD  Fluticasone-Salmeterol (ADVAIR DISKUS) 250-50 MCG/DOSE AEPB Inhale 1 puff into the lungs 2 (two) times daily. 05/11/15  Yes Historical Provider, MD  omeprazole (PRILOSEC) 20 MG capsule Take 20 mg by mouth daily. 05/11/15  Yes Historical Provider, MD  oxybutynin (DITROPAN-XL) 10 MG 24 hr tablet Take 10 mg by mouth daily.   Yes Historical Provider, MD  quinapril-hydrochlorothiazide (ACCURETIC) 20-12.5 MG per tablet Take 1 tablet by mouth daily.   Yes Historical Provider, MD  rosuvastatin (CRESTOR) 10 MG tablet Take 10 mg by mouth daily.    Yes Historical Provider, MD  SitaGLIPtin-MetFORMIN HCl 50-1000 MG TB24 Take 2 tablets by mouth daily.   Yes Historical Provider, MD  ondansetron (ZOFRAN) 4 MG tablet Take 1 tablet (4 mg total) by mouth every 6 (six) hours. Patient not taking: Reported on 10/26/2015 05/22/15   Catha Gosselin, PA-C  sulfamethoxazole-trimethoprim (BACTRIM DS,SEPTRA DS) 800-160 MG tablet Take 1 tablet by mouth 2 (two) times daily. Patient not taking: Reported on 10/26/2015 10/21/15   Judeth Horn, NP  traMADol (ULTRAM) 50 MG tablet Take 1 tablet (50 mg total) by mouth every 6 (six) hours as needed. Patient not taking: Reported on 10/26/2015 05/22/15   Hanna Patel-Mills, PA-C   BP 138/96 mmHg  Pulse 82  Temp(Src) 98.1 F (36.7 C) (Oral)  Resp 18  SpO2 95% Physical Exam  Constitutional: She is oriented to person, place, and time. She appears well-developed and well-nourished. No distress.  Well appearing female appears stated age, in NAD  HENT:  Head: Normocephalic and atraumatic.  Nose: Nose normal.  Mouth/Throat: Oropharynx is clear and moist. No oropharyngeal exudate.  Eyes: Conjunctivae and EOM are normal. Pupils are equal, round, and reactive to light. Right eye exhibits no discharge. Left eye exhibits no discharge. No scleral icterus.  Mild conjunctival pallor  Neck: Normal range of motion. No JVD present. No tracheal deviation present. No thyromegaly present.  Cardiovascular: Normal rate, regular rhythm, normal heart sounds and intact distal pulses.  Exam reveals no gallop and no friction rub.   No murmur heard. Pulmonary/Chest: Effort normal and breath sounds normal. No respiratory distress. She has no wheezes. She has no rales. She exhibits no tenderness.  Abdominal: Soft. Bowel sounds are normal. She exhibits no distension and no mass. There is no tenderness. There is no rebound and no guarding.  Genitourinary: Rectal exam shows tenderness. Rectal exam shows no external hemorrhoid, no internal  hemorrhoid, no fissure, no mass and anal tone normal. Guaiac positive stool.  DRE revealed soft maroon to dark red colored stool, grossly positive for blood, Hemoccult-positive  Musculoskeletal: Normal range of motion. She exhibits no edema or tenderness.  Lymphadenopathy:    She has no cervical adenopathy.  Neurological: She is alert and oriented to person, place, and time. She has normal reflexes. No cranial nerve deficit. She exhibits normal muscle tone. Coordination normal.  Skin: Skin is warm and dry. No rash noted. She is not diaphoretic. No erythema. No pallor.  Psychiatric: She has a normal mood and affect. Her behavior is normal. Judgment and thought content normal.  Nursing note and vitals reviewed.   ED Course  Procedures (including critical care time) Labs Review Labs Reviewed  COMPREHENSIVE METABOLIC PANEL - Abnormal; Notable for the following:    Glucose, Bld 120 (*)    Total Protein 8.4 (*)    AST 13 (*)    ALT 11 (*)    Total Bilirubin 0.1 (*)    All other components within normal limits  CBC - Abnormal; Notable for the following:    WBC 11.7 (*)    Hemoglobin 11.9 (*)    Platelets 468 (*)    All other components within normal limits  POC OCCULT BLOOD, ED - Abnormal; Notable for the following:    Fecal Occult Bld POSITIVE (*)    All other components within normal limits  DIFFERENTIAL  TYPE AND SCREEN  ABO/RH    Imaging Review No results found. I have personally reviewed and evaluated these images and lab results as part of my medical decision-making.   EKG Interpretation None      MDM   Patient is a 55 year old female presents to the ER with complaint of 2 bloody bowel movements today.  Patient's hemoglobin is 11.9, consistent with history of mild anemia, mildly decreased from 05/2015 - 12.6. Patient denies fatigue, chest pain, shortness of breath, palpitations, near syncope.  She appears comfortable, VSS, she has not had any further BM's in the ER,  although she feels like she needs to have another BM.  Rectal exam negative for hemorrhoids, anal fissure, masses, positive for mild rectal tenderness and rectal vault full of soft maroon colored stool, suspected frank blood/hematochezia, confirmed with hemoccult.  Pt requested to leave and follow up with her GI doctor.  Case discussed with Dr. Dalene Seltzer, who personally saw and evaluated pt and encouraged her to stay for obs admission and inpatient GI eval.  Gi consult pending, pt needs colonoscopy given hematochezia Risk factors ASA and frequent NSAID use 5:02 PM Spoke with GI oncall, Dr. Cleda Clarks - states they will consult and see patient tomorrow am.   Hospitalist called for admission - Dr. Montez Morita to admit to tele obs.   Final diagnoses:  Hematochezia        Danelle Berry, PA-C 10/26/15 1702  Alvira Monday, MD 10/28/15 [phone]

## 2015-10-26 NOTE — ED Notes (Signed)
Pt c/o rectal bleeding since this morning. Pt sts she has had two bowel movements today with "a whole lot of blood."  Pt sts blood was bright red. Denies dizziness, lightheadedness, weakness. Denies abdominal pain. A&Ox4 and ambulatory.

## 2015-10-26 NOTE — ED Notes (Signed)
Patient states she passed bright red blood per rectum this morning in her stool.  Patient was treated for UTI last week and received 3 days of abx, which she has finished.  Patient denies abdominal pain and N/V/D.  She denies vaginal discharge and hematuria.  Patient denies melena.  Her last colonoscopy was 3-4 years ago and she states it was normal.  She states the GI doctor told her he'd "see me in 10 years."  Patient's abdomen is soft and non-tender to palpation.

## 2015-10-26 NOTE — Discharge Instructions (Signed)
Discontinue aspirin use until evaluated by PCP and/or GI.  Seek follow up ASAP.    Gastrointestinal Bleeding Gastrointestinal (GI) bleeding means there is bleeding somewhere along the digestive tract, between the mouth and anus. CAUSES  There are many different problems that can cause GI bleeding. Possible causes include:  Esophagitis. This is inflammation, irritation, or swelling of the esophagus.  Hemorrhoids.These are veins that are full of blood (engorged) in the rectum. They cause pain, inflammation, and may bleed.  Anal fissures.These are areas of painful tearing which may bleed. They are often caused by passing hard stool.  Diverticulosis.These are pouches that form on the colon over time, with age, and may bleed significantly.  Diverticulitis.This is inflammation in areas with diverticulosis. It can cause pain, fever, and bloody stools, although bleeding is rare.  Polyps and cancer. Colon cancer often starts out as precancerous polyps.  Gastritis and ulcers.Bleeding from the upper gastrointestinal tract (near the stomach) may travel through the intestines and produce black, sometimes tarry, often bad smelling stools. In certain cases, if the bleeding is fast enough, the stools may not be black, but red. This condition may be life-threatening. SYMPTOMS   Vomiting bright red blood or material that looks like coffee grounds.  Bloody, black, or tarry stools. DIAGNOSIS  Your caregiver may diagnose your condition by taking your history and performing a physical exam. More tests may be needed, including:  X-rays and other imaging tests.  Esophagogastroduodenoscopy (EGD). This test uses a flexible, lighted tube to look at your esophagus, stomach, and small intestine.  Colonoscopy. This test uses a flexible, lighted tube to look at your colon. TREATMENT  Treatment depends on the cause of your bleeding.   For bleeding from the esophagus, stomach, small intestine, or colon, the  caregiver doing your EGD or colonoscopy may be able to stop the bleeding as part of the procedure.  Inflammation or infection of the colon can be treated with medicines.  Many rectal problems can be treated with creams, suppositories, or warm baths.  Surgery is sometimes needed.  Blood transfusions are sometimes needed if you have lost a lot of blood. If bleeding is slow, you may be allowed to go home. If there is a lot of bleeding, you will need to stay in the hospital for observation. HOME CARE INSTRUCTIONS   Take any medicines exactly as prescribed.  Keep your stools soft by eating foods that are high in fiber. These foods include whole grains, legumes, fruits, and vegetables. Prunes (1 to 3 a day) work well for many people.  Drink enough fluids to keep your urine clear or pale yellow. SEEK IMMEDIATE MEDICAL CARE IF:   Your bleeding increases.  You feel lightheaded, weak, or you faint.  You have severe cramps in your back or abdomen.  You pass large blood clots in your stool.  Your problems are getting worse. MAKE SURE YOU:   Understand these instructions.  Will watch your condition.  Will get help right away if you are not doing well or get worse.   This information is not intended to replace advice given to you by your health care provider. Make sure you discuss any questions you have with your health care provider.   Document Released: 06/17/2000 Document Revised: 06/06/2012 Document Reviewed: 12/08/2014 Elsevier Interactive Patient Education [employer]

## 2015-10-27 DIAGNOSIS — K921 Melena: Secondary | ICD-10-CM | POA: Diagnosis not present

## 2015-10-27 DIAGNOSIS — E785 Hyperlipidemia, unspecified: Secondary | ICD-10-CM | POA: Diagnosis not present

## 2015-10-27 DIAGNOSIS — I10 Essential (primary) hypertension: Secondary | ICD-10-CM | POA: Diagnosis not present

## 2015-10-27 DIAGNOSIS — K219 Gastro-esophageal reflux disease without esophagitis: Secondary | ICD-10-CM

## 2015-10-27 DIAGNOSIS — E118 Type 2 diabetes mellitus with unspecified complications: Secondary | ICD-10-CM

## 2015-10-27 DIAGNOSIS — E119 Type 2 diabetes mellitus without complications: Secondary | ICD-10-CM | POA: Insufficient documentation

## 2015-10-27 DIAGNOSIS — E7800 Pure hypercholesterolemia, unspecified: Secondary | ICD-10-CM | POA: Insufficient documentation

## 2015-10-27 LAB — GLUCOSE, CAPILLARY
GLUCOSE-CAPILLARY: 120 mg/dL — AB (ref 65–99)
GLUCOSE-CAPILLARY: 92 mg/dL (ref 65–99)
Glucose-Capillary: 115 mg/dL — ABNORMAL HIGH (ref 65–99)

## 2015-10-27 LAB — BASIC METABOLIC PANEL
Anion gap: 5 (ref 5–15)
BUN: 13 mg/dL (ref 6–20)
CALCIUM: 9.8 mg/dL (ref 8.9–10.3)
CO2: 27 mmol/L (ref 22–32)
CREATININE: 0.83 mg/dL (ref 0.44–1.00)
Chloride: 108 mmol/L (ref 101–111)
GFR calc non Af Amer: >60 mL/min (ref >60)
Glucose, Bld: 152 mg/dL — ABNORMAL HIGH (ref 65–99)
Potassium: 4.3 mmol/L (ref 3.5–5.1)
Sodium: 140 mmol/L (ref 135–145)

## 2015-10-27 LAB — HEMOGLOBIN AND HEMATOCRIT, BLOOD
HEMATOCRIT: 37 % (ref 36.0–46.0)
Hemoglobin: 11.7 g/dL — ABNORMAL LOW (ref 12.0–15.0)

## 2015-10-27 LAB — OCCULT BLOOD X 1 CARD TO LAB, STOOL
FECAL OCCULT BLD: NEGATIVE
Fecal Occult Bld: NEGATIVE

## 2015-10-27 MED ORDER — OMEPRAZOLE 20 MG PO CPDR: 40.0000 mg | DELAYED_RELEASE_CAPSULE | Freq: Every day | ORAL | Status: DC

## 2015-10-27 NOTE — Discharge Summary (Signed)
Physician Discharge Summary  Jean Melton YJE:[id number] DOB: March 12, 1961 DOA: 10/26/2015  PCP: Leanor Rubenstein, MD  Admit date: 10/26/2015 Discharge date: 10/27/2015  Time spent: 35 minutes  Recommendations for Outpatient Follow-up:  Repeat BMET to follow electrolytes and renal function  Repeat CBC to follow Hgb trend Please reassess BP and adjust medications as needed    Discharge Diagnoses:  Hematochezia/Lower GI Diabetes mellitus type 2 (HCC) HTN (hypertension) HLD GERD Asthma/COPD  Discharge Condition: stable and improved. Discharge home with instructions to follow up with PCP in 10 days and with Dr. Randa Evens (gastroenterologist) in 3 weeks.  Diet recommendation: soft heart healthy/modified carbohydrates diet   Filed Weights   10/26/15 1927  Weight: 78.4 kg (172 lb 13.5 oz)    History of present illness:  As per H&P written by Dr. Montez Morita on 10/26/15 55 y.o. woman with a history of Type 2 DM, HTN, HLD, and GERD who feels that she was in her baseline state of health until this morning when she had two episodes of BRBPR. The patient thought she need to have a bowel movement both times and passes unspecified quantities of bright red blood both times. She does not recall seeing clots. This has never happened before. She does not have a history of hemorrhoids. She nausea, vomiting, or abdominal pain. No light headedness or syncope. No recent travel, new exposures, or known sick contacts. She has had a normal colonoscopy in the past (was told to follow-up in 10 years). She completed a three day course of Bactrim approximately two weeks ago for pelvic pain attributed to acute cystitis/UTI.  Hospital Course:  Hematochezia/lower GI bleeding: self limited -GI consulted and suspect most likely due to internal hemorrhoids  -recommended soft diet and avoidance of NSAID's -patient to follow with Dr. Randa Evens in 3 weeks or so -no further GI work up needed as inpatient -Hgb 11.7 at  discharge   HTN -Continue home medication as tolerated -advise to follow heart healthy diet   DM type 2 -continue home medications -outpatient follow up with PCP -advise to follow low carb diet  GERD -continue PPI  Hx of Asthma/COPD: -stable and w/o wheezing or SOB -continue home inhaler regimen   HLD -continue statins   Recent UTI -treated with Bactrim for 3 days -patient has completed antibiotic therapy prior to admission -currently w/o dysuria and afebrile     Procedures:  See below for x-ray reports   Consultations:  GI (Dr. Dulce Sellar)  Discharge Exam: Filed Vitals:   10/26/15 2027 10/27/15 0520  BP: 163/82 151/91  Pulse: 65 91  Temp: 98.2 F (36.8 C) 98 F (36.7 C)  Resp: 20 20    General: afebrile, denies any further episodes of hematochezia and denies dysuria. Patient w/o SOB or CP Cardiovascular: S1 and S2, no rubs or gallops Respiratory: CTA bilaterally Abd: soft, NT, ND, positive BS  Discharge Instructions   Discharge Instructions    Diet - low sodium heart healthy    Complete by:  As directed      Discharge instructions    Complete by:  As directed   Arrange follow up with PCP in 10 days Take medications as prescribed Follow a soft diet Maintain adequate hydration Call GI office and set up follow up with Dr. Randa Evens in 3 weeks  Please stop the use of oral NSAID's, other than your baby Aspirin (Aleve, Naproxen, Advil, Excedrin, Ibuprofen, Motrin, Goody Powder, etc....)          Current Discharge Medication List  CONTINUE these medications which have CHANGED   Details  omeprazole (PRILOSEC) 20 MG capsule Take 2 capsules (40 mg total) by mouth daily. Qty: 60 capsule, Refills: 1      CONTINUE these medications which have NOT CHANGED   Details  albuterol (PROVENTIL HFA) 108 (90 BASE) MCG/ACT inhaler Inhale 2 puffs into the lungs every 4 (four) hours as needed. Cough/wheezing    aspirin 81 MG tablet Take 81 mg by mouth daily.     atorvastatin (LIPITOR) 20 MG tablet Take 20 mg by mouth daily.    cetirizine (ZYRTEC) 10 MG tablet Take 10 mg by mouth daily as needed for allergies.     clobetasol cream (TEMOVATE) 0.05 % Apply 1 application topically 2 (two) times daily.    clotrimazole (LOTRIMIN) 1 % cream Apply 1 application topically 2 (two) times daily as needed (for rash).     cycloSPORINE (RESTASIS) 0.05 % ophthalmic emulsion Place 1 drop into both eyes 2 (two) times daily.    diclofenac sodium (VOLTAREN) 1 % GEL Apply 2-4 g topically 4 (four) times daily as needed (for pain).     Fluticasone-Salmeterol (ADVAIR DISKUS) 250-50 MCG/DOSE AEPB Inhale 1 puff into the lungs 2 (two) times daily.    oxybutynin (DITROPAN-XL) 10 MG 24 hr tablet Take 10 mg by mouth daily.    quinapril-hydrochlorothiazide (ACCURETIC) 20-12.5 MG per tablet Take 1 tablet by mouth daily.    SitaGLIPtin-MetFORMIN HCl 50-1000 MG TB24 Take 2 tablets by mouth daily.      STOP taking these medications     rosuvastatin (CRESTOR) 10 MG tablet        No Known Allergies Follow-up Information    Follow up with Leanor Rubenstein, MD. Schedule an appointment as soon as possible for a visit in 10 days.   Specialty:  Family Medicine   Contact information:   [phone] [address] [place] [address] [phone]       Follow up with EDWARDS Burna Mortimer, MD.   Specialty:  Gastroenterology   Why:  call for follow up in 3 weeks or so   Contact information:   [address] [place] [address] [phone]       The results of significant diagnostics from this hospitalization (including imaging, microbiology, ancillary and laboratory) are listed below for reference.    Significant Diagnostic Studies: No results found.  Microbiology: Recent Results (from the past 240 hour(s))  Urine culture     Status: Abnormal   Collection Time: 10/21/15  3:09 PM  Result Value Ref Range Status   Specimen Description URINE, CLEAN  CATCH  Final   Special Requests NONE  Final   Culture (A)  Final    60,000 COLONIES/mL STAPHYLOCOCCUS SPECIES (COAGULASE NEGATIVE)   Report Status 10/24/2015 FINAL  Final   Organism ID, Bacteria STAPHYLOCOCCUS SPECIES (COAGULASE NEGATIVE) (A)  Final      Susceptibility   Staphylococcus species (coagulase negative) - MIC*    CIPROFLOXACIN <=0.5 SENSITIVE Sensitive     GENTAMICIN <=0.5 SENSITIVE Sensitive     NITROFURANTOIN <=16 SENSITIVE Sensitive     OXACILLIN 0.5 SENSITIVE Sensitive     TETRACYCLINE <=1 SENSITIVE Sensitive     VANCOMYCIN <=0.5 SENSITIVE Sensitive     TRIMETH/SULFA <=10 SENSITIVE Sensitive     CLINDAMYCIN >=8 RESISTANT Resistant     RIFAMPIN <=0.5 SENSITIVE Sensitive     Inducible Clindamycin NEGATIVE Sensitive     * 60,000 COLONIES/mL STAPHYLOCOCCUS SPECIES (COAGULASE NEGATIVE)  Labs: Basic Metabolic Panel:  Recent Labs Lab 10/26/15 1200 10/27/15 0138  NA 135 140  K 4.4 4.3  CL 102 108  CO2 26 27  GLUCOSE 120* 152*  BUN 16 13  CREATININE 0.82 0.83  CALCIUM 10.1 9.8   Liver Function Tests:  Recent Labs Lab 10/26/15 1200  AST 13*  ALT 11*  ALKPHOS 53  BILITOT 0.1*  PROT 8.4*  ALBUMIN 4.2   CBC:  Recent Labs Lab 10/26/15 1200 10/26/15 2040 10/27/15 0138  WBC 11.7*  --   --   NEUTROABS 7.6  --   --   HGB 11.9* 10.9* 11.7*  HCT 37.4 33.7* 37.0  MCV 83.9  --   --   PLT 468*  --   --    CBG:  Recent Labs Lab 10/27/15 0049 10/27/15 0753 10/27/15 1205  GLUCAP 115* 120* 92     Signed:  Vassie Loll MD.  [facility] 10/27/2015, 2:17 PM

## 2015-10-27 NOTE — Consult Note (Signed)
[facility] Consultation Note  Referring Provider:  Dr. Vassie Loll [facility]) Primary Care Physician:  Leanor Rubenstein, MD Primary Gastroenterologist:  Dr. Carman Ching  Reason for Consultation:  Blood in stool   HPI: Jean Melton is a 55 y.o. female admitted for blood in stool.  Yesterday, had acute onset of a couple episodes of lower abdominal cramps and red blood in stool with clots.  Mild interval anemia.  Blood in stool has resolved.  She currently has no abdominal pain, blood in stool, change in bowel habits, loss-of-appetite, unintentional weight loss.  Screening colonoscopy by Dr. Randa Evens in October 2013 was completely normal.  Bleeding symptoms started soon after taking course of antibiotics for UTI; she has no diarrhea.   Past Medical History  Diagnosis Date  . Asthma   . Hypertension   . GERD (gastroesophageal reflux disease)   . Depression   . Diabetes mellitus (HCC)   . Hyperlipidemia     Past Surgical History  Procedure Laterality Date  . Cholecystectomy    . Abdominal surgery    . Abdominal hysterectomy    . Colonoscopy  02/02/2012    Procedure: COLONOSCOPY;  Surgeon: Vertell Novak., MD;  Location: [facility];  Service: Endoscopy;  Laterality: N/A;  . Stomach surgery    . Eye surgery    . Cesarean section      Prior to Admission medications   Medication Sig Start Date End Date Taking? Authorizing Provider  albuterol (PROVENTIL HFA) 108 (90 BASE) MCG/ACT inhaler Inhale 2 puffs into the lungs every 4 (four) hours as needed. Cough/wheezing 05/11/15  Yes Historical Provider, MD  aspirin 81 MG tablet Take 81 mg by mouth daily.   Yes Historical Provider, MD  atorvastatin (LIPITOR) 20 MG tablet Take 20 mg by mouth daily.   Yes Historical Provider, MD  cetirizine (ZYRTEC) 10 MG tablet Take 10 mg by mouth daily as needed for allergies.  05/11/15  Yes Historical Provider, MD  clobetasol cream (TEMOVATE) 0.05 % Apply 1 application topically 2 (two) times daily.  05/11/15  Yes Historical Provider, MD  clotrimazole (LOTRIMIN) 1 % cream Apply 1 application topically 2 (two) times daily as needed (for rash).  05/11/15  Yes Historical Provider, MD  cycloSPORINE (RESTASIS) 0.05 % ophthalmic emulsion Place 1 drop into both eyes 2 (two) times daily. 05/12/15  Yes Historical Provider, MD  diclofenac sodium (VOLTAREN) 1 % GEL Apply 2-4 g topically 4 (four) times daily as needed (for pain).  05/12/15  Yes Historical Provider, MD  Fluticasone-Salmeterol (ADVAIR DISKUS) 250-50 MCG/DOSE AEPB Inhale 1 puff into the lungs 2 (two) times daily. 05/11/15  Yes Historical Provider, MD  omeprazole (PRILOSEC) 20 MG capsule Take 20 mg by mouth daily. 05/11/15  Yes Historical Provider, MD  oxybutynin (DITROPAN-XL) 10 MG 24 hr tablet Take 10 mg by mouth daily.   Yes Historical Provider, MD  quinapril-hydrochlorothiazide (ACCURETIC) 20-12.5 MG per tablet Take 1 tablet by mouth daily.   Yes Historical Provider, MD  rosuvastatin (CRESTOR) 10 MG tablet Take 10 mg by mouth daily.   Yes Historical Provider, MD  SitaGLIPtin-MetFORMIN HCl 50-1000 MG TB24 Take 2 tablets by mouth daily.   Yes Historical Provider, MD    Current Facility-Administered Medications  Medication Dose Route Frequency Provider Last Rate Last Dose  . acetaminophen (TYLENOL) tablet 650 mg  650 mg Oral Q6H PRN Michael Litter, MD   650 mg at 10/27/15 0529   Or  . acetaminophen (TYLENOL) suppository 650 mg  650  mg Rectal Q6H PRN Michael Litter, MD      . albuterol (PROVENTIL) (2.5 MG/3ML) 0.083% nebulizer solution 2.5 mg  2.5 mg Nebulization Q4H PRN Michael Litter, MD      . atorvastatin (LIPITOR) tablet 20 mg  20 mg Oral q1800 Michael Litter, MD   20 mg at 10/26/15 2044  . cycloSPORINE (RESTASIS) 0.05 % ophthalmic emulsion 1 drop  1 drop Both Eyes BID Michael Litter, MD   1 drop at 10/27/15 0943  . hydrALAZINE (APRESOLINE) injection 20 mg  20 mg Intravenous Q6H PRN Michael Litter, MD   20 mg at 10/26/15 2043  . lisinopril  (PRINIVIL,ZESTRIL) tablet 20 mg  20 mg Oral Daily Michael Litter, MD   20 mg at 10/27/15 0944   And  . hydrochlorothiazide (MICROZIDE) capsule 12.5 mg  12.5 mg Oral Daily Michael Litter, MD   12.5 mg at 10/27/15 0944  . HYDROcodone-acetaminophen (NORCO/VICODIN) 5-325 MG per tablet 1-2 tablet  1-2 tablet Oral Q4H PRN Michael Litter, MD      . insulin aspart (novoLOG) injection 0-15 Units  0-15 Units Subcutaneous TID WC Michael Litter, MD   0 Units at 10/27/15 0800  . ondansetron (ZOFRAN) injection 4 mg  4 mg Intravenous Q6H PRN Michael Litter, MD      . oxybutynin (DITROPAN-XL) 24 hr tablet 10 mg  10 mg Oral Daily Michael Litter, MD   10 mg at 10/27/15 0944  . pantoprazole (PROTONIX) EC tablet 40 mg  40 mg Oral Daily Michael Litter, MD   40 mg at 10/27/15 0944  . sodium chloride flush (NS) 0.9 % injection 3 mL  3 mL Intravenous Q12H Michael Litter, MD   3 mL at 10/27/15 0944    Allergies as of 10/26/2015  . (No Known Allergies)    Family History  Problem Relation Age of Onset  . Diabetes Father     Social History   Social History  . Marital Status: Single    Spouse Name: N/A  . Number of Children: N/A  . Years of Education: N/A   Occupational History  . Not on file.   Social History Main Topics  . Smoking status: Never Smoker   . Smokeless tobacco: Never Used  . Alcohol Use: Yes  . Drug Use: No  . Sexual Activity: Yes    Birth Control/ Protection: Surgical   Other Topics Concern  . Not on file   Social History Narrative    Review of Systems: Positive = bold Gen: Denies any fever, chills, rigors, night sweats, anorexia, fatigue, weakness, malaise, involuntary weight loss, and sleep disorder CV: Denies chest pain, angina, palpitations, syncope, orthopnea, PND, peripheral edema, and claudication. Resp: Denies dyspnea, cough, sputum, wheezing, coughing up blood. GI: Described in detail in HPI.    GU : Denies urinary burning (recent UTI), blood in urine, urinary frequency, urinary  hesitancy, nocturnal urination, and urinary incontinence. MS: Denies joint pain or swelling.  Denies muscle weakness, cramps, atrophy.  Derm: Denies rash, itching, oral ulcerations, hives, unhealing ulcers.  Psych: Denies depression, anxiety, memory loss, suicidal ideation, hallucinations,  and confusion. Heme: Denies bruising, bleeding, and enlarged lymph nodes. Neuro:  Denies any headaches, dizziness, paresthesias. Endo:  Denies any problems with DM, thyroid, adrenal function.  Physical Exam: Vital signs in last 24 hours: Temp:  [98 F (36.7 C)-98.2 F (36.8 C)] 98 F (36.7 C) (04/25 0520) Pulse Rate:  [64-91] 91 (04/25 0520) Resp:  [17-20] 20 (04/25 0520) BP: (127-181)/(82-118) 151/91 mmHg (04/25 0520)  SpO2:  [95 %-100 %] 100 % (04/25 0520) Weight:  [78.4 kg (172 lb 13.5 oz)] 78.4 kg (172 lb 13.5 oz) (04/24 1927) Last BM Date: 10/27/15 General:   Alert,  Overweight, Well-developed, well-nourished, pleasant and cooperative in NAD Head:  Normocephalic and atraumatic. Eyes:  Sclera clear, no icterus.   Conjunctiva pink. Ears:  Normal auditory acuity. Nose:  No deformity, discharge,  or lesions. Mouth:  No deformity or lesions.  Oropharynx pink & moist. Neck:  Supple; no masses or thyromegaly. Lungs:  Clear throughout to auscultation.   No wheezes, crackles, or rhonchi. No acute distress. Heart:  Regular rate and rhythm; no murmurs, clicks, rubs,  or gallops. Abdomen:  Soft, protuberant, nontender and nondistended. No masses, hepatosplenomegaly or hernias noted. Normal bowel sounds, without guarding, and without rebound.     Msk:  Symmetrical without gross deformities. Normal posture. Pulses:  Normal pulses noted. Extremities:  Without clubbing or edema. Neurologic:  Alert and  oriented x4;  grossly normal neurologically. Skin:  Intact without significant lesions or rashes. Cervical Nodes:  No significant cervical adenopathy. Psych:  Alert and cooperative. Normal mood and  affect.   Lab Results:  Recent Labs  10/26/15 1200 10/26/15 2040 10/27/15 0138  WBC 11.7*  --   --   HGB 11.9* 10.9* 11.7*  HCT 37.4 33.7* 37.0  PLT 468*  --   --    BMET  Recent Labs  10/26/15 1200 10/27/15 0138  NA 135 140  K 4.4 4.3  CL 102 108  CO2 26 27  GLUCOSE 120* 152*  BUN 16 13  CREATININE 0.82 0.83  CALCIUM 10.1 9.8   LFT  Recent Labs  10/26/15 1200  PROT 8.4*  ALBUMIN 4.2  AST 13*  ALT 11*  ALKPHOS 53  BILITOT 0.1*   PT/INR No results for input(s): LABPROT, INR in the last 72 hours.  Studies/Results: No results found.  Impression:  1.  Hematochezia, resolved.  Normal colonoscopy in October 2013.  May be rectal outlet (hemorrhoidal) or subtle diverticular bleeding (although no diverticula were seen on patient's colonoscopy October 2013).  May have been mild self-limited ischemic colitis.  Regardless, patient's symptoms have resolved. 2.  Anemia, mild.  Plan:  1.  Soft diet, advance as tolerated. 2.  Don't see need for any GI tract evaluation at the present time. 3.  Patient will need to follow-up with Dr. Randa Evens [phone] within 3-4 weeks of hospital discharge. 4.  Patient can be discharged home from GI perspective. 5.  Will sign-off; please call with questions; thank you for the consultation.     Freddy Jaksch  10/27/2015, 1:13 PM  Pager [phone] If no answer or after 5 PM call [phone]

## 2016-04-01 ENCOUNTER — Other Ambulatory Visit: Payer: Self-pay | Admitting: Family

## 2016-04-01 DIAGNOSIS — Z1231 Encounter for screening mammogram for malignant neoplasm of breast: Secondary | ICD-10-CM

## 2016-04-20 ENCOUNTER — Ambulatory Visit
Admission: RE | Admit: 2016-04-20 | Discharge: 2016-04-20 | Disposition: A | Discharge status: Home / Self Care | Payer: Medicaid Other | Source: Ambulatory Visit | Attending: Family | Admitting: Family

## 2016-04-20 DIAGNOSIS — Z1231 Encounter for screening mammogram for malignant neoplasm of breast: Secondary | ICD-10-CM

## 2016-05-24 ENCOUNTER — Ambulatory Visit: Payer: Medicaid Other | Admitting: Advanced Practice Midwife

## 2016-10-28 DIAGNOSIS — M222X2 Patellofemoral disorders, left knee: Secondary | ICD-10-CM

## 2016-10-28 DIAGNOSIS — M222X1 Patellofemoral disorders, right knee: Secondary | ICD-10-CM | POA: Insufficient documentation

## 2016-11-11 ENCOUNTER — Ambulatory Visit: Payer: Medicaid Other | Admitting: Physical Therapy

## 2016-11-18 ENCOUNTER — Ambulatory Visit: Payer: Medicaid Other | Admitting: Physical Therapy

## 2016-11-23 ENCOUNTER — Ambulatory Visit: Payer: Medicaid Other | Admitting: Physical Therapy

## 2016-11-30 ENCOUNTER — Ambulatory Visit: Payer: Medicaid Other | Admitting: Physical Therapy

## 2016-12-06 ENCOUNTER — Ambulatory Visit: Payer: Medicaid Other | Attending: Orthopedic Surgery | Admitting: Physical Therapy

## 2016-12-06 ENCOUNTER — Encounter: Payer: Self-pay | Admitting: Physical Therapy

## 2016-12-06 DIAGNOSIS — M25561 Pain in right knee: Secondary | ICD-10-CM | POA: Insufficient documentation

## 2016-12-06 DIAGNOSIS — G8929 Other chronic pain: Secondary | ICD-10-CM | POA: Diagnosis present

## 2016-12-06 DIAGNOSIS — M222X2 Patellofemoral disorders, left knee: Secondary | ICD-10-CM | POA: Insufficient documentation

## 2016-12-06 DIAGNOSIS — M222X1 Patellofemoral disorders, right knee: Secondary | ICD-10-CM | POA: Diagnosis not present

## 2016-12-06 DIAGNOSIS — M25562 Pain in left knee: Secondary | ICD-10-CM | POA: Diagnosis present

## 2016-12-06 DIAGNOSIS — R262 Difficulty in walking, not elsewhere classified: Secondary | ICD-10-CM | POA: Diagnosis present

## 2016-12-07 ENCOUNTER — Encounter: Payer: Self-pay | Admitting: Physical Therapy

## 2016-12-07 NOTE — Therapy (Signed)
[facility] [address] [place], [place], [address] Phone: [phone]   Fax:  [phone]  Physical Therapy Evaluation  Patient Details  Name: Jean Melton MRN: [id number] Date of Birth: December 11, 1960 Referring Provider: Dr Dannielle Huh   Encounter Date: 12/06/2016      PT End of Session - 12/07/16 1252    Visit Number 1   Number of Visits 1   Authorization Type medicaid evaluation    PT Start Time 0847   PT Stop Time 0930   PT Time Calculation (min) 43 min   Activity Tolerance Patient tolerated treatment well   Behavior During Therapy [facility] for tasks assessed/performed      Past Medical History:  Diagnosis Date  . Asthma   . Depression   . Diabetes mellitus (HCC)   . GERD (gastroesophageal reflux disease)   . Hyperlipidemia   . Hypertension     Past Surgical History:  Procedure Laterality Date  . ABDOMINAL HYSTERECTOMY    . ABDOMINAL SURGERY    . CESAREAN SECTION    . CHOLECYSTECTOMY    . COLONOSCOPY  02/02/2012   Procedure: COLONOSCOPY;  Surgeon: Vertell Novak., MD;  Location: [facility];  Service: Endoscopy;  Laterality: N/A;  . EYE SURGERY    . STOMACH SURGERY      There were no vitals filed for this visit.       Subjective Assessment - 12/06/16 0851    Subjective Patient has pain in both knees R > L. Her pain is worse when she is going up stairs. Sh has been having increased pain over time during the day. She has crepitus in bilateral knees.    Limitations Standing;Walking  stairs    Currently in Pain? Yes   Pain Score 1   Sometimes to a 4-5/10    Pain Location Knee   Pain Orientation Anterior   Pain Descriptors / Indicators Aching   Pain Type Chronic pain   Pain Onset More than a month ago   Pain Frequency Intermittent   Aggravating Factors  stairs, standing, walking    Pain Relieving Factors rest    Effect of Pain on Daily Activities difficulty fgoing up and down stairs    Multiple Pain  Sites No            [facility] PT Assessment - 12/07/16 0001      Assessment   Medical Diagnosis Patella femoral syndrome    Referring Provider Dr Dannielle Huh    Onset Date/Surgical Date --  Has had pain for several years    Hand Dominance Right   Next MD Visit Seeing as needed    Prior Therapy None      Precautions   Precautions None     Restrictions   Weight Bearing Restrictions No     Balance Screen   Has the patient fallen in the past 6 months No   Has the patient had a decrease in activity level because of a fear of falling?  No   Is the patient reluctant to leave their home because of a fear of falling?  No     Home Environment   Additional Comments 3 steps to get into the house      Prior Function   Level of Independence Independent   Leisure Gym,      Cognition   Overall Cognitive Status Within Functional Limits for tasks assessed   Attention Focused   Focused Attention Appears intact  Memory Appears intact   Awareness Appears intact   Problem Solving Appears intact     Observation/Other Assessments   Observations lateral patella tracking    Focus on Therapeutic Outcomes (FOTO)  1x visit      Sensation   Light Touch Appears Intact     Coordination   Gross Motor Movements are Fluid and Coordinated Yes   Fine Motor Movements are Fluid and Coordinated Yes     AROM   Overall AROM Comments Active right knee extension -6      PROM   Overall PROM Comments full PROM with pain with end range knee flexion      Strength   Strength Assessment Site Hip;Knee   Right/Left Hip Right;Left   Right Hip Flexion 4+/5   Right Hip ABduction 4+/5   Right Hip ADduction 5/5   Left Hip Flexion 4+/5   Left Hip ABduction 5/5   Left Hip ADduction 4+/5   Right/Left Knee Right;Left   Right Knee Flexion 5/5   Right Knee Extension 5/5   Left Knee Flexion 5/5   Left Knee Extension 5/5     Flexibility   Soft Tissue Assessment /Muscle Length yes   Hamstrings 90/90  hamstring 30 degrees from straight      Palpation   Palpation comment mild tenderness to palpation in the right anterior knee             Objective measurements completed on examination: See above findings.                    PT Short Term Goals - 12/07/16 1258      PT SHORT TERM GOAL #1   Title Patient will be independent with HEP    Time 4   Period Weeks   Status New     PT SHORT TERM GOAL #2   Title Patient will vocalize the improtance of symptom mangement and activity progression    Time 4   Period Weeks   Status New                   Plan - 12/07/16 1252    Clinical Impression Statement Patient is a 56 year old female with bilateral knee pain right > left. She has increased knee pain with walking. She has minor hip weakness and quad weakness. She is also lacking full extension on the right side. Her patellas track lateral. She has crepitus and positive patellar compression test. She would benefit from skilled therapy to improve strength and pateallar tracking but only recieved 1 visit per medicaid guaidlines. Therapy gave her a full program to work through. She is very motivated.    Clinical Presentation Stable   Clinical Decision Making Low   Rehab Potential Good   PT Frequency 2x / week   PT Duration 8 weeks   PT Treatment/Interventions ADLs/Self Care Home Management;Cryotherapy;Electrical Stimulation;Iontophoresis 4mg /ml Dexamethasone;Stair training;Gait training;Moist Heat;Ultrasound;Therapeutic activities;Therapeutic exercise;Neuromuscular re-education;Patient/family education;Passive range of motion;Manual techniques;Splinting;Taping   PT Next Visit Plan 1x visit    PT Home Exercise Plan see patient instructions    Consulted and Agree with Plan of Care Patient      Patient will benefit from skilled therapeutic intervention in order to improve the following deficits and impairments:  Abnormal gait, Pain, Decreased mobility, Decreased  strength, Decreased activity tolerance, Decreased endurance, Increased muscle spasms, Difficulty walking, Decreased range of motion  Visit Diagnosis: Patellofemoral pain syndrome of both knees - Plan: PT plan of care cert/re-cert  Chronic pain of right knee - Plan: PT plan of care cert/re-cert  Chronic pain of left knee - Plan: PT plan of care cert/re-cert  Difficulty in walking, not elsewhere classified - Plan: PT plan of care cert/re-cert     Problem List Patient Active Problem List   Diagnosis Date Noted  . Hematochezia   . Esophageal reflux   . HLD (hyperlipidemia)   . Type 2 diabetes mellitus with complication, without long-term current use of insulin (HCC)   . Acute lower GI bleeding 10/26/2015  . Hyperosmolarity due to secondary diabetes (HCC) 09/11/2011  . Diabetes mellitus, new onset (HCC) 09/09/2011  . Hyponatremia 09/09/2011  . Hyperkalemia 09/09/2011  . HTN (hypertension) 09/09/2011  . Dehydration 09/09/2011  . GERD (gastroesophageal reflux disease) 09/09/2011  . Asthma 09/09/2011    Dessie Coma  PT DPT  12/07/2016, 1:11 PM  [facility] [address] [place], [place], [address] Phone: [phone]   Fax:  [phone]  Name: Jean Melton MRN: [id number] Date of Birth: 1961/02/10

## 2017-02-16 ENCOUNTER — Ambulatory Visit: Payer: Medicaid Other | Admitting: Obstetrics & Gynecology

## 2017-03-08 ENCOUNTER — Other Ambulatory Visit: Payer: Self-pay | Admitting: Family

## 2017-03-15 ENCOUNTER — Ambulatory Visit: Payer: Medicaid Other | Admitting: Obstetrics & Gynecology

## 2017-03-15 ENCOUNTER — Other Ambulatory Visit: Payer: Self-pay | Admitting: Family

## 2017-03-15 DIAGNOSIS — R1012 Left upper quadrant pain: Secondary | ICD-10-CM

## 2017-03-24 ENCOUNTER — Ambulatory Visit
Admission: RE | Admit: 2017-03-24 | Discharge: 2017-03-24 | Disposition: A | Discharge status: Home / Self Care | Payer: Medicaid Other | Source: Ambulatory Visit | Attending: Family | Admitting: Family

## 2017-03-24 DIAGNOSIS — R1012 Left upper quadrant pain: Secondary | ICD-10-CM

## 2017-05-02 ENCOUNTER — Other Ambulatory Visit: Payer: Self-pay | Admitting: Family

## 2017-05-02 DIAGNOSIS — Z1231 Encounter for screening mammogram for malignant neoplasm of breast: Secondary | ICD-10-CM

## 2017-05-05 DIAGNOSIS — M7631 Iliotibial band syndrome, right leg: Secondary | ICD-10-CM | POA: Insufficient documentation

## 2017-05-30 ENCOUNTER — Other Ambulatory Visit: Payer: Self-pay | Admitting: Nurse Practitioner

## 2017-05-30 DIAGNOSIS — Z1231 Encounter for screening mammogram for malignant neoplasm of breast: Secondary | ICD-10-CM

## 2017-06-12 ENCOUNTER — Telehealth: Payer: Self-pay | Admitting: *Deleted

## 2017-06-12 NOTE — Telephone Encounter (Signed)
Telephoned patient to make her aware the office will be opening tomorrow at 10 am.  No answer left voicemail message.  Explained we would not be able to see her tomorrow.  Told patient we could see her on Thursday 06/15/17 at 1240.  Encouraged patient to call the office in the morning to reschedule if the Thursday appointment doesn't work for her.

## 2017-06-13 ENCOUNTER — Ambulatory Visit: Payer: Medicaid Other | Admitting: Obstetrics & Gynecology

## 2017-06-15 ENCOUNTER — Ambulatory Visit: Payer: Medicaid Other | Admitting: Obstetrics & Gynecology

## 2017-07-05 ENCOUNTER — Ambulatory Visit: Payer: Medicaid Other

## 2017-07-06 ENCOUNTER — Ambulatory Visit
Admission: RE | Admit: 2017-07-06 | Discharge: 2017-07-06 | Disposition: A | Discharge status: Home / Self Care | Payer: Medicaid Other | Source: Ambulatory Visit | Attending: Nurse Practitioner | Admitting: Nurse Practitioner

## 2017-07-06 DIAGNOSIS — Z1231 Encounter for screening mammogram for malignant neoplasm of breast: Secondary | ICD-10-CM

## 2017-07-13 ENCOUNTER — Ambulatory Visit: Payer: Medicaid Other | Admitting: Obstetrics & Gynecology

## 2017-07-18 ENCOUNTER — Encounter: Payer: Self-pay | Admitting: Obstetrics & Gynecology

## 2017-07-18 ENCOUNTER — Ambulatory Visit: Payer: Medicaid Other | Admitting: Obstetrics & Gynecology

## 2017-07-18 VITALS — BP 114/85 | HR 111 | Wt 176.1 lb

## 2017-07-18 DIAGNOSIS — Z0000 Encounter for general adult medical examination without abnormal findings: Secondary | ICD-10-CM

## 2017-07-18 DIAGNOSIS — Z01419 Encounter for gynecological examination (general) (routine) without abnormal findings: Secondary | ICD-10-CM | POA: Diagnosis not present

## 2017-07-18 DIAGNOSIS — F3181 Bipolar II disorder: Secondary | ICD-10-CM | POA: Insufficient documentation

## 2017-07-18 DIAGNOSIS — M1990 Unspecified osteoarthritis, unspecified site: Secondary | ICD-10-CM

## 2017-07-18 NOTE — Patient Instructions (Signed)
Menopause Menopause is the normal time of life when menstrual periods stop completely. Menopause is complete when you have missed 12 consecutive menstrual periods. It usually occurs between the ages of 48 years and 55 years. Very rarely does a woman develop menopause before the age of 40 years. At menopause, your ovaries stop producing the female hormones estrogen and progesterone. This can cause undesirable symptoms and also affect your health. Sometimes the symptoms may occur 4-5 years before the menopause begins. There is no relationship between menopause and:  Oral contraceptives.  Number of children you had.  Race.  The age your menstrual periods started (menarche).  Heavy smokers and very thin women may develop menopause earlier in life. What are the causes?  The ovaries stop producing the female hormones estrogen and progesterone. Other causes include:  Surgery to remove both ovaries.  The ovaries stop functioning for no known reason.  Tumors of the pituitary gland in the brain.  Medical disease that affects the ovaries and hormone production.  Radiation treatment to the abdomen or pelvis.  Chemotherapy that affects the ovaries.  What are the signs or symptoms?  Hot flashes.  Night sweats.  Decrease in sex drive.  Vaginal dryness and thinning of the vagina causing painful intercourse.  Dryness of the skin and developing wrinkles.  Headaches.  Tiredness.  Irritability.  Memory problems.  Weight gain.  Bladder infections.  Hair growth of the face and chest.  Infertility. More serious symptoms include:  Loss of bone (osteoporosis) causing breaks (fractures).  Depression.  Hardening and narrowing of the arteries (atherosclerosis) causing heart attacks and strokes.  How is this diagnosed?  When the menstrual periods have stopped for 12 straight months.  Physical exam.  Hormone studies of the blood. How is this treated? There are many treatment  choices and nearly as many questions about them. The decisions to treat or not to treat menopausal changes is an individual choice made with your health care provider. Your health care provider can discuss the treatments with you. Together, you can decide which treatment will work best for you. Your treatment choices may include:  Hormone therapy (estrogen and progesterone).  Non-hormonal medicines.  Treating the individual symptoms with medicine (for example antidepressants for depression).  Herbal medicines that may help specific symptoms.  Counseling by a psychiatrist or psychologist.  Group therapy.  Lifestyle changes including: ? Eating healthy. ? Regular exercise. ? Limiting caffeine and alcohol. ? Stress management and meditation.  No treatment.  Follow these instructions at home:  Take the medicine your health care provider gives you as directed.  Get plenty of sleep and rest.  Exercise regularly.  Eat a diet that contains calcium (good for the bones) and soy products (acts like estrogen hormone).  Avoid alcoholic beverages.  Do not smoke.  If you have hot flashes, dress in layers.  Take supplements, calcium, and vitamin D to strengthen bones.  You can use over-the-counter lubricants or moisturizers for vaginal dryness.  Group therapy is sometimes very helpful.  Acupuncture may be helpful in some cases. Contact a health care provider if:  You are not sure you are in menopause.  You are having menopausal symptoms and need advice and treatment.  You are still having menstrual periods after age 55 years.  You have pain with intercourse.  Menopause is complete (no menstrual period for 12 months) and you develop vaginal bleeding.  You need a referral to a specialist (gynecologist, psychiatrist, or psychologist) for treatment. Get help right   away if:  You have severe depression.  You have excessive vaginal bleeding.  You fell and think you have a  broken bone.  You have pain when you urinate.  You develop leg or chest pain.  You have a fast pounding heart beat (palpitations).  You have severe headaches.  You develop vision problems.  You feel a lump in your breast.  You have abdominal pain or severe indigestion. This information is not intended to replace advice given to you by your health care provider. Make sure you discuss any questions you have with your health care provider. Document Released: 09/10/2003 Document Revised: 11/26/2015 Document Reviewed: 01/17/2013 Elsevier Interactive Patient Education  2017 Elsevier Inc.  

## 2017-07-18 NOTE — Progress Notes (Signed)
Subjective:     Jean Melton is a 57 y.o. female here for a routine exam.  Current complaints:  None. Pt reports that her primary care provider is at [facility]. Zoe Lan, MD      Gynecologic History No LMP recorded. Patient has had a hysterectomy. Contraception: status post hysterectomy Last Pap: 1990's. Results were: normal Last mammogram: 07/06/2017. Results were: normal  Obstetric History OB History  Gravida Para Term Preterm AB Living  4 4 4     5   SAB TAB Ectopic Multiple Live Births        1      # Outcome Date GA Lbr Len/2nd Weight Sex Delivery Anes PTL Lv  4 Term           3 Term           2 Term           1A Term           1B Term                The following portions of the patient's history were reviewed and updated as appropriate: allergies, current medications, past family history, past medical history, past social history, past surgical history and problem list.  Review of Systems Pertinent items are noted in HPI.    Objective:  BP 114/85   Pulse (!) 111   Wt 176 lb 1.6 oz (79.9 kg)   BMI 28.42 kg/m  General Appearance:    Alert, cooperative, no distress, appears stated age  Head:    Normocephalic, without obvious abnormality, atraumatic  Eyes:    conjunctiva/corneas clear, EOM's intact, both eyes  Ears:    Normal external ear canals, both ears  Nose:   Nares normal, septum midline, mucosa normal, no drainage    or sinus tenderness  Throat:   Lips, mucosa, and tongue normal; teeth and gums normal  Neck:   Supple, symmetrical, trachea midline, no adenopathy;    thyroid:  no enlargement/tenderness/nodules  Back:     Symmetric, no curvature, ROM normal, no CVA tenderness  Lungs:     Clear to auscultation bilaterally, respirations unlabored  Chest Wall:    No tenderness or deformity   Heart:    Regular rate and rhythm, S1 and S2 normal, no murmur, rub   or gallop  Breast Exam:    No tenderness, masses, or nipple abnormality  Abdomen:     Soft, non-tender,  bowel sounds active all four quadrants,    no masses, no organomegaly  Genitalia:    Normal female without lesion, discharge or tenderness; uterus and cervix surgically absent.      Extremities:   Extremities normal, atraumatic, no cyanosis or edema  Pulses:   2+ and symmetric all extremities  Skin:   Skin color, texture, turgor normal, no rashes or lesions      Assessment:    Healthy female exam.   Menopausal state Updated problem list.      Plan:    Follow up in: 1 year.  f/u sooner prn Yearly mammogram Reviewed menopause and menopausal sx  [name] L. Harraway-Smith, M.D., [place] [place]

## 2017-08-08 MED FILL — DULoxetine HCL 60 MG CPEP: 60 | 30 days supply | Qty: 60 | Fill #0

## 2017-08-18 ENCOUNTER — Ambulatory Visit: Payer: Self-pay | Admitting: Podiatry

## 2017-09-05 MED FILL — DULoxetine HCL 60 MG CPEP: 60 | 30 days supply | Qty: 60 | Fill #1

## 2017-09-14 ENCOUNTER — Ambulatory Visit (INDEPENDENT_AMBULATORY_CARE_PROVIDER_SITE_OTHER): Payer: Medicaid Other | Admitting: Podiatry

## 2017-09-14 ENCOUNTER — Encounter: Payer: Self-pay | Admitting: Podiatry

## 2017-09-14 VITALS — BP 126/84 | HR 106 | Resp 16

## 2017-09-14 DIAGNOSIS — M779 Enthesopathy, unspecified: Secondary | ICD-10-CM

## 2017-09-14 DIAGNOSIS — M21619 Bunion of unspecified foot: Secondary | ICD-10-CM

## 2017-09-14 DIAGNOSIS — B351 Tinea unguium: Secondary | ICD-10-CM

## 2017-09-14 NOTE — Progress Notes (Signed)
Subjective:   Patient ID: Jean Melton, female   DOB: 57 y.o.   MRN: [id number]   HPI Patient presents stating that she is concerned about her diabetes with corn callus formation discomfort in her feet and concerned about possibility for future bunion formation.  Patient has good digital perfusion and does not smoke likes to be active   Review of Systems  All other systems reviewed and are negative.       Objective:  Physical Exam  Constitutional: She appears well-developed and well-nourished.  Cardiovascular: Intact distal pulses.  Pulmonary/Chest: Effort normal.  Musculoskeletal: Normal range of motion.  Neurological: She is alert.  Skin: Skin is warm.  Nursing note and vitals reviewed.   Minimal disruption of sharp dull vibratory.  Patient did have nail disease mild discomfort feet and very minimal structural bunion deformity noted bilateral.  Patient does not have current symptoms associated with this and was noted to have good balance and no equinus condition     Assessment:  Minimal risk diabetic with good circulatory vascular neurological status with minimal bunion and tendinitis symptomatology     Plan:  H&P education rendered the patient and I have recommended wider type soft leather shoes and daily inspections of her feet.  Patient does not have any long-term sequela currently

## 2017-09-14 NOTE — Progress Notes (Signed)
   Subjective:    Patient ID: Jean Melton, female    DOB: 02-19-1961, 57 y.o.   MRN: [id number]  HPI    Review of Systems  All other systems reviewed and are negative.      Objective:   Physical Exam        Assessment & Plan:

## 2017-10-06 DIAGNOSIS — I428 Other cardiomyopathies: Secondary | ICD-10-CM | POA: Insufficient documentation

## 2017-11-02 ENCOUNTER — Ambulatory Visit (INDEPENDENT_AMBULATORY_CARE_PROVIDER_SITE_OTHER): Payer: Medicaid Other

## 2017-11-02 ENCOUNTER — Ambulatory Visit (INDEPENDENT_AMBULATORY_CARE_PROVIDER_SITE_OTHER): Payer: Medicaid Other | Admitting: Podiatry

## 2017-11-02 ENCOUNTER — Encounter: Payer: Self-pay | Admitting: Podiatry

## 2017-11-02 DIAGNOSIS — S99922A Unspecified injury of left foot, initial encounter: Secondary | ICD-10-CM

## 2017-11-02 NOTE — Progress Notes (Signed)
Subjective:   Patient ID: Jean Melton, female   DOB: 57 y.o.   MRN: [id number]   HPI Patient presents stating she hit her toes on her left foot and she is worried she broke a toe   ROS      Objective:  Physical Exam  Neurovascular status intact with edema in the left fourth digit with pain also third and fifth but not to the same degree as the fourth     Assessment:  Probability for traumatized fourth digit left with possible fracture other digits 2     Plan:  H&P x-rays reviewed and advised there is a possible fracture fourth toe but it does appear stable and patient will continue with wider shoes and not wear shoes that are too tight  X-rays were inconclusive with the possibility for fracture of the head of proximal phalanx digit 4 left

## 2018-03-29 ENCOUNTER — Ambulatory Visit: Payer: Medicaid Other | Admitting: Registered"

## 2018-04-30 ENCOUNTER — Ambulatory Visit: Payer: Medicaid Other | Admitting: Registered"

## 2018-05-11 ENCOUNTER — Ambulatory Visit: Payer: Medicaid Other | Admitting: Registered"

## 2018-05-14 ENCOUNTER — Other Ambulatory Visit: Payer: Self-pay | Admitting: Oncology

## 2018-06-14 ENCOUNTER — Encounter: Payer: Medicaid Other | Attending: Nurse Practitioner | Admitting: Registered"

## 2018-06-14 ENCOUNTER — Encounter: Payer: Self-pay | Admitting: Registered"

## 2018-06-14 DIAGNOSIS — E119 Type 2 diabetes mellitus without complications: Secondary | ICD-10-CM

## 2018-06-14 DIAGNOSIS — Z713 Dietary counseling and surveillance: Secondary | ICD-10-CM | POA: Insufficient documentation

## 2018-06-14 NOTE — Patient Instructions (Addendum)
Continue with your activities on your Wii, Goal is to add one more day per week until you are doing it daily. Continue drinking plenty of water Continue with lifestyle that is helping your heart symptoms: Continue with your goal of cutting out the caffiene Aim to eat vegetables at 2 meals per day Consider working on ways to manage stress. Some ideas: yoga, meditation, listening to music, walking in the woods

## 2018-06-14 NOTE — Progress Notes (Addendum)
Diabetes Self-Management Education  Visit Type: First/Initial  Appt. Start Time: 0800 Appt. End Time: 0900  06/14/2018  [name], identified by name and date of birth, is a 57 y.o. female with a diagnosis of Diabetes: Type 2.   ASSESSMENT Pt states she is concerned about racing heart and her cardiologist has recently started her on medication as well as advising her to lose 20 lbs per month. Pt states she is confused about calorie counting, plant-based eating and vegan diet and would like some strategies for eating. Patient states she watches 600 lb life for motivation of where she doesn't want to be. Pt reports she weights about 170 now and would like to be about 150 lbs. Per chart lipid panel values are WNL.  Pt states she feels pretty good about her diet, but feels she could eat more vegetables and fewer starches. Pt states she loves corn, but watches how much she eats.   Pt states she has a lot of stress, but has ways of dealing with it so it doesn't get to her. Pt states she switched to decaf coffee and plans to stop drinking coffee all together.   Per chart patient A1c is 6.3% and has been in a controlled range for several years. Pt takes Janumet, sitagliptin-metformin but has a goal to get off DM medication. Pt states she stays away from sugar and well as artificial sweetener.  [employer] membership but doesn't use it, loses interest in machines after 5 min. Pt reports she started using her Wii again 2x week and can lose track of time because she enjoys the activity. Pt states she wants to get into a daily exercise routine again.  Patient had questions about the value of eating organic foods.    Diabetes Self-Management Education - 06/14/18 1405      Visit Information   Visit Type  First/Initial      Initial Visit   Diabetes Type  Type 2    Are you currently following a meal plan?  No    Are you taking your medications as prescribed?  Yes      Psychosocial Assessment    Patient Belief/Attitude about Diabetes  Motivated to manage diabetes      Complications   Last HgB A1C per patient/outside source  6.3 %    How often do you check your blood sugar?  0 times/day (not testing)      Dietary Intake   Breakfast  boiled egg OR spaghetti OR fish, veg, rice    Snack (morning)  peanuts OR fruit     Lunch  salad or sandwiches, crackers    Snack (afternoon)  none    Dinner  "something light" salad, protein    Snack (evening)  fruit    Beverage(s)  decaf coffee, water, selzer water, lipton green tea occassionally      Exercise   Exercise Type  Light (walking / raking leaves)    How many days per week to you exercise?  2      Patient Education   Previous Diabetes Education  Yes (please comment)   class in 2016   Nutrition management   Role of diet in the treatment of diabetes and the relationship between the three main macronutrients and blood glucose level    Physical activity and exercise   Role of exercise on diabetes management, blood pressure control and cardiac health.    Psychosocial adjustment  Role of stress on diabetes  Individualized Goals (developed by patient)   Nutrition  General guidelines for healthy choices and portions discussed    Physical Activity  Exercise 3-5 times per week      Outcomes   Expected Outcomes  Demonstrated interest in learning. Expect positive outcomes    Future DMSE  PRN    Program Status  Completed      Individualized Plan for Diabetes Self-Management Training:   Learning Objective:  Patient will have a greater understanding of diabetes self-management. Patient education plan is to attend individual and/or group sessions per assessed needs and concerns.  Patient Instructions  Continue with your activities on your Wii, Goal is to add one more day per week until you are doing it daily. Continue drinking plenty of water Continue with lifestyle that is helping your heart symptoms: Continue with your goal of  cutting out the caffiene Aim to eat vegetables at 2 meals per day Consider working on ways to manage stress. Some ideas: yoga, meditation, listening to music, walking in the woods   Expected Outcomes:  Demonstrated interest in learning. Expect positive outcomes  Education material provided: My Plate, Clean 15, Dirty Dozen  If problems or questions, patient to contact team via:  Phone  Future DSME appointment: PRN

## 2018-06-25 ENCOUNTER — Other Ambulatory Visit: Payer: Self-pay | Admitting: Nurse Practitioner

## 2018-06-25 DIAGNOSIS — Z1231 Encounter for screening mammogram for malignant neoplasm of breast: Secondary | ICD-10-CM

## 2018-07-10 ENCOUNTER — Ambulatory Visit: Payer: Medicaid Other | Admitting: Podiatry

## 2018-07-12 ENCOUNTER — Ambulatory Visit
Admission: RE | Admit: 2018-07-12 | Discharge: 2018-07-12 | Disposition: A | Discharge status: Home / Self Care | Payer: Medicaid Other | Source: Ambulatory Visit | Attending: Nurse Practitioner | Admitting: Nurse Practitioner

## 2018-07-12 DIAGNOSIS — Z1231 Encounter for screening mammogram for malignant neoplasm of breast: Secondary | ICD-10-CM

## 2018-07-17 ENCOUNTER — Ambulatory Visit: Payer: Medicaid Other | Admitting: Podiatry

## 2018-07-17 ENCOUNTER — Encounter: Payer: Self-pay | Admitting: Podiatry

## 2018-07-17 DIAGNOSIS — E119 Type 2 diabetes mellitus without complications: Secondary | ICD-10-CM

## 2018-07-17 DIAGNOSIS — L84 Corns and callosities: Secondary | ICD-10-CM | POA: Diagnosis not present

## 2018-07-17 DIAGNOSIS — B351 Tinea unguium: Secondary | ICD-10-CM

## 2018-07-17 DIAGNOSIS — M79674 Pain in right toe(s): Secondary | ICD-10-CM | POA: Diagnosis not present

## 2018-07-17 DIAGNOSIS — M79675 Pain in left toe(s): Secondary | ICD-10-CM | POA: Diagnosis not present

## 2018-07-17 NOTE — Patient Instructions (Addendum)
Diabetes Mellitus and Foot Care Foot care is an important part of your health, especially when you have diabetes. Diabetes may cause you to have problems because of poor blood flow (circulation) to your feet and legs, which can cause your skin to:  Become thinner and drier.  Break more easily.  Heal more slowly.  Peel and crack. You may also have nerve damage (neuropathy) in your legs and feet, causing decreased feeling in them. This means that you may not notice minor injuries to your feet that could lead to more serious problems. Noticing and addressing any potential problems early is the best way to prevent future foot problems. How to care for your feet Foot hygiene  Wash your feet daily with warm water and mild soap. Do not use hot water. Then, pat your feet and the areas between your toes until they are completely dry. Do not soak your feet as this can dry your skin.  Trim your toenails straight across. Do not dig under them or around the cuticle. File the edges of your nails with an emery board or nail file.  Apply a moisturizing lotion or petroleum jelly to the skin on your feet and to dry, brittle toenails. Use lotion that does not contain alcohol and is unscented. Do not apply lotion between your toes. Shoes and socks  Wear clean socks or stockings every day. Make sure they are not too tight. Do not wear knee-high stockings since they may decrease blood flow to your legs.  Wear shoes that fit properly and have enough cushioning. Always look in your shoes before you put them on to be sure there are no objects inside.  To break in new shoes, wear them for just a few hours a day. This prevents injuries on your feet. Wounds, scrapes, corns, and calluses  Check your feet daily for blisters, cuts, bruises, sores, and redness. If you cannot see the bottom of your feet, use a mirror or ask someone for help.  Do not cut corns or calluses or try to remove them with medicine.  If you  find a minor scrape, cut, or break in the skin on your feet, keep it and the skin around it clean and dry. You may clean these areas with mild soap and water. Do not clean the area with peroxide, alcohol, or iodine.  If you have a wound, scrape, corn, or callus on your foot, look at it several times a day to make sure it is healing and not infected. Check for: ? Redness, swelling, or pain. ? Fluid or blood. ? Warmth. ? Pus or a bad smell. General instructions  Do not cross your legs. This may decrease blood flow to your feet.  Do not use heating pads or hot water bottles on your feet. They may burn your skin. If you have lost feeling in your feet or legs, you may not know this is happening until it is too late.  Protect your feet from hot and cold by wearing shoes, such as at the beach or on hot pavement.  Schedule a complete foot exam at least once a year (annually) or more often if you have foot problems. If you have foot problems, report any cuts, sores, or bruises to your health care provider immediately. Contact a health care provider if:  You have a medical condition that increases your risk of infection and you have any cuts, sores, or bruises on your feet.  You have an injury that is not   healing.  You have redness on your legs or feet.  You feel burning or tingling in your legs or feet.  You have pain or cramps in your legs and feet.  Your legs or feet are numb.  Your feet always feel cold.  You have pain around a toenail. Get help right away if:  You have a wound, scrape, corn, or callus on your foot and: ? You have pain, swelling, or redness that gets worse. ? You have fluid or blood coming from the wound, scrape, corn, or callus. ? Your wound, scrape, corn, or callus feels warm to the touch. ? You have pus or a bad smell coming from the wound, scrape, corn, or callus. ? You have a fever. ? You have a red line going up your leg. Summary  Check your feet every day  for cuts, sores, red spots, swelling, and blisters.  Moisturize feet and legs daily.  Wear shoes that fit properly and have enough cushioning.  If you have foot problems, report any cuts, sores, or bruises to your health care provider immediately.  Schedule a complete foot exam at least once a year (annually) or more often if you have foot problems. This information is not intended to replace advice given to you by your health care provider. Make sure you discuss any questions you have with your health care provider. Document Released: 06/17/2000 Document Revised: 08/02/2017 Document Reviewed: 07/22/2016 Elsevier Interactive Patient Education  2019 [employer] Onychomycosis/Fungal Toenails  WHAT IS IT? An infection that lies within the keratin of your nail plate that is caused by a fungus.  WHY ME? Fungal infections affect all ages, sexes, races, and creeds.  There may be many factors that predispose you to a fungal infection such as age, coexisting medical conditions such as diabetes, or an autoimmune disease; stress, medications, fatigue, genetics, etc.  Bottom line: fungus thrives in a warm, moist environment and your shoes offer such a location.  IS IT CONTAGIOUS? Theoretically, yes.  You do not want to share shoes, nail clippers or files with someone who has fungal toenails.  Walking around barefoot in the same room or sleeping in the same bed is unlikely to transfer the organism.  It is important to realize, however, that fungus can spread easily from one nail to the next on the same foot.  HOW DO WE TREAT THIS?  There are several ways to treat this condition.  Treatment may depend on many factors such as age, medications, pregnancy, liver and kidney conditions, etc.  It is best to ask your doctor which options are available to you.  No treatment.   Unlike many other medical concerns, you can live with this condition.  However for many people this can be a painful condition and may  lead to ingrown toenails or a bacterial infection.  It is recommended that you keep the nails cut short to help reduce the amount of fungal nail. Topical treatment.  These range from herbal remedies to prescription strength nail lacquers.  About 40-50% effective, topicals require twice daily application for approximately 9 to 12 months or until an entirely new nail has grown out.  The most effective topicals are medical grade medications available through physicians offices. Oral antifungal medications.  With an 80-90% cure rate, the most common oral medication requires 3 to 4 months of therapy and stays in your system for a year as the new nail grows out.  Oral antifungal medications do require blood work to make  sure it is a safe drug for you.  A liver function panel will be performed prior to starting the medication and after the first month of treatment.  It is important to have the blood work performed to avoid any harmful side effects.  In general, this medication safe but blood work is required. Laser Therapy.  This treatment is performed by applying a specialized laser to the affected nail plate.  This therapy is noninvasive, fast, and non-painful.  It is not covered by insurance and is therefore, out of pocket.  The results have been very good with a 80-95% cure rate.  [facility] is the only practice in the area to offer this therapy. Permanent Nail Avulsion.  Removing the entire nail so that a new nail will not grow back.  Corns and Calluses Corns are small areas of thickened skin that occur on the top, sides, or tip of a toe. They contain a cone-shaped core with a point that can press on a nerve below. This causes pain.  Calluses are areas of thickened skin that can occur anywhere on the body, including the hands, fingers, palms, soles of the feet, and heels. Calluses are usually larger than corns. What are the causes? Corns and calluses are caused by rubbing (friction) or pressure,  such as from shoes that are too tight or do not fit properly. What increases the risk? Corns are more likely to develop in people who have misshapen toes (toe deformities), such as hammer toes. Calluses can occur with friction to any area of the skin. They are more likely to develop in people who:  Work with their hands.  Wear shoes that fit poorly, are too tight, or are high-heeled.  Have toe deformities. What are the signs or symptoms? Symptoms of a corn or callus include:  A hard growth on the skin.  Pain or tenderness under the skin.  Redness and swelling.  Increased discomfort while wearing tight-fitting shoes, if your feet are affected. If a corn or callus becomes infected, symptoms may include:  Redness and swelling that gets worse.  Pain.  Fluid, blood, or pus draining from the corn or callus. How is this diagnosed? Corns and calluses may be diagnosed based on your symptoms, your medical history, and a physical exam. How is this treated? Treatment for corns and calluses may include:  Removing the cause of the friction or pressure. This may involve: ? Changing your shoes. ? Wearing shoe inserts (orthotics) or other protective layers in your shoes, such as a corn pad. ? Wearing gloves.  Applying medicine to the skin (topical medicine) to help soften skin in the hardened, thickened areas.  Removing layers of dead skin with a file to reduce the size of the corn or callus.  Removing the corn or callus with a scalpel or laser.  Taking antibiotic medicines, if your corn or callus is infected.  Having surgery, if a toe deformity is the cause. Follow these instructions at home:   Take over-the-counter and prescription medicines only as told by your health care provider.  If you were prescribed an antibiotic, take it as told by your health care provider. Do not stop taking it even if your condition starts to improve.  Wear shoes that fit well. Avoid wearing  high-heeled shoes and shoes that are too tight or too loose.  Wear any padding, protective layers, gloves, or orthotics as told by your health care provider.  Soak your hands or feet and then  use a file or pumice stone to soften your corn or callus. Do this as told by your health care provider.  Check your corn or callus every day for symptoms of infection. Contact a health care provider if you:  Notice that your symptoms do not improve with treatment.  Have redness or swelling that gets worse.  Notice that your corn or callus becomes painful.  Have fluid, blood, or pus coming from your corn or callus.  Have new symptoms. Summary  Corns are small areas of thickened skin that occur on the top, sides, or tip of a toe.  Calluses are areas of thickened skin that can occur anywhere on the body, including the hands, fingers, palms, and soles of the feet. Calluses are usually larger than corns.  Corns and calluses are caused by rubbing (friction) or pressure, such as from shoes that are too tight or do not fit properly.  Treatment may include wearing any padding, protective layers, gloves, or orthotics as told by your health care provider. This information is not intended to replace advice given to you by your health care provider. Make sure you discuss any questions you have with your health care provider. Document Released: 03/26/2004 Document Revised: 05/03/2017 Document Reviewed: 05/03/2017 Elsevier Interactive Patient Education  2019 [employer].

## 2018-07-17 NOTE — Progress Notes (Signed)
Subjective: Jean Melton presents today for diabetic foot care.  She is seen for painful, thick toenails 1-5 b/l that she cannot cut and painful plantar calluses bilaterally which interfere with daily activities.  Pain is aggravated when wearing enclosed shoe gear.  Jean Melton is her new PCP and she last saw her on 05/18/2018.   Current Outpatient Medications:  .  albuterol (PROVENTIL HFA) 108 (90 BASE) MCG/ACT inhaler, Inhale 2 puffs into the lungs every 4 (four) hours as needed. Cough/wheezing, Disp: , Rfl:  .  amLODipine (NORVASC) 10 MG tablet, TAKE ONE (1) TABLET BY MOUTH EVERY DAY, Disp: , Rfl:  .  amoxicillin (AMOXIL) 250 MG capsule, Take 250 mg by mouth 3 (three) times daily., Disp: , Rfl:  .  aspirin 81 MG tablet, Take 81 mg by mouth daily., Disp: , Rfl:  .  Aspirin-Calcium Carbonate 81-777 MG TABS, Take by mouth., Disp: , Rfl:  .  atenolol (TENORMIN) 50 MG tablet, Take by mouth., Disp: , Rfl:  .  atorvastatin (LIPITOR) 20 MG tablet, Take 20 mg by mouth daily., Disp: , Rfl:  .  betamethasone dipropionate (DIPROLENE) 0.05 % ointment, Apply to areas of hair loss on the scalp once daily 4 or 5 days per week., Disp: , Rfl:  .  betamethasone valerate ointment (VALISONE) 0.1 %, Apply to areas of hair loss on scalp; use 4 or 5 days per week., Disp: , Rfl:  .  cephALEXin (KEFLEX) 500 MG capsule, Take 500 mg by mouth 2 (two) times daily., Disp: , Rfl: 0 .  cetirizine (ZYRTEC) 10 MG tablet, Take 10 mg by mouth daily as needed for allergies. , Disp: , Rfl:  .  citalopram (CELEXA) 20 MG tablet, Take by mouth., Disp: , Rfl:  .  clobetasol cream (TEMOVATE) 0.05 %, Apply 1 application topically 2 (two) times daily., Disp: , Rfl:  .  clotrimazole (LOTRIMIN) 1 % cream, Apply 1 application topically 2 (two) times daily as needed (for rash). , Disp: , Rfl:  .  cycloSPORINE (RESTASIS) 0.05 % ophthalmic emulsion, Place 1 drop into both eyes 2 (two) times daily., Disp: , Rfl:  .  diclofenac sodium  (VOLTAREN) 1 % GEL, Apply 2-4 g topically 4 (four) times daily as needed (for pain). , Disp: , Rfl:  .  DULoxetine (CYMBALTA) 60 MG capsule, , Disp: , Rfl:  .  fluticasone (FLONASE) 50 MCG/ACT nasal spray, Place into the nose., Disp: , Rfl:  .  Fluticasone-Salmeterol (ADVAIR DISKUS) 250-50 MCG/DOSE AEPB, Inhale 1 puff into the lungs 2 (two) times daily., Disp: , Rfl:  .  glucose blood test strip, USE TO CHECK BLOOD SUGAR TWO TIMES DAILY, Disp: , Rfl:  .  glucose blood test strip, USE TO CHECK BLOOD SUGAR TWO TIMES DAILY, Disp: , Rfl:  .  glucose blood test strip, 1 each by Misc.(Non-Drug; Combo Route) route daily., Disp: , Rfl:  .  Lancets (ACCU-CHEK MULTICLIX) lancets, 1 each by Misc.(Non-Drug; Combo Route) route daily., Disp: , Rfl:  .  losartan (COZAAR) 50 MG tablet, TAKE 1 TABLET BY MOUTH ONCE DAILY, Disp: , Rfl:  .  methylPREDNISolone (MEDROL DOSEPAK) 4 MG TBPK tablet, See admin instructions., Disp: , Rfl: 0 .  metroNIDAZOLE (FLAGYL) 250 MG tablet, Take 250 mg by mouth 3 (three) times daily., Disp: , Rfl:  .  neomycin-polymyxin b-dexamethasone (MAXITROL) 3.5-10000-0.1 OINT, APPLY 1 INCH OF OINTMENT INTO RIGHT EYE AT BEDTIME, Disp: , Rfl: 0 .  omeprazole (PRILOSEC) 20 MG capsule, Take 2 capsules (  40 mg total) by mouth daily., Disp: 60 capsule, Rfl: 1 .  oxybutynin (DITROPAN-XL) 10 MG 24 hr tablet, Take 10 mg by mouth daily., Disp: , Rfl:  .  predniSONE (DELTASONE) 20 MG tablet, Take 20 mg by mouth 3 (three) times daily. for 7 days, Disp: , Rfl: 0 .  quinapril-hydrochlorothiazide (ACCURETIC) 20-12.5 MG per tablet, Take 1 tablet by mouth daily., Disp: , Rfl:  .  rosuvastatin (CRESTOR) 10 MG tablet, Take by mouth., Disp: , Rfl:  .  sitaGLIPtin-metformin (JANUMET) 50-500 MG tablet, Take 1 tablet by mouth 2 (two) times daily with a meal., Disp: , Rfl:  .  SitaGLIPtin-MetFORMIN HCl 50-1000 MG TB24, Take 2 tablets by mouth daily., Disp: , Rfl:  .  solifenacin (VESICARE) 5 MG tablet, Take by mouth.,  Disp: , Rfl:  .  sulfamethoxazole-trimethoprim (BACTRIM DS,SEPTRA DS) 800-160 MG tablet, Take 1 tablet by mouth 2 (two) times daily. for 7 days, Disp: , Rfl: 0 .  traZODone (DESYREL) 50 MG tablet, TAKE 1 2 TO 1 (ONE HALF TO ONE) TABLET BY MOUTH AT BEDTIME AS NEEDED FOR ANXIETY, Disp: , Rfl: 1 .  tretinoin (RETIN-A) 0.025 % cream, Apply a thin layer to the face every other night., Disp: , Rfl:  .  triamcinolone acetonide (KENALOG) 10 MG/ML injection, by Intra-Lesional route., Disp: , Rfl:  .  triamcinolone cream (KENALOG) 0.1 %, APPLY TO ITCHY LESIONS ON THE SKIN WHEN NEEDED. DO NOT APPLY TO FACE, UNDERARMS OR GENITALS., Disp: , Rfl:  .  triamcinolone cream (KENALOG) 0.1 %, APPLY TO ITCHY LESIONS ON THE SKIN WHEN NEEDED. DO NOT APPLY TO FACE, UNDERARMS OR GENITALS., Disp: , Rfl: 1 .  ziprasidone (GEODON) 60 MG capsule, Take 120 mg by mouth at bedtime., Disp: , Rfl:  .  ziprasidone (GEODON) 60 MG capsule, Take 60 mg by mouth 2 (two) times daily with a meal., Disp: , Rfl:   Allergies  Allergen Reactions  . Ace Inhibitors     cough  . Pollen Extract Other (See Comments)  . Sulfamethoxazole-Trimethoprim Other (See Comments)    Hospital thinks that this is what caused the the GI bleed Hospital thinks that this is what caused the the GI bleed     Objective:  Vascular Examination: Vascular Examination: Capillary refill time immediate x 10 digits Dorsalis pedis and Posterior tibial pulses present b/l Digital hair present x 10 digits  Skin temperature gradient WNL b/l  Dermatological Examination: Skin with normal turgor, texture and tone b/l  Toenails 1-5 b/l discolored, thick, dystrophic with subungual debris and pain with palpation to nailbeds due to thickness of nails.  Hyperkeratotic lesions noted submetatarsal heads 2 through 5 bilaterally, mild in nature.  There is no erythema, no edema, no drainage, no flocculence noted.  Musculoskeletal: Muscle strength 5/5 to all LE muscle  groups  Hallux abductovalgus with bunion deformity bilaterally  No pain, crepitus or joint limitation noted with ROM.   Neurological: Sensation intact with 10 gram monofilament. Vibratory sensation intact.  Assessment: Painful onychomycosis toenails 1-5 b/l  Calluses submetatarsal heads 2 through 5 bilaterally Non-insulin-dependent diabetes  Plan: 1. Toenails 1-5 b/l were debrided in length and girth without iatrogenic bleeding. 2. Calluses 2 through 5 bilaterally were pared without incident 3. She was given a prescription for 1 pair diabetic shoes for a pair of heat moldable insoles to be taken to [facility].  Supporting diagnosis include calluses submetatarsal heads 2 through 5 bilaterally, non-insulin-dependent diabetes, hallux abductovalgus with bunion deformity.  4. Patient to continue soft, supportive shoe gear 5. Patient to report any pedal injuries to medical professional immediately. 6. Follow up 3 months. Patient/POA to call should there be a concern in the interim.

## 2018-08-02 ENCOUNTER — Other Ambulatory Visit: Payer: Self-pay

## 2018-08-02 ENCOUNTER — Encounter: Payer: Self-pay | Admitting: Obstetrics & Gynecology

## 2018-08-02 ENCOUNTER — Ambulatory Visit (INDEPENDENT_AMBULATORY_CARE_PROVIDER_SITE_OTHER): Payer: Medicaid Other | Admitting: Obstetrics & Gynecology

## 2018-08-02 VITALS — BP 170/93 | HR 69 | Ht 66.0 in | Wt 172.0 lb

## 2018-08-02 DIAGNOSIS — Z01419 Encounter for gynecological examination (general) (routine) without abnormal findings: Secondary | ICD-10-CM | POA: Diagnosis not present

## 2018-08-02 DIAGNOSIS — N951 Menopausal and female climacteric states: Secondary | ICD-10-CM

## 2018-08-02 NOTE — Patient Instructions (Signed)
Menopause  Menopause is the normal time of life when menstrual periods stop completely. It is usually confirmed by 12 months without a menstrual period. The transition to menopause (perimenopause) most often happens between the ages of 45 and 55. During perimenopause, hormone levels change in your body, which can cause symptoms and affect your health. Menopause may increase your risk for:   Loss of bone (osteoporosis), which causes bone breaks (fractures).   Depression.   Hardening and narrowing of the arteries (atherosclerosis), which can cause heart attacks and strokes.  What are the causes?  This condition is usually caused by a natural change in hormone levels that happens as you get older. The condition may also be caused by surgery to remove both ovaries (bilateral oophorectomy).  What increases the risk?  This condition is more likely to start at an earlier age if you have certain medical conditions or treatments, including:   A tumor of the pituitary gland in the brain.   A disease that affects the ovaries and hormone production.   Radiation treatment for cancer.   Certain cancer treatments, such as chemotherapy or hormone (anti-estrogen) therapy.   Heavy smoking and excessive alcohol use.   Family history of early menopause.  This condition is also more likely to develop earlier in women who are very thin.  What are the signs or symptoms?  Symptoms of this condition include:   Hot flashes.   Irregular menstrual periods.   Night sweats.   Changes in feelings about sex. This could be a decrease in sex drive or an increased comfort around your sexuality.   Vaginal dryness and thinning of the vaginal walls. This may cause painful intercourse.   Dryness of the skin and development of wrinkles.   Headaches.   Problems sleeping (insomnia).   Mood swings or irritability.   Memory problems.   Weight gain.   Hair growth on the face and chest.   Bladder infections or problems with urinating.  How  is this diagnosed?  This condition is diagnosed based on your medical history, a physical exam, your age, your menstrual history, and your symptoms. Hormone tests may also be done.  How is this treated?  In some cases, no treatment is needed. You and your health care provider should make a decision together about whether treatment is necessary. Treatment will be based on your individual condition and preferences. Treatment for this condition focuses on managing symptoms. Treatment may include:   Menopausal hormone therapy (MHT).   Medicines to treat specific symptoms or complications.   Acupuncture.   Vitamin or herbal supplements.  Before starting treatment, make sure to let your health care provider know if you have a personal or family history of:   Heart disease.   Breast cancer.   Blood clots.   Diabetes.   Osteoporosis.  Follow these instructions at home:  Lifestyle   Do not use any products that contain nicotine or tobacco, such as cigarettes and e-cigarettes. If you need help quitting, ask your health care provider.   Get at least 30 minutes of physical activity on 5 or more days each week.   Avoid alcoholic and caffeinated beverages, as well as spicy foods. This may help prevent hot flashes.   Get 7-8 hours of sleep each night.   If you have hot flashes, try:  ? Dressing in layers.  ? Avoiding things that may trigger hot flashes, such as spicy food, warm places, or stress.  ? Taking slow, deep   breaths when a hot flash starts.  ? Keeping a fan in your home and office.   Find ways to manage stress, such as deep breathing, meditation, or journaling.   Consider going to group therapy with other women who are having menopause symptoms. Ask your health care provider about recommended group therapy meetings.  Eating and drinking   Eat a healthy, balanced diet that contains whole grains, lean protein, low-fat dairy, and plenty of fruits and vegetables.   Your health care provider may recommend  adding more soy to your diet. Foods that contain soy include tofu, tempeh, and soy milk.   Eat plenty of foods that contain calcium and vitamin D for bone health. Items that are rich in calcium include low-fat milk, yogurt, beans, almonds, sardines, broccoli, and kale.  Medicines   Take over-the-counter and prescription medicines only as told by your health care provider.   Talk with your health care provider before starting any herbal supplements. If prescribed, take vitamins and supplements as told by your health care provider. These may include:  ? Calcium. Women age 51 and older should get 1,200 mg (milligrams) of calcium every day.  ? Vitamin D. Women need 600-800 International Units of vitamin D each day.  ? Vitamins B12 and B6. Aim for 50 micrograms of B12 and 1.5 mg of B6 each day.  General instructions   Keep track of your menstrual periods, including:  ? When they occur.  ? How heavy they are and how long they last.  ? How much time passes between periods.   Keep track of your symptoms, noting when they start, how often you have them, and how long they last.   Use vaginal lubricants or moisturizers to help with vaginal dryness and improve comfort during sex.   Keep all follow-up visits as told by your health care provider. This is important. This includes any group therapy or counseling.  Contact a health care provider if:   You are still having menstrual periods after age 55.   You have pain during sex.   You have not had a period for 12 months and you develop vaginal bleeding.  Get help right away if:   You have:  ? Severe depression.  ? Excessive vaginal bleeding.  ? Pain when you urinate.  ? A fast or irregular heart beat (palpitations).  ? Severe headaches.  ? Abdomen (abdominal) pain or severe indigestion.   You fell and you think you have a broken bone.   You develop leg or chest pain.   You develop vision problems.   You feel a lump in your breast.  Summary   Menopause is the normal  time of life when menstrual periods stop completely. It is usually confirmed by 12 months without a menstrual period.   The transition to menopause (perimenopause) most often happens between the ages of 45 and 55.   Symptoms can be managed through medicines, lifestyle changes, and complementary therapies such as acupuncture.   Eat a balanced diet that is rich in nutrients to promote bone health and heart health and to manage symptoms during menopause.  This information is not intended to replace advice given to you by your health care provider. Make sure you discuss any questions you have with your health care provider.  Document Released: 09/10/2003 Document Revised: 07/23/2016 Document Reviewed: 07/23/2016  Elsevier Interactive Patient Education  2019 [employer]

## 2018-08-02 NOTE — Progress Notes (Signed)
Subjective:     Jean Melton is a 59 y.o. female here for a routine exam.  Pt is s/p hyst in her early 70's. Current complaints: none.      Gynecologic History No LMP recorded. Patient has had a hysterectomy. Contraception: status post hysterectomy Last Pap: s/p hyst. Last mammogram: 07/12/2018. Results were: normal  Obstetric History OB History  Gravida Para Term Preterm AB Living  4 4 4     5   SAB TAB Ectopic Multiple Live Births        1      # Outcome Date GA Lbr Len/2nd Weight Sex Delivery Anes PTL Lv  4 Term           3 Term           2 Term           1A Term           1B Term              The following portions of the patient's history were reviewed and updated as appropriate: allergies, current medications, past family history, past medical history, past social history, past surgical history and problem list.  Review of Systems Pertinent items are noted in HPI.    Objective:  BP (!) 147/98   Pulse 69   Ht 5\' 6"  (1.676 m)   Wt 172 lb (78 kg)   BMI 27.76 kg/m   General Appearance:    Alert, cooperative, no distress, appears stated age  Head:    Normocephalic, without obvious abnormality, atraumatic  Eyes:    conjunctiva/corneas clear, EOM's intact, both eyes  Ears:    Normal external ear canals, both ears  Nose:   Nares normal, septum midline, mucosa normal, no drainage    or sinus tenderness  Throat:   Lips, mucosa, and tongue normal; teeth and gums normal  Neck:   Supple, symmetrical, trachea midline, no adenopathy;    thyroid:  no enlargement/tenderness/nodules  Back:     Symmetric, no curvature, ROM normal, no CVA tenderness  Lungs:     Clear to auscultation bilaterally, respirations unlabored  Chest Wall:    No tenderness or deformity   Heart:    Regular rate and rhythm, S1 and S2 normal, no murmur, rub   or gallop  Breast Exam:    No tenderness, masses, or nipple abnormality  Abdomen:     Soft, non-tender, bowel sounds active all four quadrants,    no  masses, no organomegaly  Genitalia:    Normal female without lesion, discharge or tenderness     Extremities:   Extremities normal, atraumatic, no cyanosis or edema  Pulses:   2+ and symmetric all extremities  Skin:   Skin color, texture, turgor normal, no rashes or lesions      Assessment:    Healthy female exam.   Menopausal state Elevated blood pressure on meds    Plan:    Follow up in: 1 year.    F/u year mammograms  Talib Headley L. Harraway-Smith, M.D., [place]

## 2018-08-27 ENCOUNTER — Other Ambulatory Visit: Payer: Self-pay | Admitting: Nurse Practitioner

## 2018-08-27 ENCOUNTER — Ambulatory Visit
Admission: RE | Admit: 2018-08-27 | Discharge: 2018-08-27 | Disposition: A | Discharge status: Home / Self Care | Payer: Medicaid Other | Source: Ambulatory Visit | Attending: Nurse Practitioner | Admitting: Nurse Practitioner

## 2018-08-27 DIAGNOSIS — G8929 Other chronic pain: Secondary | ICD-10-CM

## 2018-08-27 DIAGNOSIS — M545 Low back pain, unspecified: Secondary | ICD-10-CM

## 2018-09-17 ENCOUNTER — Other Ambulatory Visit: Payer: Self-pay

## 2018-09-17 ENCOUNTER — Ambulatory Visit: Payer: Medicaid Other | Attending: Orthopedic Surgery | Admitting: Physical Therapy

## 2018-09-17 ENCOUNTER — Encounter: Payer: Self-pay | Admitting: Physical Therapy

## 2018-09-17 DIAGNOSIS — G8929 Other chronic pain: Secondary | ICD-10-CM | POA: Diagnosis present

## 2018-09-17 DIAGNOSIS — M545 Low back pain: Secondary | ICD-10-CM | POA: Diagnosis not present

## 2018-09-17 NOTE — Therapy (Addendum)
[facility] [address] [place], [place], [address] Phone: [phone]   Fax:  [phone]  Physical Therapy Evaluation/Discharge  Patient Details  Name: Jean Melton MRN: [id number] Date of Birth: 11/27/1960 Referring Provider (PT): Lucey, Steve, MD   Encounter Date: 09/17/2018  PT End of Session - 09/17/18 1053    Visit Number  1    Number of Visits  4    Date for PT Re-Evaluation  10/15/18    Authorization Type  MCD    PT Start Time  0950    PT Stop Time  1030    PT Time Calculation (min)  40 min    Activity Tolerance  Patient tolerated treatment well    Behavior During Therapy  WFL for tasks assessed/performed       Past Medical History:  Diagnosis Date  . Asthma   . Depression   . Diabetes mellitus (HCC)   . GERD (gastroesophageal reflux disease)   . Hyperlipidemia   . Hypertension     Past Surgical History:  Procedure Laterality Date  . ABDOMINAL HYSTERECTOMY    . ABDOMINAL SURGERY    . CESAREAN SECTION    . CHOLECYSTECTOMY    . COLONOSCOPY  02/02/2012   Procedure: COLONOSCOPY;  Surgeon: James L Edwards Jr., MD;  Location: [facility];  Service: Endoscopy;  Laterality: N/A;  . EYE SURGERY    . STOMACH SURGERY      There were no vitals filed for this visit.   Subjective Assessment - 09/17/18 1000    Subjective  She relays she has Chronic low back /slight scoliosis, pain for 3 years, denies radiculopathy. She can not recall any MOI, she does not know what makes it worse or better and pain is intermittent.     Pertinent History  PMH: asthma, dep, DM,GERD,HTN    Limitations  House hold activities;Lifting    How long can you sit comfortably?  not limited    How long can you stand comfortably?  some pain after supermarket    Diagnostic tests  lumbar XR: min curvature lumbar convex Rt, and min L4-5 disc space narrowing    Patient Stated Goals  to learn what exercises to do for my back    Currently in Pain?   Yes    Pain Score  5     Pain Location  Back    Pain Orientation  Left;Mid    Pain Descriptors / Indicators  Aching;Dull    Pain Type  Chronic pain    Pain Radiating Towards  denies    Pain Onset  More than a month ago    Pain Frequency  Intermittent    Aggravating Factors   bending over, but does not know any others    Pain Relieving Factors  voltaren gel and sometimes meds, has not tried ice or heat    Effect of Pain on Daily Activities  limites housework    Multiple Pain Sites  No         [facility] PT Assessment - 09/17/18 0001      Assessment   Medical Diagnosis  Chronic low back /slight scoliosis    Referring Provider (PT)  Lucey, Steve, MD    Onset Date/Surgical Date  --   3 years onset of pain   Hand Dominance  Right    Next MD Visit  not scheduled     Prior Therapy  for knees       Precautions     Precautions  None      Balance Screen   Has the patient fallen in the past 6 months  No      Home Environment   Living Environment  Private residence    Additional Comments  2 steps, no difficulty with this      Prior Function   Level of Independence  Independent      Cognition   Overall Cognitive Status  Within Functional Limits for tasks assessed      Observation/Other Assessments   Focus on Therapeutic Outcomes (FOTO)   not done, MCD      Sensation   Light Touch  Appears Intact      Coordination   Gross Motor Movements are Fluid and Coordinated  Yes      ROM / Strength   AROM / PROM / Strength  AROM;Strength      AROM   AROM Assessment Site  Lumbar    Lumbar Flexion  75%    Lumbar Extension  WNL    Lumbar - Right Side Bend  75%    Lumbar - Left Side Bend  75%    Lumbar - Right Rotation  75%    Lumbar - Left Rotation  75%      Strength   Overall Strength Comments  LE strength 5/5 MMT grossly bilat tested in sitting      Flexibility   Soft Tissue Assessment /Muscle Length  --   mod tightness in lumbar P.S Lt>Rt, mod H.S tightness bilat      Palpation   Spinal mobility  good mobility with central PAM testing, no pain reported    Palpation comment  tightness and mild TTP in Lt lumbar P.S.      Special Tests   Other special tests  +slump test bilat for neural tension, neg quadrant test, neg SLR bilat, no pain with lumbar ROM overpressure, neg FABER and FADIR test,       Transfers   Transfers  Independent with all Transfers      Ambulation/Gait   Gait Comments  WFL, no AD needed                Objective measurements completed on examination: See above findings.      [facility] Adult PT Treatment/Exercise - 09/17/18 0001      Modalities   Modalities  Electrical Stimulation;Moist Heat      Moist Heat Therapy   Number Minutes Moist Heat  15 Minutes    Moist Heat Location  Lumbar Spine      Electrical Stimulation   Electrical Stimulation Location  lumbar    Electrical Stimulation Action  IFC    Electrical Stimulation Parameters  tolerance, pt sitting    Electrical Stimulation Goals  Tone;Pain             PT Education - 09/17/18 1053    Education Details  HEP, POC,TENS    Person(s) Educated  Patient    Methods  Explanation;Demonstration;Verbal cues;Handout    Comprehension  Verbalized understanding;Need further instruction          PT Long Term Goals - 09/17/18 1110      PT LONG TERM GOAL #1   Title  Pt will be I and compliant with HEP. (4 weeks 10/15/18)    Baseline  no HEP until today    Status  New      PT LONG TERM GOAL #2   Title  Pt will increase lumbar ROM to WFL (at   least 80% available) (4 weeks 10/15/18)    Baseline  75% available    Status  New      PT LONG TERM GOAL #3   Title  Pt will report pain overall less than 3/10 with her usual activity and houswork. (4 weeks 10/15/18)    Baseline  5/10 back pain on average    Status  New             Plan - 09/17/18 1054    Clinical Impression Statement  Pt presents with Chronic low back /slight scoliosis, pain for 3 years, and she  denies radiculopathy. Special testing mostly negative except for some slight neural tension. Her pain appears to be more muscular in nature as it is localized today to Lt side and she has incresed tightness in her Left paraspinals. She does have overall decreased lumbar ROM, decreased core strength, increased lumbar tightness and increased pain limiting her full function. She will benefit from skilled PT to address her defecits.     Personal Factors and Comorbidities  Comorbidity 1;Comorbidity 2;Comorbidity 3+    Comorbidities  PMH: asthma, dep, DM,GERD,HTN    Examination-Activity Limitations  Bend;Squat;Lift;Reach Overhead    Examination-Participation Restrictions  Laundry;Cleaning    Stability/Clinical Decision Making  Evolving/Moderate complexity    Clinical Decision Making  Moderate    Rehab Potential  Good    PT Frequency  1x / week    PT Duration  4 weeks    PT Treatment/Interventions  Aquatic Therapy;Cryotherapy;Electrical Stimulation;Iontophoresis 52m/ml Dexamethasone;Moist Heat;Traction;Ultrasound;Therapeutic activities;Therapeutic exercise;Neuromuscular re-education;Patient/family education;Manual techniques;Passive range of motion;Dry needling;Taping;Spinal Manipulations;Joint Manipulations    PT Next Visit Plan  ask response to Estim and MHP after last visit, review HEP, would benefit from MT to left lumbar and consider manipulaitons or mobs, needs lumbar stretching and core     PT Home Exercise Plan  child pose, HSS, sidelying T rotation, rows,ext, hip hinge, bridge    Consulted and Agree with Plan of Care  Patient       Patient will benefit from skilled therapeutic intervention in order to improve the following deficits and impairments:  Decreased activity tolerance, Decreased endurance, Decreased range of motion, Decreased strength, Increased muscle spasms, Increased fascial restricitons, Impaired flexibility, Postural dysfunction, Improper body mechanics, Pain  Visit  Diagnosis: Chronic bilateral low back pain without sciatica     Problem List Patient Active Problem List   Diagnosis Date Noted  . Bipolar 2 disorder ([place] 07/18/2017  . Arthritis 07/18/2017  . Iliotibial band syndrome of right side 05/05/2017  . Patellofemoral pain syndrome of both knees 10/28/2016  . Hematochezia   . Esophageal reflux   . Hypercholesterolemia   . Controlled type 2 diabetes mellitus without complication ([place]   . Acute lower GI bleeding 10/26/2015  . Chronic pain of both ankles 07/29/2015  . Mixed incontinence 10/16/2014  . 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 Lichen planus 114/97/0263 . Rash 03/21/2014  . Left elbow pain 10/08/2013  . Hyperosmolarity due to secondary diabetes ([facility] 09/11/2011  . Diabetes mellitus, new onset ([place] 09/09/2011  . Hyponatremia 09/09/2011  . Hyperkalemia 09/09/2011  . HTN (hypertension) 09/09/2011  . Dehydration 09/09/2011  . GERD (gastroesophageal reflux disease) 09/09/2011  . Asthma 09/09/2011    BDebbe Odea,PT,DPT 09/17/2018, 11:13 AM  [facility] [place] [address]Phone: [phone]  Fax:  [phone] Name: EKWANA RINGELMRN: [id number] of Birth: [birth year]PHYSICAL THERAPY DISCHARGE SUMMARY  Visits from Start of Care: 1  Current functional level related to goals / functional outcomes: See above   Remaining deficits: See above   Education / Equipment: Anatomy of condition, POC, HEP, exercise form/rationale  Plan: Patient agrees to discharge.  Patient goals were not met. Patient is being discharged due to                                                     ?????     Pt does not feel comfortable coming into the clinic at this time due to COVID-19 and will obtain a new referral when she is ready to return.  Jessica C. Hightower PT, DPT 11/06/18 12:54 PM   

## 2018-09-17 NOTE — Patient Instructions (Addendum)
Access Code: [email or web address]  URL: [email or web address]  Date: 09/17/2018  Prepared by: Ivery Quale   Exercises  Child's Pose Stretch - 3 sets - 30 hold - 2x daily - 6x weekly  Child's Pose with Sidebending - 10 reps - 3 sets - 2x daily - 6x weekly  Sidelying Mid Thoracic Rotation - 10 reps - 1 sets - 10 hold - 2x daily - 6x weekly  Single Knee to Chest Stretch - 2 reps - 1 sets - 30 hold - 2x daily - 6x weekly  Supine Hamstring Stretch with Strap - 10 reps - 3 sets - 2x daily - 6x weekly  Supine Bridge - 10 reps - 1-3 sets - 2x daily - 6x weekly  Standing Hip Hinge with Dowel - 10 reps - 1-2 sets - 2x daily - 6x weekly  Standing Row with Resistance - 10 reps - 2-3 sets - 2x daily - 6x weekly  Shoulder extension with resistance - Neutral - 10 reps - 2-3 sets - 2x daily - 6x weekly   TENS UNIT: This is helpful for muscle pain and spasm.   Search and Purchase a TENS 7000 2nd edition at www.tenspros.com. It should be less than $30.     TENS unit instructions: Do not shower or bathe with the unit on Turn the unit off before removing electrodes or batteries If the electrodes lose stickiness add a drop of water to the electrodes after they are disconnected from the unit and place on plastic sheet. If you continued to have difficulty, call the TENS unit company to purchase more electrodes. Do not apply lotion on the skin area prior to use. Make sure the skin is clean and dry as this will help prolong the life of the electrodes. After use, always check skin for unusual red areas, rash or other skin difficulties. If there are any skin problems, does not apply electrodes to the same area. Never remove the electrodes from the unit by pulling the wires. Do not use the TENS unit or electrodes other than as directed. Do not change electrode placement without consultating your therapist or physician. Keep 2 fingers with between each electrode. Wear time ratio is 2:1, on to off times.     For example on for 30 minutes off for 15 minutes and then on for 30 minutes off for 15 minutes

## 2018-09-25 ENCOUNTER — Ambulatory Visit: Payer: Medicaid Other | Admitting: Physical Therapy

## 2018-10-01 ENCOUNTER — Ambulatory Visit: Payer: Medicaid Other | Admitting: Physical Therapy

## 2018-10-09 ENCOUNTER — Ambulatory Visit: Payer: Medicaid Other | Admitting: Physical Therapy

## 2018-10-18 ENCOUNTER — Ambulatory Visit: Payer: Medicaid Other | Admitting: Podiatry

## 2018-10-28 ENCOUNTER — Encounter: Payer: Self-pay | Admitting: Cardiology

## 2018-10-28 DIAGNOSIS — I5042 Chronic combined systolic (congestive) and diastolic (congestive) heart failure: Secondary | ICD-10-CM | POA: Insufficient documentation

## 2018-10-28 NOTE — Progress Notes (Signed)
Virtual Visit via Telephone Note: Patient unable to use video assisted device.  This visit type was conducted due to national recommendations for restrictions regarding the COVID-19 Pandemic (e.g. social distancing).  This format is felt to be most appropriate for this patient at this time.  All issues noted in this document were discussed and addressed.  No physical exam was performed.  The patient has consented to conduct a Telehealth visit and understands insurance will be billed.   I connected with@, on 10/29/18 at  by TELEPHONE and verified that I am speaking with the correct person using two identifiers.   I discussed the limitations of evaluation and management by telemedicine and the availability of in person appointments. The patient expressed understanding and agreed to proceed.   I have discussed with patient regarding the safety during COVID Pandemic and steps and precautions to be taken including social distancing, frequent hand wash and use of detergent soap, gels with the patient. I asked the patient to avoid touching mouth, nose, eyes, ears with the hands. I encouraged regular walking around the neighborhood and exercise and regular diet, as long as social distancing can be maintained.   Primary Physician/Referring:  Berkley Harvey, NP  Patient ID: Jean Melton, female    DOB: Nov 15, 1960, 58 y.o.   MRN: [id number]  Chief Complaint  Patient presents with  . Cardiomyopathy  . Follow-up    27mh   HPI: Jean Melton is a 58y.o. female  with type II diabetes, mixed hyperlipiedmia, and hypertension initally recently [place] on 09[place]18[place]17. Exercise nuclear stress testing on 10/06/2017 was low risk. Echocardiogram on 10/24/2017 revealed decreased EF of 40%, moderate MR, and insignificant pericardial effusion. Syncope was felt to be due to vasovagal episode than cardiogenic.  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 She has not had any recurrence of syncope or dizziness. She is tolerating all her medications well. Except for occasional palpitations and back pain no specific complaints. She has noticed BP to be high. States DM is well controlled.   Past Medical History:  Diagnosis Date  . Asthma   . Depression   . GERD (gastroesophageal reflux disease)   . Hyperosmolarity due to secondary diabetes ([place] 09/11/2011  . Hypertension     Past Surgical History:  Procedure Laterality Date  . ABDOMINAL HYSTERECTOMY    . ABDOMINAL SURGERY    . CESAREAN SECTION    . CHOLECYSTECTOMY    . COLONOSCOPY  02/02/2012   Procedure: COLONOSCOPY;  Surgeon: JWinfield Cunas, MD;  [facility];  Service: Endoscopy;  Laterality: N/A;  . EYE SURGERY    . STOMACH SURGERY      Social History   Socioeconomic History  . Marital status: Single    Spouse name: Not on file  . Number of children: 5  . Years of education: Not on file  . Highest education level: Not on file  Occupational History  . Not on file  Social Needs  . Financial resource strain: Not on file  . Food insecurity:    Worry: Not on file    Inability: Not on file  . Transportation needs:    Medical: Not on file    Non-medical: Not on file  Tobacco Use  . Smoking status: Never Smoker  . Smokeless tobacco: Never Used  Substance and Sexual Activity  . Alcohol use: Never  Frequency: Never  . Drug use: No  . Sexual activity: Yes    Birth control/protection: Surgical  Lifestyle  . Physical activity:    Days per week: Not on file    Minutes per session: Not on file  . Stress: Not on file  Relationships  . Social connections:    Talks on phone: Not on file    Gets together: Not on file    Attends religious service: Not on file    Active member of club or organization: Not on file    Attends meetings of clubs or organizations: Not on file    Relationship status:  Not on file  . Intimate partner violence:    Fear of current or ex partner: Not on file    Emotionally abused: Not on file    Physically abused: Not on file    Forced sexual activity: Not on file  Other Topics Concern  . Not on file  Social History Narrative  . Not on file    Current Outpatient Medications on File Prior to Visit  Medication Sig Dispense Refill  . albuterol (PROVENTIL HFA) 108 (90 BASE) MCG/ACT inhaler Inhale 2 puffs into the lungs every 4 (four) hours as needed. Cough/wheezing    . aspirin 81 MG tablet Take 81 mg by mouth daily.    Marland Kitchen atenolol (TENORMIN) 50 MG tablet Take by mouth.    . cetirizine (ZYRTEC) 10 MG tablet Take 10 mg by mouth daily as needed for allergies.     . citalopram (CELEXA) 20 MG tablet Take by mouth.    . cycloSPORINE (RESTASIS) 0.05 % ophthalmic emulsion Place 1 drop into both eyes 2 (two) times daily.    Marland Kitchen desvenlafaxine (PRISTIQ) 100 MG 24 hr tablet Take 1 tablet by mouth daily.    . diclofenac sodium (VOLTAREN) 1 % GEL Apply 2-4 g topically 4 (four) times daily as needed (for pain).     . Ferrous Sulfate (IRON) 325 (65 Fe) MG TABS Take by mouth.    . fluticasone (FLONASE) 50 MCG/ACT nasal spray Place into the nose.    Marland Kitchen Fluticasone-Salmeterol (ADVAIR DISKUS) 250-50 MCG/DOSE AEPB Inhale 1 puff into the lungs 2 (two) times daily.    Marland Kitchen glucose blood test strip USE TO CHECK BLOOD SUGAR TWO TIMES DAILY    . glucose blood test strip USE TO CHECK BLOOD SUGAR TWO TIMES DAILY    . glucose blood test strip 1 each by Misc.(Non-Drug; Combo Route) route daily.    . Lancets (ACCU-CHEK MULTICLIX) lancets 1 each by Misc.(Non-Drug; Combo Route) route daily.    Marland Kitchen omeprazole (PRILOSEC) 20 MG capsule Take 2 capsules (40 mg total) by mouth daily. (Patient taking differently: Take 20 mg by mouth daily. ) 60 capsule 1  . rosuvastatin (CRESTOR) 10 MG tablet Take by mouth.    . sitaGLIPtin-metformin (JANUMET) 50-500 MG tablet Take 1 tablet by mouth 2 (two) times daily  with a meal.    . solifenacin (VESICARE) 5 MG tablet Take by mouth.    . [DISCONTINUED] buPROPion (WELLBUTRIN XL) 150 MG 24 hr tablet Take 150 mg by mouth daily.       No current facility-administered medications on file prior to visit.     Review of Systems  Constitution: Negative for chills, decreased appetite, malaise/fatigue and weight gain.  Cardiovascular: Positive for palpitations (occasional). Negative for dyspnea on exertion, leg swelling and syncope.  Endocrine: Negative for cold intolerance.  Hematologic/Lymphatic: Does not bruise/bleed easily.  Musculoskeletal: Positive  for back pain (scoliosis). Negative for joint swelling.  Gastrointestinal: Negative for abdominal pain, anorexia and change in bowel habit.  Neurological: Negative for headaches and light-headedness.  Psychiatric/Behavioral: Negative for depression and substance abuse.  All other systems reviewed and are negative.     Objective  Blood pressure (!) 152/103, pulse 93, height 5' 6"  (1.676 m), weight 173 lb (78.5 kg). Body mass index is 27.92 kg/m. Physical exam not performed or limited due to virtual visit. Please see exam details from prior visit is as below.     Physical Exam  Constitutional: She appears well-developed and well-nourished. No distress.  HENT:  Head: Atraumatic.  Eyes: Conjunctivae are normal.  Neck: Neck supple. No JVD present. No thyromegaly present.  Cardiovascular: Normal rate, regular rhythm, normal heart sounds and intact distal pulses. Exam reveals no gallop.  No murmur heard. Pulmonary/Chest: Effort normal and breath sounds normal.  Abdominal: Soft. Bowel sounds are normal.  Musculoskeletal: Normal range of motion.        General: No edema.  Neurological: She is alert.  Skin: Skin is warm and dry.  Psychiatric: She has a normal mood and affect.   Radiology: No results found.  Laboratory examination:   Labs 08/20/2018: A1c 6.4%. Potassium 4.1, BUN 17, creatinine 0.74, eGFR  greater than 60 mL.  CMP otherwise normal. 10/03/2016: Total cholesterol 170, triglycerides 76, HDL 61, LDL 94.  Cardiac Studies:   Exercise myoview stress 10/06/2017:  1. The patient performed treadmill exercise using a Bruce protocol, completing 6 minutes. The patient completed an estimated workload of 7.05 METS, reaching 99% of the maximum predicted heart rate. Normal hemodynamic response. Fair exercise capacity. No stress symptoms reported. No ischemic changes seen on stress electrocardiogram. 2. The overall quality of the study is excellent. There is no evidence of abnormal lung activity. Stress and rest SPECT images demonstrate homogeneous tracer distribution throughout the myocardium. Gated SPECT imaging reveals normal myocardial thickening and wall motion. The left ventricular ejection fraction was normal (50%).   3. Low risk study.  Echocardiogram 10/24/2017: Left ventricle cavity is normal in size. Mild concentric hypertrophy of the left ventricle. Mild decrease in global wall motion. Visual EF is 45-50%. Doppler evidence of grade I (impaired) diastolic dysfunction, normal LAP. Calculated EF 40%. Left atrial cavity is mildly dilated. Moderate (Grade II) mitral regurgitation. Mild to moderate tricuspid regurgitation. Estimated pulmonary artery systolic pressure 25  mmHg. Insignificant pericardial effusion.  Holter Monitor  24 hours 11/09/2017: No symptoms reported. Predominant rhythm is sinus tachycardia. Patient had 18 hours of sinus tachycardia, maximum heart rate 152 bpm. Average heart rate 106 bpm. Occasional PVCs.  Assessment   Non-ischemic cardiomyopathy ([place])  Chronic combined systolic and diastolic heart failure ([place]) - Plan: losartan-hydrochlorothiazide (HYZAAR) 100-25 MG tablet  Hypercholesterolemia  Essential hypertension - Plan: losartan-hydrochlorothiazide (HYZAAR) 100-25 MG tablet  EKG 04/26/2018: Normal sinus rhythm at rate of 78 bpm, normal axis.  Poor R-wave  progression, probably normal variant.  Normal EKG.  Recommendations:   Patient is here on a six-month visit for follow-up of nonischemic cardio myopathy and chronic systolic and diastolic heart failure.  Blood pressure is markedly elevated today, I will switch from losartan 50 mg daily to losartan HCT 100/25 mg every morning. I reviewed her labs, I do not see lipid profile testing in the past greater than 2 years.  She'll obtain CMP and lipid profile testing with her PCP which she prefers.  Advised her that I will be able to access her medical records.  I'll repeat echocardiogram to reevaluate her LV systolic function.  I suspect hypertensive heart disease to be the etiology for cardiomyopathy.  There is no family history of sudden cardiac death or cardiomyopathy.  I'd like to see her back in 6 weeks for follow-up.  Diabetes is well controlled.  This is a telephone only encounter/Virtual visit, patient does not have video-assisted capability.  Adrian Prows, MD, [facility] 10/29/2018, 9:18 AM [facility]. [place] Pager: [phone] Office: [phone] If no answer Cell [phone]

## 2018-10-29 ENCOUNTER — Ambulatory Visit: Payer: Medicaid Other | Admitting: Cardiology

## 2018-10-29 ENCOUNTER — Other Ambulatory Visit: Payer: Self-pay

## 2018-10-29 ENCOUNTER — Encounter: Payer: Self-pay | Admitting: Cardiology

## 2018-10-29 VITALS — BP 152/103 | HR 93 | Ht 66.0 in | Wt 173.0 lb

## 2018-10-29 DIAGNOSIS — I5042 Chronic combined systolic (congestive) and diastolic (congestive) heart failure: Secondary | ICD-10-CM

## 2018-10-29 DIAGNOSIS — E7800 Pure hypercholesterolemia, unspecified: Secondary | ICD-10-CM | POA: Diagnosis not present

## 2018-10-29 DIAGNOSIS — I428 Other cardiomyopathies: Secondary | ICD-10-CM

## 2018-10-29 DIAGNOSIS — I10 Essential (primary) hypertension: Secondary | ICD-10-CM | POA: Diagnosis not present

## 2018-10-29 MED ORDER — LOSARTAN POTASSIUM-HCTZ 100-25 MG PO TABS: 1.0000 | ORAL_TABLET | Freq: Every day | ORAL | 1 refills | Status: DC

## 2018-11-06 ENCOUNTER — Telehealth: Payer: Self-pay | Admitting: Physical Therapy

## 2018-11-06 NOTE — Telephone Encounter (Signed)
Spoke with pt- does not feel comfortable returning to in-clinic visits at this time and will obtain a new referral when she is ready to return.  Jean Melton C. Anayia Eugene PT, DPT 11/06/18 12:56 PM

## 2018-11-15 ENCOUNTER — Other Ambulatory Visit: Payer: Self-pay

## 2018-11-15 ENCOUNTER — Ambulatory Visit (INDEPENDENT_AMBULATORY_CARE_PROVIDER_SITE_OTHER): Payer: Medicaid Other

## 2018-11-15 DIAGNOSIS — I5042 Chronic combined systolic (congestive) and diastolic (congestive) heart failure: Secondary | ICD-10-CM

## 2018-12-07 ENCOUNTER — Other Ambulatory Visit: Payer: Self-pay

## 2018-12-07 ENCOUNTER — Encounter: Payer: Self-pay | Admitting: Cardiology

## 2018-12-07 ENCOUNTER — Ambulatory Visit (INDEPENDENT_AMBULATORY_CARE_PROVIDER_SITE_OTHER): Payer: Medicaid Other | Admitting: Cardiology

## 2018-12-07 VITALS — BP 145/89 | HR 83 | Ht 66.0 in | Wt 173.0 lb

## 2018-12-07 DIAGNOSIS — I5032 Chronic diastolic (congestive) heart failure: Secondary | ICD-10-CM

## 2018-12-07 DIAGNOSIS — I110 Hypertensive heart disease with heart failure: Secondary | ICD-10-CM

## 2018-12-07 DIAGNOSIS — I428 Other cardiomyopathies: Secondary | ICD-10-CM | POA: Diagnosis not present

## 2018-12-07 DIAGNOSIS — I10 Essential (primary) hypertension: Secondary | ICD-10-CM | POA: Diagnosis not present

## 2018-12-07 MED ORDER — ATENOLOL 100 MG PO TABS: 100.0000 mg | ORAL_TABLET | Freq: Two times a day (BID) | ORAL | 3 refills | Status: DC

## 2018-12-07 NOTE — Progress Notes (Signed)
Primary Physician/Referring:  Berkley Harvey, NP  Patient ID: Jean Melton, female    DOB: Jan 17, 1961, 58 y.o.   MRN: [id number]  Chief Complaint  Patient presents with  . Hypertension  . Cardiomyopathy  . Follow-up   HPI: Jean Melton  is a 58 y.o. female  with type II diabetes, mixed hyperlipiedmia, and hypertension and had a synopal episode on 09/18/17.Syncope was felt to be due to vasovagal episode than cardiogenic.  Nuclear stress test at that time had revealed no evidence of ischemia and EF is around 40-45% by echocardiogram.  She has nonischemic cardiomyopathy probably hypertensive heart disease.  Patient presents here for follow-up and evaluation of nonischemic cardiomyopathy with chronic systolic and diastolic heart failure. She has not had any recurrence of syncope or dizziness. She is tolerating all her medications well, However states that with activity feels like the heart races and would like to change her heart medications as she gets scared doing physical activity.  No significant change in weight, no leg edema.  DM is well controlled  With A1c of 6.5% in the 1st week of May 2020.   Past Medical History:  Diagnosis Date  . Asthma   . Depression   . GERD (gastroesophageal reflux disease)   . Hyperosmolarity due to secondary diabetes ([place]) 09/11/2011  . Hypertension     Past Surgical History:  Procedure Laterality Date  . ABDOMINAL HYSTERECTOMY    . ABDOMINAL SURGERY    . CESAREAN SECTION    . CHOLECYSTECTOMY    . COLONOSCOPY  02/02/2012   Procedure: COLONOSCOPY;  Surgeon: Winfield Cunas., MD;  [facility];  Service: Endoscopy;  Laterality: N/A;  . EYE SURGERY    . STOMACH SURGERY      Social History   Socioeconomic History  . Marital status: Single    Spouse name: Not on file  . Number of children: 5  . Years of education: Not on file  . Highest education level: Not on file  Occupational History  . Not on file  Social Needs  .  Financial resource strain: Not on file  . Food insecurity:    Worry: Not on file    Inability: Not on file  . Transportation needs:    Medical: Not on file    Non-medical: Not on file  Tobacco Use  . Smoking status: Never Smoker  . Smokeless tobacco: Never Used  Substance and Sexual Activity  . Alcohol use: Yes    Frequency: Never  . Drug use: No  . Sexual activity: Yes    Birth control/protection: Surgical  Lifestyle  . Physical activity:    Days per week: Not on file    Minutes per session: Not on file  . Stress: Not on file  Relationships  . Social connections:    Talks on phone: Not on file    Gets together: Not on file    Attends religious service: Not on file    Active member of club or organization: Not on file    Attends meetings of clubs or organizations: Not on file    Relationship status: Not on file  . Intimate partner violence:    Fear of current or ex partner: Not on file    Emotionally abused: Not on file    Physically abused: Not on file    Forced sexual activity: Not on file  Other Topics Concern  . Not on file  Social History Narrative  .  Not on file    Current Outpatient Medications on File Prior to Visit  Medication Sig Dispense Refill  . albuterol (PROVENTIL HFA) 108 (90 BASE) MCG/ACT inhaler Inhale 2 puffs into the lungs every 4 (four) hours as needed. Cough/wheezing    . aspirin 81 MG tablet Take 81 mg by mouth daily.    Marland Kitchen atenolol (TENORMIN) 50 MG tablet Take by mouth 2 (two) times daily.     . cetirizine (ZYRTEC) 10 MG tablet Take 10 mg by mouth daily as needed for allergies.     . cycloSPORINE (RESTASIS) 0.05 % ophthalmic emulsion Place 1 drop into both eyes 2 (two) times daily.    Marland Kitchen desvenlafaxine (PRISTIQ) 100 MG 24 hr tablet Take 1 tablet by mouth daily.    . diclofenac sodium (VOLTAREN) 1 % GEL Apply 2-4 g topically 4 (four) times daily as needed (for pain).     . Ferrous Sulfate (IRON) 325 (65 Fe) MG TABS Take by mouth.    . fluticasone  (FLONASE) 50 MCG/ACT nasal spray Place into the nose.    Marland Kitchen Fluticasone-Salmeterol (ADVAIR DISKUS) 250-50 MCG/DOSE AEPB Inhale 1 puff into the lungs 2 (two) times daily.    Marland Kitchen LORazepam (ATIVAN) 0.5 MG tablet Take 0.5 mg by mouth every 6 (six) hours as needed for anxiety.    Marland Kitchen losartan-hydrochlorothiazide (HYZAAR) 100-25 MG tablet Take 1 tablet by mouth daily. 30 tablet 1  . Multiple Vitamin (MULTIVITAMIN) capsule Take 1 capsule by mouth daily.    Marland Kitchen omeprazole (PRILOSEC) 20 MG capsule Take 2 capsules (40 mg total) by mouth daily. (Patient taking differently: Take 20 mg by mouth daily. ) 60 capsule 1  . rosuvastatin (CRESTOR) 10 MG tablet Take by mouth.    . sitaGLIPtin-metformin (JANUMET) 50-1000 MG tablet Take 1 tablet by mouth 2 (two) times daily with a meal.     . solifenacin (VESICARE) 5 MG tablet Take 5 mg by mouth daily.    Marland Kitchen glucose blood test strip USE TO CHECK BLOOD SUGAR TWO TIMES DAILY    . glucose blood test strip USE TO CHECK BLOOD SUGAR TWO TIMES DAILY    . glucose blood test strip 1 each by Misc.(Non-Drug; Combo Route) route daily.    . Lancets (ACCU-CHEK MULTICLIX) lancets 1 each by Misc.(Non-Drug; Combo Route) route daily.    . [DISCONTINUED] buPROPion (WELLBUTRIN XL) 150 MG 24 hr tablet Take 150 mg by mouth daily.       No current facility-administered medications on file prior to visit.    Review of Systems  Constitution: Negative for chills, decreased appetite, malaise/fatigue and weight gain.  Cardiovascular: Positive for palpitations (occasional). Negative for dyspnea on exertion, leg swelling and syncope.  Endocrine: Negative for cold intolerance.  Hematologic/Lymphatic: Does not bruise/bleed easily.  Musculoskeletal: Positive for back pain (scoliosis). Negative for joint swelling.  Gastrointestinal: Negative for abdominal pain, anorexia and change in bowel habit.  Neurological: Negative for headaches and light-headedness.  Psychiatric/Behavioral: Negative for depression  and substance abuse.  All other systems reviewed and are negative.     Objective  Blood pressure (!) 145/89, pulse 83, height 5' 6"  (1.676 m), weight 173 lb (78.5 kg), SpO2 97 %. Body mass index is 27.92 kg/m.     Physical Exam  Constitutional: She appears well-developed and well-nourished. No distress.  HENT:  Head: Atraumatic.  Eyes: Conjunctivae are normal.  Neck: Neck supple. No JVD present. No thyromegaly present.  Cardiovascular: Normal rate, regular rhythm, normal heart sounds and intact distal  pulses. Exam reveals no gallop.  No murmur heard. Pulmonary/Chest: Effort normal and breath sounds normal.  Abdominal: Soft. Bowel sounds are normal.  Musculoskeletal: Normal range of motion.        General: No edema.  Neurological: She is alert.  Skin: Skin is warm and dry.  Psychiatric: She has a normal mood and affect.   Radiology: No results found.  Laboratory examination:   Labs 08/20/2018: A1c 6.4%. Potassium 4.1, BUN 17, creatinine 0.74, eGFR greater than 60 mL.  CMP otherwise normal. 10/03/2016: Total cholesterol 170, triglycerides 76, HDL 61, LDL 94.  Cardiac Studies:   Exercise myoview stress 10/06/2017:  1. The patient performed treadmill exercise using a Bruce protocol, completing 6 minutes. The patient completed an estimated workload of 7.05 METS, reaching 99% of the maximum predicted heart rate. Normal hemodynamic response. Fair exercise capacity. No stress symptoms reported. No ischemic changes seen on stress electrocardiogram. 2. The overall quality of the study is excellent. There is no evidence of abnormal lung activity. Stress and rest SPECT images demonstrate homogeneous tracer distribution throughout the myocardium. Gated SPECT imaging reveals normal myocardial thickening and wall motion. The left ventricular ejection fraction was normal (50%).   3. Low risk study.  Echocardiogram 11/15/2018: Mildly depressed LV systolic function with EF 40-45%. Left  ventricle cavity is normal in size. Mild concentric hypertrophy of the left ventricle. Normal global wall motion. Doppler evidence of grade I (impaired) diastolic dysfunction, normal LAP.  Left atrial cavity is mildly dilated. 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 Moderate (Grade II) mitral regurgitation. Mild tricuspid regurgitation.  No evidence of pulmonary hypertension. No significant change compared to previous study dated 05/15/2018.  Holter Monitor  24 hours 11/09/2017: No symptoms reported. Predominant rhythm is sinus tachycardia. Patient had 18 hours of sinus tachycardia, maximum heart rate 152 bpm. Average heart rate 106 bpm. Occasional PVCs.  Assessment   No diagnosis found.  EKG 04/26/2018: Normal sinus rhythm at rate of 78 bpm, normal axis.  Poor R-wave progression, probably normal variant.  Normal EKG.  Recommendations:   There is no family history of sudden cardiac death or cardiomyopathy.   Patient is presently doing well, EF has marginally improved from 40% to 09-38%, systolic heart failure no longer present, patient is chronic diastolic heart failure probably related to hypertensive heart disease.  In view of low LVEF and also hypertension and palpitations, we'll increase atenolol from 50 mg to 100 mg p.o. b.i.d.  She has been very sensitive to other medications, has tolerated atenolol well.  Her blood pressure has improved significantly now since being on present medications.  She is also motivated to making lifestyle changes.  I'll see her back in 3 months or sooner if problems.  Diabetes is well controlled.  Adrian Prows, MD, [facility] 12/07/2018, 11:32 AM [facility]. [place] Pager: [phone] Office: [phone] If no answer Cell [phone]

## 2018-12-10 ENCOUNTER — Ambulatory Visit: Payer: Medicaid Other | Admitting: Cardiology

## 2018-12-14 ENCOUNTER — Ambulatory Visit: Payer: Medicaid Other | Admitting: Cardiology

## 2018-12-23 ENCOUNTER — Other Ambulatory Visit: Payer: Self-pay | Admitting: Cardiology

## 2018-12-23 DIAGNOSIS — I10 Essential (primary) hypertension: Secondary | ICD-10-CM

## 2018-12-23 DIAGNOSIS — I5042 Chronic combined systolic (congestive) and diastolic (congestive) heart failure: Secondary | ICD-10-CM

## 2019-01-25 ENCOUNTER — Other Ambulatory Visit: Payer: Self-pay | Admitting: Cardiology

## 2019-01-25 DIAGNOSIS — I5042 Chronic combined systolic (congestive) and diastolic (congestive) heart failure: Secondary | ICD-10-CM

## 2019-01-25 DIAGNOSIS — I10 Essential (primary) hypertension: Secondary | ICD-10-CM

## 2019-03-14 ENCOUNTER — Encounter: Payer: Self-pay | Admitting: Cardiology

## 2019-03-14 ENCOUNTER — Other Ambulatory Visit: Payer: Self-pay

## 2019-03-14 ENCOUNTER — Telehealth: Payer: Medicaid Other | Admitting: Cardiology

## 2019-03-14 VITALS — BP 129/86 | HR 88 | Ht 66.0 in | Wt 173.0 lb

## 2019-03-14 DIAGNOSIS — I110 Hypertensive heart disease with heart failure: Secondary | ICD-10-CM | POA: Diagnosis not present

## 2019-03-14 DIAGNOSIS — I428 Other cardiomyopathies: Secondary | ICD-10-CM | POA: Diagnosis not present

## 2019-03-14 DIAGNOSIS — I5032 Chronic diastolic (congestive) heart failure: Secondary | ICD-10-CM | POA: Diagnosis not present

## 2019-03-14 NOTE — Progress Notes (Signed)
Virtual Visit via Telephone Note: Patient unable to use video assisted device.  This visit type was conducted due to national recommendations for restrictions regarding the COVID-19 Pandemic (e.g. social distancing).  This format is felt to be most appropriate for this patient at this time.  All issues noted in this document were discussed and addressed.  No physical exam was performed.  The patient has consented to conduct a Telehealth visit and understands insurance will be billed.   I connected with@, on 03/15/19 at  by TELEPHONE and verified that I am speaking with the correct person using two identifiers.   I discussed the limitations of evaluation and management by telemedicine and the availability of in person appointments. The patient expressed understanding and agreed to proceed.   I have discussed with patient regarding the safety during COVID Pandemic and steps and precautions to be taken including social distancing, frequent hand wash and use of detergent soap, gels with the patient. I asked the patient to avoid touching mouth, nose, eyes, ears with the hands. I encouraged regular walking around the neighborhood and exercise and regular diet, as long as social distancing can be maintained.  Primary Physician/Referring:  Berkley Harvey, NP  Patient ID: Jean Melton, female    DOB: [birth year], 58 y.o.   MRN: [id number]  Chief Complaint  Patient presents with  . Cardiomyopathy  . Follow-up   HPI: KITIARA HINTZE  is a 58 y.o. AA  female  with type II diabetes, mixed hyperlipiedmia, and hypertension and had a synopal episode on 09/18/17.Syncope was felt to be due to vasovagal episode than cardiogenic.  Nuclear stress test in April 2019 had revealed no evidence of ischemia and EF around 40% by echocardiogram.  She has nonischemic cardiomyopathy probably hypertensive heart disease.  Patient presents here for follow-up and evaluation of nonischemic cardiomyopathy with chronic diastolic  heart failure. She has not had any recurrence of syncope or dizziness. She is tolerating all her medications well, on her last office visit 3 months ago, ate due to palpitations.  She has multiple medication intolerances, but has been  Tolerating atenolol. She underwent echocardiogram.  Past Medical History:  Diagnosis Date  . Asthma   . Depression   . GERD (gastroesophageal reflux disease)   . Hyperosmolarity due to secondary diabetes ([place]) 09/11/2011  . Hypertension     Past Surgical History:  Procedure Laterality Date  . ABDOMINAL HYSTERECTOMY    . ABDOMINAL SURGERY    . CESAREAN SECTION    . CHOLECYSTECTOMY    . COLONOSCOPY  02/02/2012   Procedure: COLONOSCOPY;  Surgeon: Winfield Cunas., MD;  Location: [facility];  Service: Endoscopy;  Laterality: N/A;  . EYE SURGERY    . STOMACH SURGERY      Social History   Socioeconomic History  . Marital status: Single    Spouse name: Not on file  . Number of children: 5  . Years of education: Not on file  . Highest education level: Not on file  Occupational History  . Not on file  Social Needs  . Financial resource strain: Not on file  . Food insecurity    Worry: Not on file    Inability: Not on file  . Transportation needs    Medical: Not on file    Non-medical: Not on file  Tobacco Use  . Smoking status: Never Smoker  . Smokeless tobacco: Never Used  Substance and Sexual Activity  . Alcohol use: Yes  Frequency: Never  . Drug use: No  . Sexual activity: Yes    Birth control/protection: Surgical  Lifestyle  . Physical activity    Days per week: Not on file    Minutes per session: Not on file  . Stress: Not on file  Relationships  . Social Herbalist on phone: Not on file    Gets together: Not on file    Attends religious service: Not on file    Active member of club or organization: Not on file    Attends meetings of clubs or organizations: Not on file    Relationship status: Not on file  .  Intimate partner violence    Fear of current or ex partner: Not on file    Emotionally abused: Not on file    Physically abused: Not on file    Forced sexual activity: Not on file  Other Topics Concern  . Not on file  Social History Narrative  . Not on file    Current Outpatient Medications on File Prior to Visit  Medication Sig Dispense Refill  . albuterol (PROVENTIL HFA) 108 (90 BASE) MCG/ACT inhaler Inhale 2 puffs into the lungs every 4 (four) hours as needed. Cough/wheezing    . aspirin 81 MG tablet Take 81 mg by mouth daily.    Marland Kitchen atenolol (TENORMIN) 100 MG tablet Take 1 tablet (100 mg total) by mouth 2 (two) times daily. 180 tablet 3  . cetirizine (ZYRTEC) 10 MG tablet Take 10 mg by mouth daily as needed for allergies.     . cycloSPORINE (RESTASIS) 0.05 % ophthalmic emulsion Place 1 drop into both eyes 2 (two) times daily.    . diclofenac sodium (VOLTAREN) 1 % GEL Apply 2-4 g topically 4 (four) times daily as needed (for pain).     . Ferrous Sulfate (IRON) 325 (65 Fe) MG TABS Take by mouth.    . fluticasone (FLONASE) 50 MCG/ACT nasal spray Place into the nose.    Marland Kitchen Fluticasone-Salmeterol (ADVAIR DISKUS) 250-50 MCG/DOSE AEPB Inhale 1 puff into the lungs 2 (two) times daily.    Marland Kitchen glucose blood test strip USE TO CHECK BLOOD SUGAR TWO TIMES DAILY    . glucose blood test strip USE TO CHECK BLOOD SUGAR TWO TIMES DAILY    . glucose blood test strip 1 each by Misc.(Non-Drug; Combo Route) route daily.    . Lancets (ACCU-CHEK MULTICLIX) lancets 1 each by Misc.(Non-Drug; Combo Route) route daily.    Marland Kitchen LORazepam (ATIVAN) 0.5 MG tablet Take 0.5 mg by mouth every 6 (six) hours as needed for anxiety.    Marland Kitchen losartan-hydrochlorothiazide (HYZAAR) 100-25 MG tablet Take 1 tablet by mouth once daily 90 tablet 0  . Multiple Vitamin (MULTIVITAMIN) capsule Take 1 capsule by mouth daily.    Marland Kitchen omeprazole (PRILOSEC) 20 MG capsule Take 2 capsules (40 mg total) by mouth daily. (Patient taking differently: Take  20 mg by mouth daily. ) 60 capsule 1  . rosuvastatin (CRESTOR) 10 MG tablet Take by mouth.    . sertraline (ZOLOFT) 25 MG tablet Take 25 mg by mouth 2 (two) times daily.    . sitaGLIPtin-metformin (JANUMET) 50-1000 MG tablet Take 1 tablet by mouth 2 (two) times daily with a meal.     . solifenacin (VESICARE) 5 MG tablet Take 5 mg by mouth daily.    . [DISCONTINUED] buPROPion (WELLBUTRIN XL) 150 MG 24 hr tablet Take 150 mg by mouth daily.       No  current facility-administered medications on file prior to visit.    Review of Systems  Constitution: Negative for chills, decreased appetite, malaise/fatigue and weight gain.  Cardiovascular: Positive for palpitations (occasional). Negative for dyspnea on exertion, leg swelling and syncope.  Endocrine: Negative for cold intolerance.  Hematologic/Lymphatic: Does not bruise/bleed easily.  Musculoskeletal: Positive for back pain (scoliosis). Negative for joint swelling.  Gastrointestinal: Negative for abdominal pain, anorexia and change in bowel habit.  Neurological: Negative for headaches and light-headedness.  Psychiatric/Behavioral: Negative for depression and substance abuse.  All other systems reviewed and are negative.  Objective  Blood pressure 129/86, pulse 88, height 5' 6"  (1.676 m), weight 173 lb (78.5 kg). Body mass index is 27.92 kg/m.    Physical exam not performed or limited due to virtual visit.   Please see exam details from prior visit is as below.  Physical Exam  Constitutional: She appears well-developed and well-nourished. No distress.  HENT:  Head: Atraumatic.  Eyes: Conjunctivae are normal.  Neck: Neck supple. No JVD present. No thyromegaly present.  Cardiovascular: Normal rate, regular rhythm, normal heart sounds and intact distal pulses. Exam reveals no gallop.  No murmur heard. Pulmonary/Chest: Effort normal and breath sounds normal.  Abdominal: Soft. Bowel sounds are normal.  Musculoskeletal: Normal range of motion.         General: No edema.  Neurological: She is alert.  Skin: Skin is warm and dry.  Psychiatric: She has a normal mood and affect.   Radiology: No results found.  Laboratory examination:   Labs 08/20/2018: A1c 6.4%. Potassium 4.1, BUN 17, creatinine 0.74, eGFR greater than 60 mL.  CMP otherwise normal. 10/03/2016: Total cholesterol 170, triglycerides 76, HDL 61, LDL 94.  Cardiac Studies:   Exercise myoview stress 10/06/2017:  1. The patient performed treadmill exercise using a Bruce protocol, completing 6 minutes. The patient completed an estimated workload of 7.05 METS, reaching 99% of the maximum predicted heart rate. Normal hemodynamic response. Fair exercise capacity. No stress symptoms reported. No ischemic changes seen on stress electrocardiogram. 2. The overall quality of the study is excellent. There is no evidence of abnormal lung activity. Stress and rest SPECT images demonstrate homogeneous tracer distribution throughout the myocardium. Gated SPECT imaging reveals normal myocardial thickening and wall motion. The left ventricular ejection fraction was normal (50%).   3. Low risk study.  Holter Monitor  24 hours 11/09/2017: No symptoms reported. Predominant rhythm is sinus tachycardia. Patient had 18 hours of sinus tachycardia, maximum heart rate 152 bpm. Average heart rate 106 bpm. Occasional PVCs.  Echocardiogram 11/15/2018: Mildly depressed LV systolic function with EF 40-45%. Left ventricle cavity is normal in size. Mild concentric hypertrophy of the left ventricle. Normal global wall motion. Doppler evidence of grade I (impaired) diastolic dysfunction, normal LAP.  Left atrial cavity is mildly dilated. 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 Moderate (Grade II) mitral regurgitation. Mild tricuspid regurgitation.  No evidence of pulmonary hypertension. No significant change compared to previous study dated 05/15/2018.  Assessment   Non-ischemic  cardiomyopathy ([place]) - Plan: PCV ECHOCARDIOGRAM COMPLETE  Chronic diastolic heart failure ([place]) - Plan: PCV ECHOCARDIOGRAM COMPLETE  Hypertensive heart disease with heart failure ([place])  EKG 04/26/2018: Normal sinus rhythm at rate of 78 bpm, normal axis.  Poor R-wave progression, probably normal variant.  Normal EKG.  Recommendations:   Patient is presently doing well, EF has marginally improved from 40% to 32-95%, systolic heart failure no longer present, patient is chronic diastolic heart failure probably related to hypertensive  heart disease.  She has been very sensitive to other medications, has tolerated atenolol well. Her blood pressure has improved significantly now since being on Atenolol along with improvement in palpitations.  She is also motivated to making lifestyle changes. DM is well controlled  With A1c of 6.5% in the 1st week of May 2020.   I'll see her back in 1 year with repeat echo.    Adrian Prows, MD, [facility] 03/16/2019, 8:23 AM [facility]. [place] Pager: [phone] Office: [phone] If no answer Cell [phone]

## 2019-03-16 ENCOUNTER — Encounter: Payer: Self-pay | Admitting: Cardiology

## 2019-03-20 ENCOUNTER — Encounter: Payer: Self-pay | Admitting: Cardiology

## 2019-03-20 ENCOUNTER — Telehealth: Payer: Medicaid Other | Admitting: Cardiology

## 2019-03-20 NOTE — Progress Notes (Signed)
I called the patient today on the telephone and after discussions, she remembered that she did in fact speak to me 3 days ago.  To recap this is what I have advised her.  LVEF is low normal at 45% and stable.  Blood pressure and diabetes is well controlled.  Advised her that she weighs 173 pounds and to get the weight down to 160 pounds if possible.  30 minutes of daily exercise weather continuous or broken down into 15-minute intervals is left to her.  I will repeat echocardiogram in 1 year for follow-up of cardiomyopathy and diastolic heart failure and see her back at that time.  She will contact me if she has any new symptoms including dyspnea, leg edema, palpitations or chest pain.  Patient was very appreciative of my call.

## 2019-03-23 ENCOUNTER — Other Ambulatory Visit: Payer: Self-pay | Admitting: Cardiology

## 2019-03-23 DIAGNOSIS — I10 Essential (primary) hypertension: Secondary | ICD-10-CM

## 2019-03-23 DIAGNOSIS — I5042 Chronic combined systolic (congestive) and diastolic (congestive) heart failure: Secondary | ICD-10-CM

## 2019-03-25 ENCOUNTER — Emergency Department (HOSPITAL_COMMUNITY)
Admission: EM | Admit: 2019-03-25 | Discharge: 2019-03-26 | Disposition: A | Discharge status: Home / Self Care | Payer: Medicaid Other | Attending: Emergency Medicine | Admitting: Emergency Medicine

## 2019-03-25 DIAGNOSIS — E119 Type 2 diabetes mellitus without complications: Secondary | ICD-10-CM | POA: Insufficient documentation

## 2019-03-25 DIAGNOSIS — J45909 Unspecified asthma, uncomplicated: Secondary | ICD-10-CM | POA: Insufficient documentation

## 2019-03-25 DIAGNOSIS — Z7982 Long term (current) use of aspirin: Secondary | ICD-10-CM | POA: Diagnosis not present

## 2019-03-25 DIAGNOSIS — Z79899 Other long term (current) drug therapy: Secondary | ICD-10-CM | POA: Diagnosis not present

## 2019-03-25 DIAGNOSIS — I10 Essential (primary) hypertension: Secondary | ICD-10-CM | POA: Insufficient documentation

## 2019-03-25 DIAGNOSIS — Z7984 Long term (current) use of oral hypoglycemic drugs: Secondary | ICD-10-CM | POA: Diagnosis not present

## 2019-03-25 DIAGNOSIS — F10920 Alcohol use, unspecified with intoxication, uncomplicated: Secondary | ICD-10-CM | POA: Diagnosis not present

## 2019-03-25 DIAGNOSIS — R4182 Altered mental status, unspecified: Secondary | ICD-10-CM | POA: Diagnosis present

## 2019-03-25 NOTE — ED Triage Notes (Signed)
Pt BIB [facility] from home, family reports they found her on the floor, incontinent of urine, with slurred speech. Pt lethargic, responds to voice, unable to answers questions. Vomited x 1 with EMS, 4mg  IV zofran given. LSN 1800 tonight. Dr. Rex Kras aware of pt.

## 2019-03-26 ENCOUNTER — Other Ambulatory Visit: Payer: Self-pay

## 2019-03-26 ENCOUNTER — Encounter (HOSPITAL_COMMUNITY): Payer: Self-pay | Admitting: Emergency Medicine

## 2019-03-26 LAB — COMPREHENSIVE METABOLIC PANEL
ALT: 16 U/L (ref 0–44)
AST: 20 U/L (ref 15–41)
Albumin: 4.1 g/dL (ref 3.5–5.0)
Alkaline Phosphatase: 47 U/L (ref 38–126)
Anion gap: 13 (ref 5–15)
BUN: 22 mg/dL — ABNORMAL HIGH (ref 6–20)
CO2: 23 mmol/L (ref 22–32)
Calcium: 9.5 mg/dL (ref 8.9–10.3)
Chloride: 99 mmol/L (ref 98–111)
Creatinine, Ser: 1.03 mg/dL — ABNORMAL HIGH (ref 0.44–1.00)
GFR calc Af Amer: >60 mL/min (ref >60)
GFR calc non Af Amer: >60 mL/min (ref >60)
Glucose, Bld: 121 mg/dL — ABNORMAL HIGH (ref 70–99)
Potassium: 3.1 mmol/L — ABNORMAL LOW (ref 3.5–5.1)
Sodium: 135 mmol/L (ref 135–145)
Total Bilirubin: 0.1 mg/dL — ABNORMAL LOW (ref 0.3–1.2)
Total Protein: 7.8 g/dL (ref 6.5–8.1)

## 2019-03-26 LAB — SALICYLATE LEVEL: Salicylate Lvl: <7 mg/dL (ref 2.8–30.0)

## 2019-03-26 LAB — CBC WITH DIFFERENTIAL/PLATELET
Abs Immature Granulocytes: 0.07 10*3/uL (ref 0.00–0.07)
Basophils Absolute: 0 10*3/uL (ref 0.0–0.1)
Basophils Relative: 0 %
Eosinophils Absolute: 0 10*3/uL (ref 0.0–0.5)
Eosinophils Relative: 0 %
HCT: 37.8 % (ref 36.0–46.0)
Hemoglobin: 12.5 g/dL (ref 12.0–15.0)
Immature Granulocytes: 1 %
Lymphocytes Relative: 12 %
Lymphs Abs: 1.8 10*3/uL (ref 0.7–4.0)
MCH: 30 pg (ref 26.0–34.0)
MCHC: 33.1 g/dL (ref 30.0–36.0)
MCV: 90.6 fL (ref 80.0–100.0)
Monocytes Absolute: 0.5 10*3/uL (ref 0.1–1.0)
Monocytes Relative: 3 %
Neutro Abs: 12.7 10*3/uL — ABNORMAL HIGH (ref 1.7–7.7)
Neutrophils Relative %: 84 %
Platelets: 368 10*3/uL (ref 150–400)
RBC: 4.17 MIL/uL (ref 3.87–5.11)
RDW: 14.1 % (ref 11.5–15.5)
WBC: 15.1 10*3/uL — ABNORMAL HIGH (ref 4.0–10.5)
nRBC: 0 % (ref 0.0–0.2)

## 2019-03-26 LAB — ACETAMINOPHEN LEVEL: Acetaminophen (Tylenol), Serum: <10 ug/mL — ABNORMAL LOW (ref 10–30)

## 2019-03-26 LAB — RAPID URINE DRUG SCREEN, HOSP PERFORMED
Amphetamines: NOT DETECTED
Barbiturates: NOT DETECTED
Benzodiazepines: NOT DETECTED
Cocaine: NOT DETECTED
Opiates: NOT DETECTED
Tetrahydrocannabinol: NOT DETECTED

## 2019-03-26 LAB — ETHANOL: Alcohol, Ethyl (B): 243 mg/dL — ABNORMAL HIGH (ref <10)

## 2019-03-26 NOTE — ED Notes (Signed)
Discharge instructions discussed with pt and family (daughter) at bedside Pt verbalized understanding with no questions at this time.

## 2019-03-26 NOTE — ED Provider Notes (Signed)
[facility] EMERGENCY DEPARTMENT Provider Note  CSN: [id number] Arrival date & time: 03/25/19 2352  Chief Complaint(s) Altered Mental Status  HPI Jean Melton is a 58 y.o. female who presents to the emergency department after being found in the bathtub slurring her speech.  She was found by her sons.  EMS was called out and brought the patient to the emergency department.  Patient remembers being in the bathtub and seen EMS.  She endorsed alcohol consumption since 5 PM yesterday due to increased stress recently.  She denies daily alcohol consumption.  Endorses mild depression but no suicidal ideation or attempt.  She denies any homicidal ideation, AVH.  Denies any prior suicide attempts.  She is in close contact with her psychiatrist.   Patient's daughter arrived and is at bedside providing additional history.  She reports that she came down from [place] after being called by her brothers after they found her mother.  She cooperated that the patient does not drink daily.   Patient currently denies any physical complaints.  She denies any headache, visual disturbance, chest pain, shortness of breath, abdominal pain, nausea, vomiting.  HPI  Past Medical History Past Medical History:  Diagnosis Date  . Asthma   . Depression   . GERD (gastroesophageal reflux disease)   . Hyperosmolarity due to secondary diabetes (HCC) 09/11/2011  . Hypertension    Patient Active Problem List   Diagnosis Date Noted  . Non-ischemic cardiomyopathy (HCC) 10/06/2017  . Bipolar 2 disorder (HCC) 07/18/2017  . Arthritis 07/18/2017  . Iliotibial band syndrome of right side 05/05/2017  . Patellofemoral pain syndrome of both knees 10/28/2016  . Hematochezia   . Esophageal reflux   . Hypercholesterolemia   . Controlled diabetes mellitus type II without complication (HCC)   . Chronic pain of both ankles 07/29/2015  . Mixed incontinence 10/16/2014  . 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  Lichen planus 06/24/2014  . Rash 03/21/2014  . Left elbow pain 10/08/2013  . Hyponatremia 09/09/2011  . Hyperkalemia 09/09/2011  . HTN (hypertension) 09/09/2011  . Dehydration 09/09/2011  . GERD (gastroesophageal reflux disease) 09/09/2011  . Asthma 09/09/2011   Home Medication(s) Prior to Admission medications   Medication Sig Start Date End Date Taking? Authorizing Provider  albuterol (PROVENTIL HFA) 108 (90 BASE) MCG/ACT inhaler Inhale 2 puffs into the lungs every 4 (four) hours as needed for wheezing.  05/11/15  Yes [provider]  aspirin 81 MG tablet Take 81 mg by mouth daily.   Yes [provider]  atenolol (TENORMIN) 100 MG tablet Take 1 tablet (100 mg total) by mouth 2 (two) times daily. 12/07/18 03/25/28 Yes Yates Decamp, MD  cetirizine (ZYRTEC) 10 MG tablet Take 10 mg by mouth daily as needed for allergies.  05/11/15  Yes [provider]  cycloSPORINE (RESTASIS) 0.05 % ophthalmic emulsion Place 1 drop into both eyes 2 (two) times daily. 05/12/15  Yes [provider]  Ferrous Sulfate (IRON) 325 (65 Fe) MG TABS Take 325 mg by mouth daily.    Yes [provider]  fluticasone (FLONASE) 50 MCG/ACT nasal spray Place 1 spray into the nose daily as needed for allergies.  05/18/18  Yes [provider]  Fluticasone-Salmeterol (ADVAIR DISKUS) 250-50 MCG/DOSE AEPB Inhale 1 puff into the lungs 2 (two) times daily. 05/11/15  Yes [provider]  LORazepam (ATIVAN) 0.5 MG tablet Take 0.5 mg by mouth every 6 (six) hours as needed for anxiety.   Yes [provider]  losartan-hydrochlorothiazide (HYZAAR) 100-25 MG tablet Take 1 tablet by mouth once daily 03/25/19  Yes Yates DecampGanji, Jay, MD  Multiple Vitamin (MULTIVITAMIN) capsule Take 1 capsule by mouth daily.   Yes [provider]  omeprazole (PRILOSEC) 20 MG capsule Take 2 capsules (40 mg total) by mouth daily. Patient taking differently: Take 20 mg by mouth daily.  10/27/15  Yes  Vassie LollMadera, Carlos, MD  rosuvastatin (CRESTOR) 10 MG tablet Take 10 mg by mouth at bedtime.  06/25/18  Yes [provider]  sertraline (ZOLOFT) 50 MG tablet Take 50 mg by mouth at bedtime. 03/14/19  Yes [provider]  sitaGLIPtin-metformin (JANUMET) 50-1000 MG tablet Take 1 tablet by mouth 2 (two) times daily with a meal.    Yes [provider]  solifenacin (VESICARE) 5 MG tablet Take 5 mg by mouth daily.   Yes [provider]  glucose blood test strip USE TO CHECK BLOOD SUGAR TWO TIMES DAILY 02/22/17   [provider]  glucose blood test strip USE TO CHECK BLOOD SUGAR TWO TIMES DAILY 02/22/17   [provider]  glucose blood test strip 1 each by Misc.(Non-Drug; Combo Route) route daily. 04/03/18   [provider]  Lancets (ACCU-CHEK MULTICLIX) lancets 1 each by Misc.(Non-Drug; Combo Route) route daily. 08/03/17   [provider]  buPROPion (WELLBUTRIN XL) 150 MG 24 hr tablet Take 150 mg by mouth daily.    09/09/11  [provider]                                                                                                                                    Past Surgical History Past Surgical History:  Procedure Laterality Date  . ABDOMINAL HYSTERECTOMY    . ABDOMINAL SURGERY    . CESAREAN SECTION    . CHOLECYSTECTOMY    . COLONOSCOPY  02/02/2012   Procedure: COLONOSCOPY;  Surgeon: Vertell NovakJames L Edwards Jr., MD;  [facility];  Service: Endoscopy;  Laterality: N/A;  . EYE SURGERY    . STOMACH SURGERY     Family History Family History  Problem Relation Age of Onset  . Diabetes Father   . Hypertension Father   . Heart failure Father     Social History Social History   Tobacco Use  . Smoking status: Never Smoker  . Smokeless tobacco: Never Used  Substance Use Topics  . Alcohol use: Yes    Frequency: Never  . Drug use: No   Allergies Ace inhibitors, Pollen extract, and Sulfamethoxazole-trimethoprim   Review of Systems Review of Systems All other systems are reviewed and are negative for acute change except as noted in the HPI  Physical Exam Vital Signs  I have reviewed the triage vital signs BP (!) 125/97   Pulse 84   Temp 98.2 F (36.8 C) (Oral)   Resp 17   SpO2 98%   Physical Exam Constitutional:  General: She is not in acute distress.    Appearance: She is well-developed. She is not diaphoretic.  HENT:     Head: Normocephalic and atraumatic.     Right Ear: External ear normal.     Left Ear: External ear normal.     Nose: Nose normal.  Eyes:     General: No scleral icterus.       Right eye: No discharge.        Left eye: No discharge.     Conjunctiva/sclera: Conjunctivae normal.     Pupils: Pupils are equal, round, and reactive to light.  Neck:     Musculoskeletal: Normal range of motion and neck supple.  Cardiovascular:     Rate and Rhythm: Normal rate and regular rhythm.     Pulses:          Radial pulses are 2+ on the right side and 2+ on the left side.       Dorsalis pedis pulses are 2+ on the right side and 2+ on the left side.     Heart sounds: Normal heart sounds. No murmur. No friction rub. No gallop.   Pulmonary:     Effort: Pulmonary effort is normal. No respiratory distress.     Breath sounds: Normal breath sounds. No stridor. No wheezing.  Abdominal:     General: There is no distension.     Palpations: Abdomen is soft.     Tenderness: There is no abdominal tenderness.  Musculoskeletal:        General: No tenderness.     Cervical back: She exhibits no bony tenderness.     Thoracic back: She exhibits no bony tenderness.     Lumbar back: She exhibits no bony tenderness.     Comments: Clavicles stable. Chest stable to AP/Lat compression. Pelvis stable to Lat compression. No obvious extremity deformity. No chest or abdominal wall contusion.  Skin:    General: Skin is warm and dry.     Findings: No erythema or rash.  Neurological:     Mental  Status: She is alert and oriented to person, place, and time.     Motor: No tremor.     Coordination: Romberg sign negative. Finger-Nose-Finger Test normal.     Gait: Gait is intact. Gait normal.     Comments: Moving all extremities     ED Results and Treatments Labs (all labs ordered are listed, but only abnormal results are displayed) Labs Reviewed  COMPREHENSIVE METABOLIC PANEL - Abnormal; Notable for the following components:      Result Value   Potassium 3.1 (*)    Glucose, Bld 121 (*)    BUN 22 (*)    Creatinine, Ser 1.03 (*)    Total Bilirubin 0.1 (*)    All other components within normal limits  CBC WITH DIFFERENTIAL/PLATELET - Abnormal; Notable for the following components:   WBC 15.1 (*)    Neutro Abs 12.7 (*)    All other components within normal limits  ACETAMINOPHEN LEVEL - Abnormal; Notable for the following components:   Acetaminophen (Tylenol), Serum <10 (*)    All other components within normal limits  ETHANOL - Abnormal; Notable for the following components:   Alcohol, Ethyl (B) 243 (*)    All other components within normal limits  SALICYLATE LEVEL  RAPID URINE DRUG SCREEN, HOSP PERFORMED  EKG  EKG Interpretation  Date/Time:  Tuesday March 26 2019 00:10:28 EDT Ventricular Rate:  84 PR Interval:  182 QRS Duration: 90 QT Interval:  394 QTC Calculation: 465 R Axis:   27 Text Interpretation:  Normal sinus rhythm Possible Left atrial enlargement T wave abnormality, consider anterior ischemia Abnormal ECG NO STEMI.  No old tracing to compare Confirmed by Drema Pryardama, Davi Rotan [phone]([id number]) on 03/26/2019 1:43:35 AM      Radiology No results found.  Pertinent labs & imaging results that were available during my care of the patient were reviewed by me and considered in my medical decision making (see chart for details).  Medications Ordered in ED  Medications - No data to display                                                                                                                                  Procedures Procedures  (including critical care time)  Medical Decision Making / ED Course I have reviewed the nursing notes for this encounter and the patient's prior records (if available in EHR or on provided paperwork).   Jean Melton was evaluated in Emergency Department on 03/26/2019 for the symptoms described in the history of present illness. She was evaluated in the context of the global COVID-19 pandemic, which necessitated consideration that the patient might be at risk for infection with the SARS-CoV-2 virus that causes COVID-19. Institutional protocols and algorithms that pertain to the evaluation of patients at risk for COVID-19 are in a state of rapid change based on information released by regulatory bodies including the CDC and federal and state organizations. These policies and algorithms were followed during the patient's care in the ED.  Patient presents after being found down in the bathtub. Work-up consistent with alcohol intoxication. No focal deficits concerning for CVA. Patient denies any cardiac symptoms.  Rest of the work-up grossly reassuring.   Patient does not appear to be a threat to herself or others.  She and her daughter feels safe taking the patient home.   The patient is safe for discharge with strict return precautions.  The patient appears reasonably screened and/or stabilized for discharge and I doubt any other medical condition or other [facility] requiring further screening, evaluation, or treatment in the ED at this time prior to discharge.         Final Clinical Impression(s) / ED Diagnoses Final diagnoses:  Alcoholic intoxication without complication (HCC)     The patient appears reasonably screened and/or stabilized for discharge and I doubt any other medical condition or other [facility]  requiring further screening, evaluation, or treatment in the ED at this time prior to discharge.  Disposition: Discharge  Condition: Good  I have discussed the results, Dx and Tx plan with the patient who expressed understanding and agree(s) with the plan. Discharge instructions discussed at great length. The patient was given strict return precautions who verbalized understanding of the  instructions. No further questions at time of discharge.    ED Discharge Orders    None       Follow Up: Psychiatry  Schedule an appointment as soon as possible for a visit       This chart was dictated using voice recognition software.  Despite best efforts to proofread,  errors can occur which can change the documentation meaning.   Nira Conn, MD 03/26/19 [phone]

## 2019-03-26 NOTE — ED Notes (Signed)
Pt was able to ambulate independently to bathroom. Pt stable while ambulation. Family sitting with pt.

## 2019-06-19 ENCOUNTER — Other Ambulatory Visit: Payer: Self-pay | Admitting: Nurse Practitioner

## 2019-06-19 DIAGNOSIS — Z1231 Encounter for screening mammogram for malignant neoplasm of breast: Secondary | ICD-10-CM

## 2019-07-10 ENCOUNTER — Other Ambulatory Visit: Payer: Self-pay | Admitting: Cardiology

## 2019-07-10 DIAGNOSIS — I10 Essential (primary) hypertension: Secondary | ICD-10-CM

## 2019-07-10 DIAGNOSIS — I5042 Chronic combined systolic (congestive) and diastolic (congestive) heart failure: Secondary | ICD-10-CM

## 2019-07-16 ENCOUNTER — Other Ambulatory Visit: Payer: Self-pay | Admitting: Cardiology

## 2019-07-16 DIAGNOSIS — I10 Essential (primary) hypertension: Secondary | ICD-10-CM

## 2019-07-16 DIAGNOSIS — I5042 Chronic combined systolic (congestive) and diastolic (congestive) heart failure: Secondary | ICD-10-CM

## 2019-07-23 ENCOUNTER — Ambulatory Visit: Payer: Medicaid Other | Admitting: Advanced Practice Midwife

## 2019-08-08 ENCOUNTER — Ambulatory Visit: Payer: Medicaid Other

## 2019-08-09 ENCOUNTER — Ambulatory Visit: Payer: Medicaid Other | Admitting: Podiatry

## 2019-08-26 ENCOUNTER — Ambulatory Visit: Payer: Medicaid Other | Admitting: Advanced Practice Midwife

## 2019-08-26 ENCOUNTER — Encounter: Payer: Self-pay | Admitting: Family Medicine

## 2019-08-27 ENCOUNTER — Encounter: Payer: Self-pay | Admitting: *Deleted

## 2019-08-28 ENCOUNTER — Ambulatory Visit: Payer: Medicaid Other | Admitting: Diagnostic Neuroimaging

## 2019-08-28 ENCOUNTER — Encounter: Payer: Self-pay | Admitting: Diagnostic Neuroimaging

## 2019-08-28 ENCOUNTER — Other Ambulatory Visit: Payer: Self-pay

## 2019-08-28 VITALS — BP 128/82 | HR 84 | Temp 97.9°F | Ht 66.0 in | Wt 178.6 lb

## 2019-08-28 DIAGNOSIS — R200 Anesthesia of skin: Secondary | ICD-10-CM

## 2019-08-28 DIAGNOSIS — R202 Paresthesia of skin: Secondary | ICD-10-CM | POA: Diagnosis not present

## 2019-08-28 NOTE — Patient Instructions (Signed)
-   return for nerve testing

## 2019-08-28 NOTE — Progress Notes (Signed)
[facility]  PATIENT: Jean Melton DOB: February 08, 1961  REFERRING CLINICIAN: Berkley Harvey, NP HISTORY FROM: patient  REASON FOR VISIT: new consult    HISTORICAL  CHIEF COMPLAINT:  Chief Complaint  Patient presents with  . Paresthesias in hands, fingers    rm 7 New pt, "left fingers with numbness, weakness, tingling, hotness, runs up my arm; shoots to left knee"    HISTORY OF PRESENT ILLNESS:   59 year old female here for evaluation of left hand numbness and tingling.  History of B12 deficiency and diabetes.  Past 1 year patient has noticed numbness and tingling of the left hand third digit, spreading to the second digit, now the first digit.  Symptoms progressive over time.  Sometimes wakes her up from sleep.  She feels pain as well.   REVIEW OF SYSTEMS: Full 14 system review of systems performed and negative with exception of: As per HPI.  ALLERGIES: Allergies  Allergen Reactions  . Ace Inhibitors     cough  . Pollen Extract Other (See Comments)  . Sulfamethoxazole-Trimethoprim Other (See Comments)    Hospital thinks that this is what caused the the GI bleed Hospital thinks that this is what caused the the GI bleed     HOME MEDICATIONS: Outpatient Medications Prior to Visit  Medication Sig Dispense Refill  . albuterol (PROVENTIL HFA) 108 (90 BASE) MCG/ACT inhaler Inhale 2 puffs into the lungs every 4 (four) hours as needed for wheezing.     Marland Kitchen aspirin 81 MG tablet Take 81 mg by mouth daily.    Marland Kitchen atenolol (TENORMIN) 100 MG tablet Take 1 tablet (100 mg total) by mouth 2 (two) times daily. 180 tablet 3  . cetirizine (ZYRTEC) 10 MG tablet Take 10 mg by mouth daily as needed for allergies.     . cycloSPORINE (RESTASIS) 0.05 % ophthalmic emulsion Place 1 drop into both eyes 2 (two) times daily.    . Ferrous Sulfate (IRON) 325 (65 Fe) MG TABS Take 325 mg by mouth daily.     . fluticasone (FLONASE) 50 MCG/ACT nasal spray Place 1 spray into the nose daily  as needed for allergies.     . Fluticasone-Salmeterol (ADVAIR DISKUS) 250-50 MCG/DOSE AEPB Inhale 1 puff into the lungs 2 (two) times daily.    Marland Kitchen glucose blood test strip USE TO CHECK BLOOD SUGAR TWO TIMES DAILY    . Lancets (ACCU-CHEK MULTICLIX) lancets 1 each by Misc.(Non-Drug; Combo Route) route daily.    Marland Kitchen LORazepam (ATIVAN) 0.5 MG tablet Take 0.5 mg by mouth every 6 (six) hours as needed for anxiety.    Marland Kitchen losartan-hydrochlorothiazide (HYZAAR) 100-25 MG tablet Take 1 tablet by mouth once daily 90 tablet 1  . Multiple Vitamin (MULTIVITAMIN) capsule Take 1 capsule by mouth daily.    Marland Kitchen omeprazole (PRILOSEC) 20 MG capsule Take 2 capsules (40 mg total) by mouth daily. (Patient taking differently: Take 20 mg by mouth daily. ) 60 capsule 1  . rosuvastatin (CRESTOR) 10 MG tablet Take 10 mg by mouth at bedtime.     . sertraline (ZOLOFT) 50 MG tablet Take 50 mg by mouth at bedtime.    . sitaGLIPtin-metformin (JANUMET) 50-1000 MG tablet Take 1 tablet by mouth 2 (two) times daily with a meal.     . solifenacin (VESICARE) 5 MG tablet Take 5 mg by mouth daily.    Marland Kitchen glucose blood test strip USE TO CHECK BLOOD SUGAR TWO TIMES DAILY    . glucose blood test strip 1  each by Misc.(Non-Drug; Combo Route) route daily.     No facility-administered medications prior to visit.    PAST MEDICAL HISTORY: Past Medical History:  Diagnosis Date  . Asthma   . B12 deficiency   . Bipolar 2 disorder (HCC)   . Depression   . Diabetes (HCC)   . GERD (gastroesophageal reflux disease)   . Heart failure (HCC)   . Hypercholesterolemia   . Hyperosmolarity due to secondary diabetes (HCC) 09/11/2011  . Hypertension   . Mixed incontinence   . Non-ischemic cardiomyopathy (HCC)   . Osteoarthritis    knees    PAST SURGICAL HISTORY: Past Surgical History:  Procedure Laterality Date  . ABDOMINAL HYSTERECTOMY    . ABDOMINAL SURGERY    . CATARACT EXTRACTION    . CESAREAN SECTION    . CHOLECYSTECTOMY    . COLONOSCOPY   02/02/2012   Procedure: COLONOSCOPY;  Surgeon: Vertell Novak., MD;  Location: [facility];  Service: Endoscopy;  Laterality: N/A;  . EYE SURGERY    . SKIN BIOPSY    . STOMACH SURGERY      FAMILY HISTORY: Family History  Problem Relation Age of Onset  . Diabetes Father   . Hypertension Father   . Heart failure Father     SOCIAL HISTORY: Social History   Socioeconomic History  . Marital status: Single    Spouse name: Not on file  . Number of children: 5  . Years of education: 12+  . Highest education level: Associate degree: academic program  Occupational History    Comment: disabled  Tobacco Use  . Smoking status: Never Smoker  . Smokeless tobacco: Never Used  Substance and Sexual Activity  . Alcohol use: Yes    Comment: ocass  . Drug use: No  . Sexual activity: Yes    Birth control/protection: Surgical  Other Topics Concern  . Not on file  Social History Narrative   Lives at home with children   Caffeine- coffee 2 c daily   Social Determinants of Health   Financial Resource Strain:   . Difficulty of Paying Living Expenses: Not on file  Food Insecurity:   . Worried About Programme researcher, broadcasting/film/video in the Last Year: Not on file  . Ran Out of Food in the Last Year: Not on file  Transportation Needs:   . Lack of Transportation (Medical): Not on file  . Lack of Transportation (Non-Medical): Not on file  Physical Activity:   . Days of Exercise per Week: Not on file  . Minutes of Exercise per Session: Not on file  Stress:   . Feeling of Stress : Not on file  Social Connections:   . Frequency of Communication with Friends and Family: Not on file  . Frequency of Social Gatherings with Friends and Family: Not on file  . Attends Religious Services: Not on file  . Active Member of Clubs or Organizations: Not on file  . Attends Banker Meetings: Not on file  . Marital Status: Not on file  Intimate Partner Violence:   . Fear of Current or Ex-Partner: Not on  file  . Emotionally Abused: Not on file  . Physically Abused: Not on file  . Sexually Abused: Not on file     PHYSICAL EXAM  GENERAL EXAM/CONSTITUTIONAL: Vitals:  Vitals:   08/28/19 0946  BP: 128/82  Pulse: 84  Temp: 97.9 F (36.6 C)  Weight: 178 lb 9.6 oz (81 kg)  Height: 5\' 6"  (1.676 m)  Body mass index is 28.83 kg/m. Wt Readings from Last 3 Encounters:  08/28/19 178 lb 9.6 oz (81 kg)  03/14/19 173 lb (78.5 kg)  12/07/18 173 lb (78.5 kg)     Patient is in no distress; well developed, nourished and groomed; neck is supple  CARDIOVASCULAR:  Examination of carotid arteries is normal; no carotid bruits  Regular rate and rhythm, no murmurs  Examination of peripheral vascular system by observation and palpation is normal  EYES:  Ophthalmoscopic exam of optic discs and posterior segments is normal; no papilledema or hemorrhages  No exam data present  MUSCULOSKELETAL:  Gait, strength, tone, movements noted in Neurologic exam below  NEUROLOGIC: MENTAL STATUS:  No flowsheet data found.  awake, alert, oriented to person, place and time  recent and remote memory intact  normal attention and concentration  language fluent, comprehension intact, naming intact  fund of knowledge appropriate  CRANIAL NERVE:   2nd - no papilledema on fundoscopic exam  2nd, 3rd, 4th, 6th - pupils equal and reactive to light, visual fields full to confrontation, extraocular muscles intact, no nystagmus  5th - facial sensation symmetric  7th - facial strength symmetric  8th - hearing intact  9th - palate elevates symmetrically, uvula midline  11th - shoulder shrug symmetric  12th - tongue protrusion midline  MOTOR:   normal bulk and tone, full strength in the BUE, BLE; EXCEPT SLIGHT ATROPHY AND WEAKNESS IN LEFT APB  SENSORY:   normal and symmetric to light touch, pinprick, temperature, vibration; EXCEPT DECR PP IN LEFT DIGIT 2  NEGATIVE PHALENS; NEG  TINELS  COORDINATION:   finger-nose-finger, fine finger movements normal  REFLEXES:   deep tendon reflexes 1+ and symmetric  GAIT/STATION:   narrow based gait     DIAGNOSTIC DATA (LABS, IMAGING, TESTING) - I reviewed patient records, labs, notes, testing and imaging myself where available.  Lab Results  Component Value Date   WBC 15.1 (H) 03/26/2019   HGB 12.5 03/26/2019   HCT 37.8 03/26/2019   MCV 90.6 03/26/2019   PLT 368 03/26/2019      Component Value Date/Time   NA 135 03/26/2019 0049   K 3.1 (L) 03/26/2019 0049   CL 99 03/26/2019 0049   CO2 23 03/26/2019 0049   GLUCOSE 121 (H) 03/26/2019 0049   BUN 22 (H) 03/26/2019 0049   CREATININE 1.03 (H) 03/26/2019 0049   CALCIUM 9.5 03/26/2019 0049   PROT 7.8 03/26/2019 0049   ALBUMIN 4.1 03/26/2019 0049   AST 20 03/26/2019 0049   ALT 16 03/26/2019 0049   ALKPHOS 47 03/26/2019 0049   BILITOT 0.1 (L) 03/26/2019 0049   GFRNONAA >60 03/26/2019 0049   GFRAA >60 03/26/2019 0049   No results found for: CHOL, HDL, LDLCALC, LDLDIRECT, TRIG, CHOLHDL Lab Results  Component Value Date   HGBA1C 16.3 (H) 09/09/2011   No results found for: VITAMINB12 No results found for: TSH   07/18/19 - A1c 6.4, B12 393     ASSESSMENT AND PLAN  59 y.o. year old female here with new onset left hand numbness and tingling, suspicious for left carpal tunnel syndrome.  We will proceed with further work-up.  Dx:  1. Numbness and tingling in left hand     PLAN:  LEFT HAND NUMBNESS (? CTS) - check EMG/NCS  Orders Placed This Encounter  Procedures  . NCV with EMG(electromyography)   Return for for NCV/EMG.    Suanne Marker, MD 08/28/2019, 10:31 AM Certified in Neurology, Neurophysiology and  Neuroimaging  [facility] [address] [place], [place] [address] [phone]

## 2019-08-29 ENCOUNTER — Ambulatory Visit
Admission: RE | Admit: 2019-08-29 | Discharge: 2019-08-29 | Disposition: A | Discharge status: Home / Self Care | Payer: Medicaid Other | Source: Ambulatory Visit | Attending: Nurse Practitioner | Admitting: Nurse Practitioner

## 2019-08-29 ENCOUNTER — Other Ambulatory Visit: Payer: Self-pay

## 2019-08-29 DIAGNOSIS — Z1231 Encounter for screening mammogram for malignant neoplasm of breast: Secondary | ICD-10-CM

## 2019-09-19 ENCOUNTER — Telehealth: Payer: Self-pay | Admitting: Diagnostic Neuroimaging

## 2019-09-19 NOTE — Telephone Encounter (Signed)
Pt called stating she has been having head ringing and it has been keeping her up at night and states this is a new symptom. pt was scheduled next avail with MD

## 2019-09-24 ENCOUNTER — Encounter: Payer: Self-pay | Admitting: Diagnostic Neuroimaging

## 2019-09-24 ENCOUNTER — Ambulatory Visit: Payer: Medicaid Other | Admitting: Diagnostic Neuroimaging

## 2019-09-24 ENCOUNTER — Telehealth: Payer: Self-pay | Admitting: Diagnostic Neuroimaging

## 2019-09-24 ENCOUNTER — Other Ambulatory Visit: Payer: Self-pay

## 2019-09-24 VITALS — BP 130/90 | HR 74 | Temp 97.1°F | Ht 66.0 in | Wt 181.0 lb

## 2019-09-24 DIAGNOSIS — H9313 Tinnitus, bilateral: Secondary | ICD-10-CM

## 2019-09-24 NOTE — Progress Notes (Signed)
[facility]  PATIENT: Jean Melton DOB: 1961/03/08  REFERRING CLINICIAN: Iona Hansen, NP HISTORY FROM: patient  REASON FOR VISIT: follow up / new problem   HISTORICAL  CHIEF COMPLAINT:  Chief Complaint  Patient presents with   Head ringing    rm 6 FU requested "I had this when I was young but it went away, now it won't go away and is worse when I lay down, Tylenol doesn't help"    HISTORY OF PRESENT ILLNESS:   UPDATE (09/24/19, VRP): Since last visit, having recurrence of bilateral tinnitus; started in teenage years, fluctuates, left > right. Now constant buzzing sound. No hearing loss. Balance feels off as well. No alleviating or aggravating factors.  PRIOR HPI: 59 year old female here for evaluation of left hand numbness and tingling.  History of B12 deficiency and diabetes.  Past 1 year patient has noticed numbness and tingling of the left hand third digit, spreading to the second digit, now the first digit.  Symptoms progressive over time.  Sometimes wakes her up from sleep.  She feels pain as well.   REVIEW OF SYSTEMS: Full 14 system review of systems performed and negative with exception of: as per hpi.   ALLERGIES: Allergies  Allergen Reactions   Ace Inhibitors     cough   Pollen Extract Other (See Comments)   Sulfamethoxazole-Trimethoprim Other (See Comments)    Hospital thinks that this is what caused the the GI bleed Hospital thinks that this is what caused the the GI bleed     HOME MEDICATIONS: Outpatient Medications Prior to Visit  Medication Sig Dispense Refill   albuterol (PROVENTIL HFA) 108 (90 BASE) MCG/ACT inhaler Inhale 2 puffs into the lungs every 4 (four) hours as needed for wheezing.      aspirin 81 MG tablet Take 81 mg by mouth daily.     atenolol (TENORMIN) 100 MG tablet Take 1 tablet (100 mg total) by mouth 2 (two) times daily. 180 tablet 3   cetirizine (ZYRTEC) 10 MG tablet Take 10 mg by mouth daily as needed for  allergies.      cycloSPORINE (RESTASIS) 0.05 % ophthalmic emulsion Place 1 drop into both eyes 2 (two) times daily.     Ferrous Sulfate (IRON) 325 (65 Fe) MG TABS Take 325 mg by mouth daily.      fluticasone (FLONASE) 50 MCG/ACT nasal spray Place 1 spray into the nose daily as needed for allergies.      Fluticasone-Salmeterol (ADVAIR DISKUS) 250-50 MCG/DOSE AEPB Inhale 1 puff into the lungs 2 (two) times daily.     glucose blood test strip USE TO CHECK BLOOD SUGAR TWO TIMES DAILY     Lancets (ACCU-CHEK MULTICLIX) lancets 1 each by Misc.(Non-Drug; Combo Route) route daily.     LORazepam (ATIVAN) 0.5 MG tablet Take 0.5 mg by mouth every 6 (six) hours as needed for anxiety.     losartan-hydrochlorothiazide (HYZAAR) 100-25 MG tablet Take 1 tablet by mouth once daily 90 tablet 1   Multiple Vitamin (MULTIVITAMIN) capsule Take 1 capsule by mouth daily.     omeprazole (PRILOSEC) 20 MG capsule Take 2 capsules (40 mg total) by mouth daily. (Patient taking differently: Take 20 mg by mouth daily. ) 60 capsule 1   rosuvastatin (CRESTOR) 10 MG tablet Take 10 mg by mouth at bedtime.      sertraline (ZOLOFT) 50 MG tablet Take 50 mg by mouth at bedtime.     sitaGLIPtin-metformin (JANUMET) 50-1000 MG tablet Take 1  tablet by mouth 2 (two) times daily with a meal.      solifenacin (VESICARE) 5 MG tablet Take 5 mg by mouth daily.     No facility-administered medications prior to visit.    PAST MEDICAL HISTORY: Past Medical History:  Diagnosis Date   Asthma    B12 deficiency    Bipolar 2 disorder (HCC)    Depression    Diabetes (HCC)    GERD (gastroesophageal reflux disease)    Heart failure (HCC)    Hypercholesterolemia    Hyperosmolarity due to secondary diabetes (HCC) 09/11/2011   Hypertension    Mixed incontinence    Non-ischemic cardiomyopathy (HCC)    Osteoarthritis    knees    PAST SURGICAL HISTORY: Past Surgical History:  Procedure Laterality Date   ABDOMINAL  HYSTERECTOMY     ABDOMINAL SURGERY     CATARACT EXTRACTION     CESAREAN SECTION     CHOLECYSTECTOMY     COLONOSCOPY  02/02/2012   Procedure: COLONOSCOPY;  Surgeon: Vertell Novak., MD;  Location: [facility];  Service: Endoscopy;  Laterality: N/A;   EYE SURGERY     SKIN BIOPSY     STOMACH SURGERY      FAMILY HISTORY: Family History  Problem Relation Age of Onset   Diabetes Father    Hypertension Father    Heart failure Father     SOCIAL HISTORY: Social History   Socioeconomic History   Marital status: Single    Spouse name: Not on file   Number of children: 5   Years of education: 12+   Highest education level: Associate degree: academic program  Occupational History    Comment: disabled  Tobacco Use   Smoking status: Never Smoker   Smokeless tobacco: Never Used  Substance and Sexual Activity   Alcohol use: Yes    Comment: ocass   Drug use: No   Sexual activity: Yes    Birth control/protection: Surgical  Other Topics Concern   Not on file  Social History Narrative   Lives at home with children   Caffeine- coffee 2 c daily   Social Determinants of Health   Financial Resource Strain:    Difficulty of Paying Living Expenses:   Food Insecurity:    Worried About Programme researcher, broadcasting/film/video in the Last Year:    Barista in the Last Year:   Transportation Needs:    Freight forwarder (Medical):    Lack of Transportation (Non-Medical):   Physical Activity:    Days of Exercise per Week:    Minutes of Exercise per Session:   Stress:    Feeling of Stress :   Social Connections:    Frequency of Communication with Friends and Family:    Frequency of Social Gatherings with Friends and Family:    Attends Religious Services:    Active Member of Clubs or Organizations:    Attends Banker Meetings:    Marital Status:   Intimate Partner Violence:    Fear of Current or Ex-Partner:    Emotionally Abused:     Physically Abused:    Sexually Abused:      PHYSICAL EXAM  GENERAL EXAM/CONSTITUTIONAL: Vitals:  Vitals:   09/24/19 1027 09/24/19 1032  BP: (!) 148/95 130/90  Pulse: 73 74  Temp: (!) 97.1 F (36.2 C)   Weight: 181 lb (82.1 kg)   Height: 5\' 6"  (1.676 m)    Body mass index is 29.21 kg/m. Wt Readings  from Last 3 Encounters:  09/24/19 181 lb (82.1 kg)  08/28/19 178 lb 9.6 oz (81 kg)  03/14/19 173 lb (78.5 kg)    Patient is in no distress; well developed, nourished and groomed; neck is supple  CARDIOVASCULAR:  Examination of carotid arteries is normal; no carotid bruits  Regular rate and rhythm, no murmurs  Examination of peripheral vascular system by observation and palpation is normal  EYES:  Ophthalmoscopic exam of optic discs and posterior segments is normal; no papilledema or hemorrhages No exam data present  MUSCULOSKELETAL:  Gait, strength, tone, movements noted in Neurologic exam below  NEUROLOGIC: MENTAL STATUS:  No flowsheet data found.  awake, alert, oriented to person, place and time  recent and remote memory intact  normal attention and concentration  language fluent, comprehension intact, naming intact  fund of knowledge appropriate  CRANIAL NERVE:   2nd - no papilledema on fundoscopic exam  2nd, 3rd, 4th, 6th - pupils equal and reactive to light, visual fields full to confrontation, extraocular muscles intact, no nystagmus  5th - facial sensation symmetric  7th - facial strength symmetric  8th - hearing intact  9th - palate elevates symmetrically, uvula midline  11th - shoulder shrug symmetric  12th - tongue protrusion midline  MOTOR:   normal bulk and tone, full strength in the BUE, BLE; EXCEPT SLIGHT ATROPHY AND WEAKNESS IN LEFT APB  SENSORY:   normal and symmetric to light touch, pinprick, temperature, vibration; EXCEPT DECR PP IN LEFT DIGIT 2  NEGATIVE PHALENS; NEG TINELS  COORDINATION:   finger-nose-finger, fine  finger movements normal  REFLEXES:   deep tendon reflexes 1+ and symmetric  GAIT/STATION:   narrow based gait     DIAGNOSTIC DATA (LABS, IMAGING, TESTING) - I reviewed patient records, labs, notes, testing and imaging myself where available.  Lab Results  Component Value Date   WBC 15.1 (H) 03/26/2019   HGB 12.5 03/26/2019   HCT 37.8 03/26/2019   MCV 90.6 03/26/2019   PLT 368 03/26/2019      Component Value Date/Time   NA 135 03/26/2019 0049   K 3.1 (L) 03/26/2019 0049   CL 99 03/26/2019 0049   CO2 23 03/26/2019 0049   GLUCOSE 121 (H) 03/26/2019 0049   BUN 22 (H) 03/26/2019 0049   CREATININE 1.03 (H) 03/26/2019 0049   CALCIUM 9.5 03/26/2019 0049   PROT 7.8 03/26/2019 0049   ALBUMIN 4.1 03/26/2019 0049   AST 20 03/26/2019 0049   ALT 16 03/26/2019 0049   ALKPHOS 47 03/26/2019 0049   BILITOT 0.1 (L) 03/26/2019 0049   GFRNONAA >60 03/26/2019 0049   GFRAA >60 03/26/2019 0049   No results found for: CHOL, HDL, LDLCALC, LDLDIRECT, TRIG, CHOLHDL Lab Results  Component Value Date   HGBA1C 16.3 (H) 09/09/2011   No results found for: VITAMINB12 No results found for: TSH   07/18/19 - A1c 6.4, B12 393     ASSESSMENT AND PLAN  59 y.o. year old female here with new onset left hand numbness and tingling, suspicious for left carpal tunnel syndrome.  Also with tinnitus and balance diff.   Dx:  1. Tinnitus of both ears     PLAN:  TINNITUS / GAIT DIFFICULTY - check MRI brain and IAC (with and without) ; eval for mass or demyelinating dz - follow up with audiology clinic (ordered by PCP)  LEFT HAND NUMBNESS (? CTS) - check EMG/NCS  Orders Placed This Encounter  Procedures   MR BRAIN/IAC W WO CONTRAST  Return for for NCV/EMG.    Suanne Marker, MD 09/24/2019, 11:03 AM Certified in Neurology, Neurophysiology and Neuroimaging  [facility] [address], [address] [place], [place] [address] [phone]

## 2019-09-24 NOTE — Patient Instructions (Signed)
TINNITUS / GAIT DIFFICULTY - check MRI brain - follow up with audiology clinic (ordered by PCP)  LEFT HAND NUMBNESS - check EMG/NCS (nerve testing)

## 2019-09-24 NOTE — Telephone Encounter (Signed)
Medicaid order sent to [facility]. They will obtain the auth and reach out to the patient to schedule.  

## 2019-09-26 ENCOUNTER — Ambulatory Visit: Payer: Medicaid Other

## 2019-09-30 ENCOUNTER — Ambulatory Visit: Payer: Medicaid Other | Attending: Internal Medicine

## 2019-09-30 DIAGNOSIS — Z23 Encounter for immunization: Secondary | ICD-10-CM

## 2019-09-30 NOTE — Progress Notes (Signed)
   Covid-19 Vaccination Clinic  Name:  Jean Melton    MRN: [id number] DOB: August 22, 1960  09/30/2019  Jean Melton was observed post Covid-19 immunization for 15 minutes without incident. She was provided with Vaccine Information Sheet and instruction to access the V-Safe system.   Jean Melton was instructed to call 911 with any severe reactions post vaccine: Marland Kitchen Difficulty breathing  . Swelling of face and throat  . A fast heartbeat  . A bad rash all over body  . Dizziness and weakness   Immunizations Administered    Name Date Dose VIS Date Route   Pfizer COVID-19 Vaccine 09/30/2019 10:41 AM 0.3 mL 06/14/2019 Intramuscular   Manufacturer: [employer], [employer]   Lot: [id number]   NDC: [address]-4

## 2019-10-03 ENCOUNTER — Encounter: Payer: Medicaid Other | Admitting: Diagnostic Neuroimaging

## 2019-10-08 ENCOUNTER — Ambulatory Visit: Payer: Medicaid Other | Admitting: Advanced Practice Midwife

## 2019-10-22 ENCOUNTER — Ambulatory Visit: Payer: Medicaid Other | Attending: Internal Medicine

## 2019-10-22 DIAGNOSIS — Z23 Encounter for immunization: Secondary | ICD-10-CM

## 2019-10-22 NOTE — Progress Notes (Signed)
   Covid-19 Vaccination Clinic  Name:  Jean Melton    MRN: [id number] DOB: 29-Aug-1960  10/22/2019  [name] was observed post Covid-19 immunization for 15 minutes without incident. She was provided with Vaccine Information Sheet and instruction to access the V-Safe system.   [name] was instructed to call 911 with any severe reactions post vaccine: Marland Kitchen Difficulty breathing  . Swelling of face and throat  . A fast heartbeat  . A bad rash all over body  . Dizziness and weakness   Immunizations Administered    Name Date Dose VIS Date Route   Pfizer COVID-19 Vaccine 10/22/2019 11:02 AM 0.3 mL 08/28/2018 Intramuscular   Manufacturer: [employer], [employer]   Lot: [id number]   NDC: [address]-9

## 2019-11-11 ENCOUNTER — Inpatient Hospital Stay: Admission: RE | Admit: 2019-11-11 | Payer: Medicaid Other | Source: Ambulatory Visit

## 2019-12-05 ENCOUNTER — Encounter: Payer: Medicaid Other | Admitting: Diagnostic Neuroimaging

## 2019-12-05 ENCOUNTER — Other Ambulatory Visit: Payer: Self-pay

## 2019-12-05 ENCOUNTER — Ambulatory Visit (INDEPENDENT_AMBULATORY_CARE_PROVIDER_SITE_OTHER): Payer: Medicaid Other | Admitting: Diagnostic Neuroimaging

## 2019-12-05 DIAGNOSIS — Z0289 Encounter for other administrative examinations: Secondary | ICD-10-CM

## 2019-12-05 DIAGNOSIS — G5602 Carpal tunnel syndrome, left upper limb: Secondary | ICD-10-CM | POA: Diagnosis not present

## 2019-12-05 DIAGNOSIS — R200 Anesthesia of skin: Secondary | ICD-10-CM

## 2019-12-05 NOTE — Procedures (Signed)
[facility]  NCS (NERVE CONDUCTION STUDY) WITH EMG (ELECTROMYOGRAPHY) REPORT   STUDY DATE: 12/05/19 PATIENT NAME: Jean Melton DOB: 08-04-1960 MRN: [id number]  ORDERING CLINICIAN: Andrey Spearman, MD   TECHNOLOGIST: Sherre Scarlet ELECTROMYOGRAPHER: Earlean Polka. Khoen Genet, MD  CLINICAL INFORMATION: 59 year old female with left hand numbness.  FINDINGS: NERVE CONDUCTION STUDY:  Left median motor response has prolonged latency (5.6 ms), normal amplitude, normal conduction velocity.  Right median and left ulnar motor responses are normal.  Left median sensory sponsors prolonged peak latency and decreased amplitude.  Right median and left ulnar sensory responses are normal.   NEEDLE ELECTROMYOGRAPHY:  Needle examination of left upper extremity is normal.   IMPRESSION:   Abnormal study demonstrating: - Left median neuropathy at the wrist consistent with moderate left carpal tunnel syndrome.    INTERPRETING PHYSICIAN:  Penni Bombard, MD Certified in Neurology, Neurophysiology and Neuroimaging  [facility] [address] [place], [place] [address] [phone]  [facility]    Nerve / Sites Muscle Latency Ref. Amplitude Ref. Rel Amp Segments Distance Velocity Ref. Area    ms ms mV mV %  cm m/s m/s mVms  L Median - APB     Wrist APB 5.6 ?4.4 8.5 ?4.0 100 Wrist - APB 7   35.7     Upper arm APB 10.0  8.2  96.7 Upper arm - Wrist 23 53 ?49 34.8  R Median - APB     Wrist APB 3.5 ?4.4 6.9 ?4.0 100 Wrist - APB 7   32.6     Upper arm APB 7.6  6.9  100 Upper arm - Wrist 23 57 ?49 32.7  L Ulnar - ADM     Wrist ADM 2.5 ?3.3 14.6 ?6.0 100 Wrist - ADM 7   39.6     B.Elbow ADM 5.8  13.6  92.9 B.Elbow - Wrist 20 62 ?49 37.9     A.Elbow ADM 7.4  13.0  95.9 A.Elbow - B.Elbow 10 61 ?49 37.0           SNC    Nerve / Sites Rec. Site Peak Lat Ref.  Amp Ref. Segments Distance Peak Diff Ref.    ms ms V V  cm ms ms  R Median, Ulnar -  Transcarpal comparison     Median Palm Wrist 2.2 ?2.2 83 ?35 Median Palm - Wrist 8       Ulnar Palm Wrist 1.8 ?2.2 51 ?12 Ulnar Palm - Wrist 8          Median Palm - Ulnar Palm  0.4 ?0.4  L Median - Orthodromic (Dig II, Mid palm)     Dig II Wrist 5.1 ?3.4 6 ?10 Dig II - Wrist 13    R Median - Orthodromic (Dig II, Mid palm)     Dig II Wrist 3.2 ?3.4 13 ?10 Dig II - Wrist 13    L Ulnar - Orthodromic, (Dig V, Mid palm)     Dig V Wrist 2.5 ?3.1 10 ?5 Dig V - Wrist 34               F  Wave    Nerve F Lat Ref.   ms ms  L Ulnar - ADM 26.5 ?32.0       EMG Summary Table    Spontaneous MUAP Recruitment  Muscle IA Fib PSW Fasc Other Amp Dur. Poly Pattern  L. Deltoid Normal None None None _______ Normal Normal Normal Normal  L. Biceps brachii Normal None None None _______ Normal Normal Normal Normal  L. Triceps brachii Normal None None None _______ Normal Normal Normal Normal  L. Flexor carpi radialis Normal None None None _______ Normal Normal Normal Normal  L. First dorsal interosseous Normal None None None _______ Normal Normal Normal Normal

## 2019-12-10 ENCOUNTER — Ambulatory Visit
Admission: RE | Admit: 2019-12-10 | Discharge: 2019-12-10 | Disposition: A | Discharge status: Home / Self Care | Payer: Medicaid Other | Source: Ambulatory Visit | Attending: Diagnostic Neuroimaging | Admitting: Diagnostic Neuroimaging

## 2019-12-10 ENCOUNTER — Other Ambulatory Visit: Payer: Self-pay

## 2019-12-10 DIAGNOSIS — H9313 Tinnitus, bilateral: Secondary | ICD-10-CM | POA: Diagnosis not present

## 2019-12-10 MED ORDER — GADOBENATE DIMEGLUMINE 529 MG/ML IV SOLN
16.0000 mL | Freq: Once | INTRAVENOUS | Status: AC | PRN
  Administered 2019-12-10: 16 mL via INTRAVENOUS

## 2019-12-18 ENCOUNTER — Telehealth: Payer: Self-pay | Admitting: *Deleted

## 2019-12-18 NOTE — Telephone Encounter (Signed)
Spoke with patient and informed her the MRI brain showed a few non-specific spots / scar tissue, but overall unremarkable imaging results with no acute findings. Patient verbalized understanding, appreciation.

## 2020-01-02 ENCOUNTER — Telehealth: Payer: Self-pay

## 2020-01-02 NOTE — Telephone Encounter (Signed)
Pt is calling for information regarding her hand surgery referral

## 2020-01-02 NOTE — Telephone Encounter (Signed)
Called patient and left her a message relaying that her referral had been sent and that I will follow back up with her on Tuesday .

## 2020-01-09 NOTE — Telephone Encounter (Signed)
Patient called me back and relayed she may have to CX her apt because she has the medicaid plan . [facility]

## 2020-03-12 ENCOUNTER — Other Ambulatory Visit: Payer: Self-pay

## 2020-03-12 ENCOUNTER — Ambulatory Visit: Payer: Medicaid Other

## 2020-03-12 DIAGNOSIS — I5032 Chronic diastolic (congestive) heart failure: Secondary | ICD-10-CM

## 2020-03-12 DIAGNOSIS — I428 Other cardiomyopathies: Secondary | ICD-10-CM

## 2020-03-19 ENCOUNTER — Ambulatory Visit: Payer: Medicaid Other | Admitting: Cardiology

## 2020-03-19 ENCOUNTER — Other Ambulatory Visit: Payer: Self-pay

## 2020-03-19 ENCOUNTER — Encounter: Payer: Self-pay | Admitting: Cardiology

## 2020-03-19 VITALS — BP 144/89 | HR 83 | Resp 16 | Ht 66.0 in | Wt 182.0 lb

## 2020-03-19 DIAGNOSIS — I10 Essential (primary) hypertension: Secondary | ICD-10-CM

## 2020-03-19 DIAGNOSIS — I5032 Chronic diastolic (congestive) heart failure: Secondary | ICD-10-CM

## 2020-03-19 DIAGNOSIS — I428 Other cardiomyopathies: Secondary | ICD-10-CM

## 2020-03-19 MED ORDER — SPIRONOLACTONE 25 MG PO TABS: 25.0000 mg | ORAL_TABLET | Freq: Every day | ORAL | 1 refills | Status: DC

## 2020-03-19 NOTE — Progress Notes (Signed)
Primary Physician/Referring:  Iona Hansen, NP  Patient ID: Jean Melton, female    DOB: 1961-03-13, 59 y.o.   MRN: [id number]  Chief Complaint  Patient presents with  . Follow-up    1 year  . Cardiomyopathy   HPI:    Jean Melton  is a 59 y.o. female with with type II diabetes, mixed hyperlipiedmia, and hypertension. She had a syncope episode in March 2019. Nuclear stress test in April 2019 had revealed no evidence of ischemia and EF around 40% by echocardiogram at that time. She has nonischemic cardiomyopathy and chronic diastolic heart failure probably related to hypertensive heart disease.   The patient presents for annual follow up of nonischemic cardiomyopathy and chronic diastolic heart failure. She is presently doing well. She reports for the past couple weeks she has had intermittent heart burn localized to the left side of the chest. The pain is relieved with alka seltzer. She is on Prilosec. Denies dyspnea, orthopnea, leg swelling, palpitations. Denies dizziness, syncope.   Past Medical History:  Diagnosis Date  . Asthma   . B12 deficiency   . Bipolar 2 disorder (HCC)   . Depression   . Diabetes (HCC)   . GERD (gastroesophageal reflux disease)   . Heart failure (HCC)   . Hypercholesterolemia   . Hyperosmolarity due to secondary diabetes (HCC) 09/11/2011  . Hypertension   . Mixed incontinence   . Non-ischemic cardiomyopathy (HCC)   . Osteoarthritis    knees   Past Surgical History:  Procedure Laterality Date  . ABDOMINAL HYSTERECTOMY    . ABDOMINAL SURGERY    . CATARACT EXTRACTION    . CESAREAN SECTION    . CHOLECYSTECTOMY    . COLONOSCOPY  02/02/2012   Procedure: COLONOSCOPY;  Surgeon: Vertell Novak., MD;  Location: [facility];  Service: Endoscopy;  Laterality: N/A;  . EYE SURGERY    . SKIN BIOPSY    . STOMACH SURGERY     Family History  Problem Relation Age of Onset  . Diabetes Father   . Hypertension Father   . Heart failure Father      Social History   Tobacco Use  . Smoking status: Never Smoker  . Smokeless tobacco: Never Used  Substance Use Topics  . Alcohol use: Yes    Comment: ocass   Marital Status: Single  ROS  Review of Systems  Constitutional: Negative for malaise/fatigue.  Cardiovascular: Negative for chest pain, dyspnea on exertion, leg swelling, orthopnea, palpitations and syncope.  Gastrointestinal: Positive for heartburn.  Neurological: Negative for dizziness and light-headedness.   Objective  Blood pressure (!) 144/89, pulse 83, resp. rate 16, height 5\' 6"  (1.676 m), weight 182 lb (82.6 kg), SpO2 95 %.  Vitals with BMI 03/19/2020 09/24/2019 09/24/2019  Height 5\' 6"  - 5\' 6"   Weight 182 lbs - 181 lbs  BMI 29.39 - 29.23  Systolic 144 130 09/26/2019  Diastolic 89 90 95  Pulse 83 74 73     Physical Exam Constitutional:      Comments: Overweight female in no acute distress  HENT:     Head: Atraumatic.  Cardiovascular:     Rate and Rhythm: Normal rate and regular rhythm.     Pulses: Intact distal pulses.     Heart sounds: Normal heart sounds. No murmur heard.  No gallop.      Comments: No JVD. No lower extremity edema.  Pulmonary:     Effort: Pulmonary effort is normal.  Breath sounds: Normal breath sounds.  Neurological:     Mental Status: She is alert and oriented to person, place, and time.    Laboratory examination:   Recent Labs    03/26/19 0049  NA 135  K 3.1*  CL 99  CO2 23  GLUCOSE 121*  BUN 22*  CREATININE 1.03*  CALCIUM 9.5  GFRNONAA >60  GFRAA >60   CrCl cannot be calculated (Patient's most recent lab result is older than the maximum 21 days allowed.).  CMP Latest Ref Rng & Units 03/26/2019 10/27/2015 10/26/2015  Glucose 70 - 99 mg/dL 106(Y) 694(W) 546(E)  BUN 6 - 20 mg/dL 70(J) 13 16  Creatinine 0.44 - 1.00 mg/dL 5.00(X) 3.81 8.29  Sodium 135 - 145 mmol/L 135 140 135  Potassium 3.5 - 5.1 mmol/L 3.1(L) 4.3 4.4  Chloride 98 - 111 mmol/L 99 108 102  CO2 22 - 32 mmol/L 23  27 26   Calcium 8.9 - 10.3 mg/dL 9.5 9.8  Total Protein 6.5 - 8.1 g/dL 7.8 - 8.4(H)  Total Bilirubin 0.3 - 1.2 mg/dL 93.7) - 1.6(R)  Alkaline Phos 38 - 126 U/L 47 - 53  AST 15 - 41 U/L 20 - 13(L)  ALT 0 - 44 U/L 16 - 11(L)   CBC Latest Ref Rng & Units 03/26/2019 10/27/2015 10/26/2015  WBC 4.0 - 10.5 K/uL 15.1(H) - -  Hemoglobin 12.0 - 15.0 g/dL 10/28/2015 11.7(L) 10.9(L)  Hematocrit 36 - 46 % 37.8 37.0 33.7(L)  Platelets 150 - 400 K/uL 368 - -    Lipid Panel No results for input(s): CHOL, TRIG, LDLCALC, VLDL, HDL, CHOLHDL, LDLDIRECT in the last 8760 hours.  HEMOGLOBIN A1C Lab Results  Component Value Date   HGBA1C 16.3 (H) 09/09/2011   MPG 421 (H) 09/09/2011   TSH No results for input(s): TSH in the last 8760 hours.  External labs:   AIC 5.9% 01/22/2020  CMP 10/17/2019: Potassium 3.6, BUN 14, Creatinine 0.97, glucose 121  CBC 07/18/2019: Hemoglobin 11.5 Hematocrit 34.9   Lipid Profile 07/18/2019: Total cholesterol 180, LDL 98, HDL 64, Triglycerides 91  Medications and allergies   Allergies  Allergen Reactions  . Ace Inhibitors     cough  . Pollen Extract Other (See Comments)  . Sulfamethoxazole-Trimethoprim Other (See Comments)    Hospital thinks that this is what caused the the GI bleed Hospital thinks that this is what caused the the GI bleed      Outpatient Medications Prior to Visit  Medication Sig Dispense Refill  . albuterol (PROVENTIL HFA) 108 (90 BASE) MCG/ACT inhaler Inhale 2 puffs into the lungs every 4 (four) hours as needed for wheezing.     . ARIPiprazole (ABILIFY) 10 MG tablet Take 10 mg by mouth daily.    07/20/2019 aspirin 81 MG tablet Take 81 mg by mouth daily.    Marland Kitchen atenolol (TENORMIN) 100 MG tablet Take 1 tablet (100 mg total) by mouth 2 (two) times daily. 180 tablet 3  . cetirizine (ZYRTEC) 10 MG tablet Take 10 mg by mouth daily as needed for allergies.     . cycloSPORINE (RESTASIS) 0.05 % ophthalmic emulsion Place 1 drop into both eyes 2 (two) times daily.     . Ferrous Sulfate (IRON) 325 (65 Fe) MG TABS Take 325 mg by mouth daily.     . fluticasone (FLONASE) 50 MCG/ACT nasal spray Place 1 spray into the nose daily as needed for allergies.     . Fluticasone-Salmeterol (ADVAIR DISKUS) 250-50 MCG/DOSE AEPB Inhale  1 puff into the lungs 2 (two) times daily.    Marland Kitchen glucose blood test strip USE TO CHECK BLOOD SUGAR TWO TIMES DAILY    . Lancets (ACCU-CHEK MULTICLIX) lancets 1 each by Misc.(Non-Drug; Combo Route) route daily.    Marland Kitchen LORazepam (ATIVAN) 1 MG tablet Take 1-2 mg by mouth 2 (two) times daily as needed.    Marland Kitchen losartan-hydrochlorothiazide (HYZAAR) 100-25 MG tablet Take 1 tablet by mouth once daily 90 tablet 1  . methocarbamol (ROBAXIN) 500 MG tablet Take 500 mg by mouth every 8 (eight) hours as needed.    . Multiple Vitamin (MULTIVITAMIN) capsule Take 1 capsule by mouth daily.    Marland Kitchen omeprazole (PRILOSEC) 20 MG capsule Take 2 capsules (40 mg total) by mouth daily. (Patient taking differently: Take 20 mg by mouth daily. ) 60 capsule 1  . rosuvastatin (CRESTOR) 10 MG tablet Take 10 mg by mouth at bedtime.     . sertraline (ZOLOFT) 50 MG tablet Take 50 mg by mouth at bedtime.    . sitaGLIPtin-metformin (JANUMET) 50-1000 MG tablet Take 1 tablet by mouth 2 (two) times daily with a meal.     . solifenacin (VESICARE) 5 MG tablet Take 5 mg by mouth daily.    Marland Kitchen LORazepam (ATIVAN) 0.5 MG tablet Take 0.5 mg by mouth every 6 (six) hours as needed for anxiety.     No facility-administered medications prior to visit.    Radiology:   No results found.  Cardiac Studies:   Exercise myoview stress 10/06/2017:  1. The patient performed treadmill exercise using a Bruce protocol, completing 6 minutes. The patient completed an estimated workload of 7.05 METS, reaching 99% of the maximum predicted heart rate. Normal hemodynamic response. Fair exercise capacity. No stress symptoms reported. No ischemic changes seen on stress electrocardiogram. 2. The overall quality of  the study is excellent. There is no evidence of abnormal lung activity. Stress and rest SPECT images demonstrate homogeneous tracer distribution throughout the myocardium. Gated SPECT imaging reveals normal myocardial thickening and wall motion. The left ventricular ejection fraction was normal (50%).   3. Low risk study.  Holter Monitor 24 hours 11/09/2017: No symptoms reported. Predominant rhythm is sinus tachycardia. Patient had 18 hours of sinus tachycardia, maximum heart rate 152 bpm. Average heart rate 106 bpm. Occasional PVCs.  Echocardiogram 03/12/2020 Normal LV systolic function with visual EF 50-55%. Left ventricle cavity  is normal in size. Moderate left ventricular hypertrophy. Normal global wall motion. Doppler evidence of grade II diastolic dysfunction,  indeterminate LAP.  Left atrial cavity is mildly dilated.  Moderate mitral valve leaflet thickening. Normal mitral valve leaflet mobility. No evidence of mitral stenosis. Mild (Grade I) mitral regurgitation.  Mild tricuspid regurgitation.  Mild pulmonic regurgitation.  Insignificant pericardial effusion.  Compared to prior study 11/15/2018: LVEF is improved from 40-45% to 50-55%, Grade 1 to Grade 2 DD, no significant change in valvular heart disease.   EKG  EKG 03/19/2020: Normal sinus rhythm at rate of 76 bpm, normal axis.  Poor R wave progression, cannot exclude anteroseptal infarct old.  Nonspecific T abnormality, cannot exclude anterior ischemia.  No significant change from 03/26/2019.  Assessment     ICD-10-CM   1. Non-ischemic cardiomyopathy (HCC)  I42.8 EKG 12-Lead  2. Essential hypertension  I10 spironolactone (ALDACTONE) 25 MG tablet    Basic metabolic panel  3. Chronic diastolic heart failure (HCC)  H15.05 spironolactone (ALDACTONE) 25 MG tablet    Brain natriuretic peptide     Medications Discontinued During  This Encounter  Medication Reason  . LORazepam (ATIVAN) 0.5 MG tablet Dose change    Meds ordered this  encounter  Medications  . spironolactone (ALDACTONE) 25 MG tablet    Sig: Take 1 tablet (25 mg total) by mouth daily.    Dispense:  90 tablet    Refill:  1    Recommendations:   EESHA SCHMALTZ is a 59 y.o. with type II diabetes, mixed hyperlipiedmia, and hypertension. In 2019 after a syncopal episode she was diagnosed with nonischemic cardiomyopathy and chronic systolic and diastolic heart failure. Systolic heart failure is no longer present. The patient presents today for annual follow up.   Overall, she is feeling well compared with last year and is asymptomatic. No clinical evidence of decompensated heart failure. I reviewed her echocardiogram and LVEF is improved to from 40-45% to 50-55% however diastolic dysfunction worsened. There was no significant change in valvular disease.   Her blood pressure is elevated. I will start her on Spironolactone 25 mg daily for better blood pressure control. We will check a BMP in 10 days after starting the medication. I will check a BNP at that time as well.  External labs were reviewed. Lipids are controlled, diabetes is well controlled. Renal function is normal.  We discussed weight loss. She has been making dietary changes. The patient states she tries to walk for exercise, but has difficulty because of "weak ankles" for which she sees sports medicine. She is encouraged to aim for 30 minutes of exercise daily. I will see her back in 6 weeks for follow up of hypertension.  Aris Lot, PA Student 03/21/20 10:34 AM  Patient seen and examined in conjunction with Aris Lot, PA second year student at [employer].  Time spent is in direct patient face to face encounter not including the teaching and training involved.    Yates Decamp, MD, [facility] 03/21/2020, 10:34 AM Office: [phone]

## 2020-04-06 ENCOUNTER — Telehealth: Payer: Self-pay | Admitting: Diagnostic Neuroimaging

## 2020-04-06 NOTE — Telephone Encounter (Signed)
Pt. Came in to the lobby today needing the information of the surgeon she was told to call at her last visit about her hand. Best call back is [phone].

## 2020-04-07 NOTE — Telephone Encounter (Addendum)
[facility] Telephone [phone]  - Spoke to Patient

## 2020-04-10 ENCOUNTER — Other Ambulatory Visit: Payer: Self-pay | Admitting: Cardiology

## 2020-04-13 ENCOUNTER — Other Ambulatory Visit: Payer: Self-pay

## 2020-04-13 MED ORDER — ATENOLOL 50 MG PO TABS: 100.0000 mg | ORAL_TABLET | Freq: Two times a day (BID) | ORAL | 0 refills | Status: DC

## 2020-04-29 NOTE — Progress Notes (Deleted)
Primary Physician/Referring:  Iona Hansen, NP  Patient ID: Jean Melton, female    DOB: Apr 01, 1961, 60 y.o.   MRN: [id number]  No chief complaint on file.  HPI:    Jean Melton  is a 59 y.o. female with with type II diabetes, mixed hyperlipiedmia, and hypertension. She had a syncope episode in March 2019. Nuclear stress test in April 2019 had revealed no evidence of ischemia and EF around 40% by echocardiogram at that time. She has nonischemic cardiomyopathy and chronic diastolic heart failure probably related to hypertensive heart disease.   The patient presents for annual follow up of nonischemic cardiomyopathy and chronic diastolic heart failure. She is presently doing well. She reports for the past couple weeks she has had intermittent heart burn localized to the left side of the chest. The pain is relieved with alka seltzer. She is on Prilosec. Denies dyspnea, orthopnea, leg swelling, palpitations. Denies dizziness, syncope.   ***Patient presents for 6 week follow up of hypertension and lab results. At last visit patient was started on spironolactone 25mg  daily.   Past Medical History:  Diagnosis Date  . Asthma   . B12 deficiency   . Bipolar 2 disorder (HCC)   . Depression   . Diabetes (HCC)   . GERD (gastroesophageal reflux disease)   . Heart failure (HCC)   . Hypercholesterolemia   . Hyperosmolarity due to secondary diabetes (HCC) 09/11/2011  . Hypertension   . Mixed incontinence   . Non-ischemic cardiomyopathy (HCC)   . Osteoarthritis    knees   Past Surgical History:  Procedure Laterality Date  . ABDOMINAL HYSTERECTOMY    . ABDOMINAL SURGERY    . CATARACT EXTRACTION    . CESAREAN SECTION    . CHOLECYSTECTOMY    . COLONOSCOPY  02/02/2012   Procedure: COLONOSCOPY;  Surgeon: 04/03/2012., MD;  [facility];  Service: Endoscopy;  Laterality: N/A;  . EYE SURGERY    . SKIN BIOPSY    . STOMACH SURGERY     Family History  Problem Relation Age of  Onset  . Diabetes Father   . Hypertension Father   . Heart failure Father     Social History   Tobacco Use  . Smoking status: Never Smoker  . Smokeless tobacco: Never Used  Substance Use Topics  . Alcohol use: Yes    Comment: ocass   Marital Status: Single  ROS  Review of Systems  Constitutional: Negative for malaise/fatigue.  Cardiovascular: Negative for chest pain, dyspnea on exertion, leg swelling, orthopnea, palpitations and syncope.  Gastrointestinal: Positive for heartburn.  Neurological: Negative for dizziness and light-headedness.   Objective  There were no vitals taken for this visit.  Vitals with BMI 03/19/2020 09/24/2019 09/24/2019  Height 5\' 6"  - 5\' 6"   Weight 182 lbs - 181 lbs  BMI 29.39 - 29.23  Systolic 144 130 09/26/2019  Diastolic 89 90 95  Pulse 83 74 73     Physical Exam Constitutional:      Comments: Overweight female in no acute distress  HENT:     Head: Atraumatic.  Cardiovascular:     Rate and Rhythm: Normal rate and regular rhythm.     Pulses: Intact distal pulses.     Heart sounds: Normal heart sounds. No murmur heard.  No gallop.      Comments: No JVD. No lower extremity edema.  Pulmonary:     Effort: Pulmonary effort is normal.     Breath sounds:  Normal breath sounds.  Neurological:     Mental Status: She is alert and oriented to person, place, and time.    Laboratory examination:   No results for input(s): NA, K, CL, CO2, GLUCOSE, BUN, CREATININE, CALCIUM, GFRNONAA, GFRAA in the last 8760 hours. CrCl cannot be calculated (Patient's most recent lab result is older than the maximum 21 days allowed.).  CMP Latest Ref Rng & Units 03/26/2019 10/27/2015 10/26/2015  Glucose 70 - 99 mg/dL 767(M) 094(B) 096(G)  BUN 6 - 20 mg/dL 83(M) 13 16  Creatinine 0.44 - 1.00 mg/dL 6.29(U) 7.65 4.65  Sodium 135 - 145 mmol/L 135 140 135  Potassium 3.5 - 5.1 mmol/L 3.1(L) 4.3 4.4  Chloride 98 - 111 mmol/L 99 108 102  CO2 22 - 32 mmol/L 23 27 26   Calcium 8.9 - 10.3  mg/dL 9.5 9.8  Total Protein 6.5 - 8.1 g/dL 7.8 - 8.4(H)  Total Bilirubin 0.3 - 1.2 mg/dL 03.5) - 4.6(F)  Alkaline Phos 38 - 126 U/L 47 - 53  AST 15 - 41 U/L 20 - 13(L)  ALT 0 - 44 U/L 16 - 11(L)   CBC Latest Ref Rng & Units 03/26/2019 10/27/2015 10/26/2015  WBC 4.0 - 10.5 K/uL 15.1(H) - -  Hemoglobin 12.0 - 15.0 g/dL 10/28/2015 11.7(L) 10.9(L)  Hematocrit 36 - 46 % 37.8 37.0 33.7(L)  Platelets 150 - 400 K/uL 368 - -    Lipid Panel No results for input(s): CHOL, TRIG, LDLCALC, VLDL, HDL, CHOLHDL, LDLDIRECT in the last 8760 hours.  HEMOGLOBIN A1C Lab Results  Component Value Date   HGBA1C 16.3 (H) 09/09/2011   MPG 421 (H) 09/09/2011   TSH No results for input(s): TSH in the last 8760 hours.  External labs:   AIC 5.9% 01/22/2020  CMP 10/17/2019: Potassium 3.6, BUN 14, Creatinine 0.97, glucose 121  CBC 07/18/2019: Hemoglobin 11.5 Hematocrit 34.9   Lipid Profile 07/18/2019: Total cholesterol 180, LDL 98, HDL 64, Triglycerides 91  Medications and allergies   Allergies  Allergen Reactions  . Ace Inhibitors     cough  . Pollen Extract Other (See Comments)  . Sulfamethoxazole-Trimethoprim Other (See Comments)    Hospital thinks that this is what caused the the GI bleed Hospital thinks that this is what caused the the GI bleed      Outpatient Medications Prior to Visit  Medication Sig Dispense Refill  . albuterol (PROVENTIL HFA) 108 (90 BASE) MCG/ACT inhaler Inhale 2 puffs into the lungs every 4 (four) hours as needed for wheezing.     . ARIPiprazole (ABILIFY) 10 MG tablet Take 10 mg by mouth daily.    07/20/2019 aspirin 81 MG tablet Take 81 mg by mouth daily.    Marland Kitchen atenolol (TENORMIN) 100 MG tablet Take 1 tablet (100 mg total) by mouth 2 (two) times daily. 180 tablet 3  . atenolol (TENORMIN) 50 MG tablet Take 2 tablets (100 mg total) by mouth 2 (two) times daily. 360 tablet 0  . cetirizine (ZYRTEC) 10 MG tablet Take 10 mg by mouth daily as needed for allergies.     . cycloSPORINE  (RESTASIS) 0.05 % ophthalmic emulsion Place 1 drop into both eyes 2 (two) times daily.    . Ferrous Sulfate (IRON) 325 (65 Fe) MG TABS Take 325 mg by mouth daily.     . fluticasone (FLONASE) 50 MCG/ACT nasal spray Place 1 spray into the nose daily as needed for allergies.     . Fluticasone-Salmeterol (ADVAIR DISKUS) 250-50 MCG/DOSE AEPB Inhale  1 puff into the lungs 2 (two) times daily.    Marland Kitchen glucose blood test strip USE TO CHECK BLOOD SUGAR TWO TIMES DAILY    . Lancets (ACCU-CHEK MULTICLIX) lancets 1 each by Misc.(Non-Drug; Combo Route) route daily.    Marland Kitchen LORazepam (ATIVAN) 1 MG tablet Take 1-2 mg by mouth 2 (two) times daily as needed.    Marland Kitchen losartan-hydrochlorothiazide (HYZAAR) 100-25 MG tablet Take 1 tablet by mouth once daily 90 tablet 1  . methocarbamol (ROBAXIN) 500 MG tablet Take 500 mg by mouth every 8 (eight) hours as needed.    . Multiple Vitamin (MULTIVITAMIN) capsule Take 1 capsule by mouth daily.    Marland Kitchen omeprazole (PRILOSEC) 20 MG capsule Take 2 capsules (40 mg total) by mouth daily. (Patient taking differently: Take 20 mg by mouth daily. ) 60 capsule 1  . rosuvastatin (CRESTOR) 10 MG tablet Take 10 mg by mouth at bedtime.     . sertraline (ZOLOFT) 50 MG tablet Take 50 mg by mouth at bedtime.    . sitaGLIPtin-metformin (JANUMET) 50-1000 MG tablet Take 1 tablet by mouth 2 (two) times daily with a meal.     . solifenacin (VESICARE) 5 MG tablet Take 5 mg by mouth daily.    Marland Kitchen spironolactone (ALDACTONE) 25 MG tablet Take 1 tablet (25 mg total) by mouth daily. 90 tablet 1   No facility-administered medications prior to visit.    Radiology:   No results found.  Cardiac Studies:   Exercise myoview stress 10/06/2017:  1. The patient performed treadmill exercise using a Bruce protocol, completing 6 minutes. The patient completed an estimated workload of 7.05 METS, reaching 99% of the maximum predicted heart rate. Normal hemodynamic response. Fair exercise capacity. No stress symptoms  reported. No ischemic changes seen on stress electrocardiogram. 2. The overall quality of the study is excellent. There is no evidence of abnormal lung activity. Stress and rest SPECT images demonstrate homogeneous tracer distribution throughout the myocardium. Gated SPECT imaging reveals normal myocardial thickening and wall motion. The left ventricular ejection fraction was normal (50%).   3. Low risk study.  Holter Monitor 24 hours 11/09/2017: No symptoms reported. Predominant rhythm is sinus tachycardia. Patient had 18 hours of sinus tachycardia, maximum heart rate 152 bpm. Average heart rate 106 bpm. Occasional PVCs.  Echocardiogram 03/12/2020 Normal LV systolic function with visual EF 50-55%. Left ventricle cavity  is normal in size. Moderate left ventricular hypertrophy. Normal global wall motion. Doppler evidence of grade II diastolic dysfunction,  indeterminate LAP.  Left atrial cavity is mildly dilated.  Moderate mitral valve leaflet thickening. Normal mitral valve leaflet mobility. No evidence of mitral stenosis. Mild (Grade I) mitral regurgitation.  Mild tricuspid regurgitation.  Mild pulmonic regurgitation.  Insignificant pericardial effusion.  Compared to prior study 11/15/2018: LVEF is improved from 40-45% to 50-55%, Grade 1 to Grade 2 DD, no significant change in valvular heart disease.   EKG:  ***  EKG 03/19/2020: Normal sinus rhythm at rate of 76 bpm, normal axis.  Poor R wave progression, cannot exclude anteroseptal infarct old.  Nonspecific T abnormality, cannot exclude anterior ischemia.  No significant change from 03/26/2019.  Assessment     ICD-10-CM   1. Essential hypertension  I10      There are no discontinued medications.  No orders of the defined types were placed in this encounter.   Recommendations:   SHYLIN KEIZER is a 59 y.o. with type II diabetes, mixed hyperlipiedmia, and hypertension. In 2019 after a syncopal episode  she was diagnosed with  nonischemic cardiomyopathy and chronic systolic and diastolic heart failure. Systolic heart failure is no longer present. The patient presents today for annual follow up.   Overall, she is feeling well compared with last year and is asymptomatic. No clinical evidence of decompensated heart failure. I reviewed her echocardiogram and LVEF is improved to from 40-45% to 50-55% however diastolic dysfunction worsened. There was no significant change in valvular disease.   Her blood pressure is elevated. I will start her on Spironolactone 25 mg daily for better blood pressure control. We will check a BMP in 10 days after starting the medication. I will check a BNP at that time as well.  External labs were reviewed. Lipids are controlled, diabetes is well controlled. Renal function is normal.  We discussed weight loss. She has been making dietary changes. The patient states she tries to walk for exercise, but has difficulty because of "weak ankles" for which she sees sports medicine. She is encouraged to aim for 30 minutes of exercise daily. I will see her back in 6 weeks for follow up of hypertension.  ***NO BMP/BNP done?

## 2020-04-30 ENCOUNTER — Ambulatory Visit: Payer: Medicaid Other | Admitting: Student

## 2020-04-30 DIAGNOSIS — I10 Essential (primary) hypertension: Secondary | ICD-10-CM

## 2020-05-09 ENCOUNTER — Emergency Department (HOSPITAL_COMMUNITY): Payer: Medicaid Other

## 2020-05-09 ENCOUNTER — Emergency Department (HOSPITAL_COMMUNITY)
Admission: EM | Admit: 2020-05-09 | Discharge: 2020-05-09 | Disposition: A | Discharge status: Home / Self Care | Payer: Medicaid Other | Attending: Emergency Medicine | Admitting: Emergency Medicine

## 2020-05-09 ENCOUNTER — Encounter (HOSPITAL_COMMUNITY): Payer: Self-pay | Admitting: Emergency Medicine

## 2020-05-09 DIAGNOSIS — E119 Type 2 diabetes mellitus without complications: Secondary | ICD-10-CM | POA: Diagnosis not present

## 2020-05-09 DIAGNOSIS — W19XXXA Unspecified fall, initial encounter: Secondary | ICD-10-CM

## 2020-05-09 DIAGNOSIS — S8991XA Unspecified injury of right lower leg, initial encounter: Secondary | ICD-10-CM | POA: Diagnosis present

## 2020-05-09 DIAGNOSIS — S82121A Displaced fracture of lateral condyle of right tibia, initial encounter for closed fracture: Secondary | ICD-10-CM | POA: Diagnosis not present

## 2020-05-09 DIAGNOSIS — Z7951 Long term (current) use of inhaled steroids: Secondary | ICD-10-CM | POA: Insufficient documentation

## 2020-05-09 DIAGNOSIS — J45909 Unspecified asthma, uncomplicated: Secondary | ICD-10-CM | POA: Diagnosis not present

## 2020-05-09 DIAGNOSIS — Z7982 Long term (current) use of aspirin: Secondary | ICD-10-CM | POA: Diagnosis not present

## 2020-05-09 DIAGNOSIS — W010XXA Fall on same level from slipping, tripping and stumbling without subsequent striking against object, initial encounter: Secondary | ICD-10-CM | POA: Insufficient documentation

## 2020-05-09 DIAGNOSIS — S82141A Displaced bicondylar fracture of right tibia, initial encounter for closed fracture: Secondary | ICD-10-CM

## 2020-05-09 DIAGNOSIS — I509 Heart failure, unspecified: Secondary | ICD-10-CM | POA: Diagnosis not present

## 2020-05-09 DIAGNOSIS — Y92009 Unspecified place in unspecified non-institutional (private) residence as the place of occurrence of the external cause: Secondary | ICD-10-CM | POA: Diagnosis not present

## 2020-05-09 DIAGNOSIS — Z79899 Other long term (current) drug therapy: Secondary | ICD-10-CM | POA: Insufficient documentation

## 2020-05-09 DIAGNOSIS — I110 Hypertensive heart disease with heart failure: Secondary | ICD-10-CM | POA: Insufficient documentation

## 2020-05-09 MED ORDER — HYDROCODONE-ACETAMINOPHEN 5-325 MG PO TABS
1.0000 | ORAL_TABLET | Freq: Four times a day (QID) | ORAL | 0 refills | Status: DC | PRN
Start: 2020-05-09 — End: 2020-05-15

## 2020-05-09 NOTE — Discharge Instructions (Signed)
As discussed, you have been diagnosed with a tibial plateau fracture, or fracture of the inner parts of your knee.  Please be sure to call our orthopedic colleague, Dr. Ophelia Charter on Monday for an appointment on Tuesday.  Use your knee immobilizer as much as possible, though you may remove it for hygiene, while keeping the leg straight, and not bearing weight.  Return here for concerning changes in your condition.

## 2020-05-09 NOTE — ED Triage Notes (Signed)
Pt arrives via EMS, from home. EMS reports she slipped over water at home. Hit right knee and is having right leg pain. EMS reports "clicking" over right knee. PT did not hit head or lose consciousness. PT is not on blood thinners. EMS reports patient had 2 or 3 drinks this morning.

## 2020-05-09 NOTE — ED Provider Notes (Signed)
[facility]-EMERGENCY DEPT Provider Note   CSN: [id number] Arrival date & time: 05/09/20  1349     History Chief Complaint  Patient presents with  . Fall    Jean Melton is a 59 y.o. female.  HPI    Patient presents after mechanical fall with pain in her right knee, inability to bear weight. Patient acknowledges multiple medical issues, but states that she has been doing generally well.  She recently painted the ceiling in her kitchen, and notes that some paint or cleaning fluids on the floor.   Just prior to ED arrival, she slipped on that material. Since that time she has had pain persistently and an inability to bear weight.    Patient arrives via EMS and an additional history is obtained by those individuals.  Late note that the patient acknowledged drinking alcohol earlier in the day prior to the fall.  They state that she was awake and alert throughout transport, without other complaints.  Past Medical History:  Diagnosis Date  . Asthma   . B12 deficiency   . Bipolar 2 disorder (HCC)   . Depression   . Diabetes (HCC)   . GERD (gastroesophageal reflux disease)   . Heart failure (HCC)   . Hypercholesterolemia   . Hyperosmolarity due to secondary diabetes (HCC) 09/11/2011  . Hypertension   . Mixed incontinence   . Non-ischemic cardiomyopathy (HCC)   . Osteoarthritis    knees    Patient Active Problem List   Diagnosis Date Noted  . Non-ischemic cardiomyopathy (HCC) 10/06/2017  . Bipolar 2 disorder (HCC) 07/18/2017  . Arthritis 07/18/2017  . Iliotibial band syndrome of right side 05/05/2017  . Patellofemoral pain syndrome of both knees 10/28/2016  . Hematochezia   . Esophageal reflux   . Hypercholesterolemia   . Controlled diabetes mellitus type II without complication (HCC)   . Chronic pain of both ankles 07/29/2015  . Mixed incontinence 10/16/2014  . 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 Lichen planus 06/24/2014  . Rash 03/21/2014  .  Left elbow pain 10/08/2013  . Hyponatremia 09/09/2011  . Hyperkalemia 09/09/2011  . HTN (hypertension) 09/09/2011  . Dehydration 09/09/2011  . GERD (gastroesophageal reflux disease) 09/09/2011  . Asthma 09/09/2011    Past Surgical History:  Procedure Laterality Date  . ABDOMINAL HYSTERECTOMY    . ABDOMINAL SURGERY    . CATARACT EXTRACTION    . CESAREAN SECTION    . CHOLECYSTECTOMY    . COLONOSCOPY  02/02/2012   Procedure: COLONOSCOPY;  Surgeon: Vertell Novak., MD;  Location: [facility];  Service: Endoscopy;  Laterality: N/A;  . EYE SURGERY    . SKIN BIOPSY    . STOMACH SURGERY       OB History    Gravida  4   Para  4   Term  4   Preterm      AB      Living  5     SAB      TAB      Ectopic      Multiple  1   Live Births              Family History  Problem Relation Age of Onset  . Diabetes Father   . Hypertension Father   . Heart failure Father     Social History   Tobacco Use  . Smoking status: Never Smoker  . Smokeless tobacco: Never Used  Vaping Use  . Vaping Use:  Never used  Substance Use Topics  . Alcohol use: Yes    Comment: ocass  . Drug use: No    Home Medications Prior to Admission medications   Medication Sig Start Date End Date Taking? Authorizing Provider  albuterol (PROVENTIL HFA) 108 (90 BASE) MCG/ACT inhaler Inhale 2 puffs into the lungs every 4 (four) hours as needed for wheezing.  05/11/15   [provider]  ARIPiprazole (ABILIFY) 10 MG tablet Take 10 mg by mouth daily. 03/12/20   [provider]  aspirin 81 MG tablet Take 81 mg by mouth daily.    [provider]  atenolol (TENORMIN) 100 MG tablet Take 1 tablet (100 mg total) by mouth 2 (two) times daily. 12/07/18 03/25/28  Yates Decamp, MD  atenolol (TENORMIN) 50 MG tablet Take 2 tablets (100 mg total) by mouth 2 (two) times daily. 04/13/20   Yates Decamp, MD  cetirizine (ZYRTEC) 10 MG tablet Take 10 mg by mouth daily as needed for allergies.   05/11/15   [provider]  cycloSPORINE (RESTASIS) 0.05 % ophthalmic emulsion Place 1 drop into both eyes 2 (two) times daily. 05/12/15   [provider]  Ferrous Sulfate (IRON) 325 (65 Fe) MG TABS Take 325 mg by mouth daily.     [provider]  fluticasone (FLONASE) 50 MCG/ACT nasal spray Place 1 spray into the nose daily as needed for allergies.  05/18/18   [provider]  Fluticasone-Salmeterol (ADVAIR DISKUS) 250-50 MCG/DOSE AEPB Inhale 1 puff into the lungs 2 (two) times daily. 05/11/15   [provider]  glucose blood test strip USE TO CHECK BLOOD SUGAR TWO TIMES DAILY 02/22/17   [provider]  Lancets (ACCU-CHEK MULTICLIX) lancets 1 each by Misc.(Non-Drug; Combo Route) route daily. 08/03/17   [provider]  LORazepam (ATIVAN) 1 MG tablet Take 1-2 mg by mouth 2 (two) times daily as needed. 09/27/19   [provider]  losartan-hydrochlorothiazide Mauri Reading) 100-25 MG tablet Take 1 tablet by mouth once daily 07/16/19   Yates Decamp, MD  methocarbamol (ROBAXIN) 500 MG tablet Take 500 mg by mouth every 8 (eight) hours as needed. 11/15/19   [provider]  Multiple Vitamin (MULTIVITAMIN) capsule Take 1 capsule by mouth daily.    [provider]  omeprazole (PRILOSEC) 20 MG capsule Take 2 capsules (40 mg total) by mouth daily. Patient taking differently: Take 20 mg by mouth daily.  10/27/15   Vassie Loll, MD  rosuvastatin (CRESTOR) 10 MG tablet Take 10 mg by mouth at bedtime.  06/25/18   [provider]  sertraline (ZOLOFT) 50 MG tablet Take 50 mg by mouth at bedtime. 03/14/19   [provider]  sitaGLIPtin-metformin (JANUMET) 50-1000 MG tablet Take 1 tablet by mouth 2 (two) times daily with a meal.     [provider]  solifenacin (VESICARE) 5 MG tablet Take 5 mg by mouth daily.    [provider]  spironolactone (ALDACTONE) 25 MG tablet Take 1 tablet (25 mg total) by mouth  daily. 03/19/20   Yates Decamp, MD  buPROPion (WELLBUTRIN XL) 150 MG 24 hr tablet Take 150 mg by mouth daily.    09/09/11  [provider]    Allergies    Ace inhibitors, Pollen extract, and Sulfamethoxazole-trimethoprim  Review of Systems   Review of Systems  Constitutional:       Per HPI, otherwise negative  HENT:       Per HPI, otherwise negative  Respiratory:  Per HPI, otherwise negative  Cardiovascular:       Per HPI, otherwise negative  Gastrointestinal: Negative for vomiting.  Endocrine:       Negative aside from HPI  Genitourinary:       Neg aside from HPI   Musculoskeletal:       Per HPI, otherwise negative  Skin: Negative.   Neurological: Negative for syncope.    Physical Exam Updated Vital Signs BP 118/84   Pulse 77   Temp 98.1 F (36.7 C)   Resp 18   SpO2 96%   Physical Exam Vitals and nursing note reviewed.  Constitutional:      General: She is not in acute distress.    Appearance: She is well-developed.  HENT:     Head: Normocephalic and atraumatic.  Eyes:     Conjunctiva/sclera: Conjunctivae normal.  Cardiovascular:     Rate and Rhythm: Normal rate and regular rhythm.  Pulmonary:     Effort: Pulmonary effort is normal. No respiratory distress.     Breath sounds: Normal breath sounds. No stridor.  Abdominal:     General: There is no distension.  Musculoskeletal:       Arms:       Legs:  Skin:    General: Skin is warm and dry.  Neurological:     Mental Status: She is alert and oriented to person, place, and time.     Cranial Nerves: No cranial nerve deficit.      ED Results / Procedures / Treatments    Radiology CT Knee Right Wo Contrast  Result Date: 05/09/2020 CLINICAL DATA:  Right knee pain after fall. EXAM: CT OF THE RIGHT KNEE WITHOUT CONTRAST TECHNIQUE: Multidetector CT imaging of the right knee was performed according to the standard protocol. Multiplanar CT image reconstructions were also generated. COMPARISON:   Right knee x-rays from same day. FINDINGS: Bones/Joint/Cartilage Acute mildly depressed fracture of the posterior lateral tibial plateau (series 8, image 26). Small acute appearing avulsion fracture fragments of the anterior tibial spine. No dislocation. Joint spaces are preserved. Small lipohemarthrosis. Ligaments Ligaments are suboptimally evaluated by CT. Muscles and Tendons Grossly intact. Soft tissue No fluid collection or hematoma.  No soft tissue mass. IMPRESSION: 1. Acute mildly depressed fracture of the posterior lateral tibial plateau. 2. Acute appearing avulsion fracture of the anterior tibial spine. 3. Small lipohemarthrosis. Electronically Signed   By: Obie Dredge M.D.   On: 05/09/2020 15:21   DG Knee Complete 4 Views Right  Result Date: 05/09/2020 CLINICAL DATA:  Pt arrives via EMS from home after she slipped over water at home. Pt stated she hit her right knee and is having right leg weakness when she tries to stand. EXAM: RIGHT KNEE - COMPLETE 4+ VIEW COMPARISON:  None. FINDINGS: No evidence of fracture, dislocation, or joint effusion. Mild degenerative changes. No aggressive osseous lesion. Soft tissues are unremarkable. IMPRESSION: No acute osseous abnormality in the right knee. Electronically Signed   By: Emmaline Kluver M.D.   On: 05/09/2020 14:43    Procedures Procedures (including critical care time)  ED Course  I have reviewed the triage vital signs and the nursing notes.  Pertinent labs & imaging results that were available during my care of the patient were reviewed by me and considered in my medical decision making (see chart for details).   Update:, After the initial x-ray did not demonstrate acute fracture, but with suspicion for possible tibial plateau fracture, the patient CT scan performed.  Update: I  reviewed the CT images, agree with interpretation, to small fracture fragments within the intra-articular area, consistent with tibial plateau fracture.  4:13  PM I discussed the patient's case with our orthopedic colleague, Dr. Ophelia Charter.  Patient has had application of a knee immobilizer, by the orthopedic technician and myself.  This was well-tolerated.  She was provided a walker. Patient will follow up in the coming days in the orthopedic clinic. MDM Rules/Calculators/A&P MDM Number of Diagnoses or Management Options Fall, initial encounter: new, needed workup Tibial plateau fracture, right, closed, initial encounter: new, needed workup   Amount and/or Complexity of Data Reviewed Tests in the radiology section of CPT: reviewed Discussion of test results with the performing providers: yes Obtain history from someone other than the patient: yes Discuss the patient with other providers: yes Independent visualization of images, tracings, or specimens: yes  Risk of Complications, Morbidity, and/or Mortality Presenting problems: high Diagnostic procedures: high Management options: high  Critical Care Total time providing critical care: < 30 minutes  Patient Progress Patient progress: stable  Final Clinical Impression(s) / ED Diagnoses Final diagnoses:  Fall, initial encounter  Tibial plateau fracture, right, closed, initial encounter    Rx / DC Orders ED Discharge Orders         Ordered    HYDROcodone-acetaminophen (NORCO/VICODIN) 5-325 MG tablet  Every 6 hours PRN        05/09/20 1611           Gerhard Munch, MD 05/09/20 1616

## 2020-05-09 NOTE — ED Notes (Signed)
Pt was able to find a ride to come pick her up. [facility] transport cancelled.

## 2020-05-12 ENCOUNTER — Ambulatory Visit: Payer: Medicaid Other | Admitting: Student

## 2020-05-14 ENCOUNTER — Encounter: Payer: Self-pay | Admitting: Surgery

## 2020-05-14 ENCOUNTER — Other Ambulatory Visit: Payer: Self-pay

## 2020-05-14 ENCOUNTER — Ambulatory Visit (INDEPENDENT_AMBULATORY_CARE_PROVIDER_SITE_OTHER): Payer: Medicaid Other | Admitting: Surgery

## 2020-05-14 DIAGNOSIS — S82191A Other fracture of upper end of right tibia, initial encounter for closed fracture: Secondary | ICD-10-CM | POA: Diagnosis not present

## 2020-05-14 NOTE — Progress Notes (Signed)
Office Visit Note   Patient: Jean Melton           Date of Birth: 1961/03/18           MRN: [id number] Visit Date: 05/14/2020              Requested by: Iona Hansen, NP [address] [place],  [place] [address] PCP: Iona Hansen, NP   Assessment & Plan: Visit Diagnoses:  1. Other closed fracture of proximal end of right tibia, initial encounter     Plan: Stressed the patient that she must use her knee immobilizer and also instructed to be nonweightbearing right lower extremity.  Stressed her the importance of being compliant. Dr. Ophelia Charter did review the CT scan. He is recommending at this time conservative management. Follow-up with Dr. Ophelia Charter in 2 weeks for recheck. Continue using walker. Repeat right knee x-rays at next visit.  Follow-Up Instructions: Return in about 2 weeks (around 05/28/2020) for With Dr. Ophelia Charter for recheck right knee..   Orders:  No orders of the defined types were placed in this encounter.  No orders of the defined types were placed in this encounter.     Procedures: No procedures performed   Clinical Data: No additional findings.   Subjective: Chief Complaint  Patient presents with  . Right Knee - Pain, Fracture, Injury    S/p fall on 05/09/20    HPI 59 year old black female comes in today with right tibial plateau fracture.  Patient states in May 09, 2020 she slipped on some water in her kitchen when she suffered the injury.  She was seen in the ED and CT scan showed:  EXAM: CT OF THE RIGHT KNEE WITHOUT CONTRAST  TECHNIQUE: Multidetector CT imaging of the right knee was performed according to the standard protocol. Multiplanar CT image reconstructions were also generated.  COMPARISON:  Right knee x-rays from same day.  FINDINGS: Bones/Joint/Cartilage  Acute mildly depressed fracture of the posterior lateral tibial plateau (series 8, image 26). Small acute appearing avulsion fracture fragments of the anterior  tibial spine. No dislocation. Joint spaces are preserved. Small lipohemarthrosis.  Ligaments  Ligaments are suboptimally evaluated by CT.  Muscles and Tendons Grossly intact.  Soft tissue No fluid collection or hematoma.  No soft tissue mass.  IMPRESSION: 1. Acute mildly depressed fracture of the posterior lateral tibial plateau. 2. Acute appearing avulsion fracture of the anterior tibial spine. 3. Small lipohemarthrosis.   Patient was put in a knee immobilizer and given a walker.  States that she has not been using the knee immobilizer because she cannot make it stay on correctly.  She is also been weightbearing on the right lower extremity.   Review of Systems No current cardiac pulmonary GI GU issues  Objective: Vital Signs: There were no vitals taken for this visit.  Physical Exam HENT:     Head: Normocephalic.  Pulmonary:     Effort: No respiratory distress.  Musculoskeletal:     Comments: Knee range of motion 0 to 90. She is marked tender over the lateral proximal tibia. Calf nontender. Does have some knee swelling with small effusion. Neurovascular intact.  Neurological:     General: No focal deficit present.     Mental Status: She is alert and oriented to person, place, and time.     Ortho Exam  Specialty Comments:  No specialty comments available.  Imaging: No results found.   PMFS History: Patient Active Problem List  Diagnosis Date Noted  . Non-ischemic cardiomyopathy (HCC) 10/06/2017  . Bipolar 2 disorder (HCC) 07/18/2017  . Arthritis 07/18/2017  . Iliotibial band syndrome of right side 05/05/2017  . Patellofemoral pain syndrome of both knees 10/28/2016  . Hematochezia   . Esophageal reflux   . Hypercholesterolemia   . Controlled diabetes mellitus type II without complication (HCC)   . Chronic pain of both ankles 07/29/2015  . Mixed incontinence 10/16/2014  . 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 Lichen planus 06/24/2014  . Rash  03/21/2014  . Left elbow pain 10/08/2013  . Hyponatremia 09/09/2011  . Hyperkalemia 09/09/2011  . HTN (hypertension) 09/09/2011  . Dehydration 09/09/2011  . GERD (gastroesophageal reflux disease) 09/09/2011  . Asthma 09/09/2011   Past Medical History:  Diagnosis Date  . Asthma   . B12 deficiency   . Bipolar 2 disorder (HCC)   . Depression   . Diabetes (HCC)   . GERD (gastroesophageal reflux disease)   . Heart failure (HCC)   . Hypercholesterolemia   . Hyperosmolarity due to secondary diabetes (HCC) 09/11/2011  . Hypertension   . Mixed incontinence   . Non-ischemic cardiomyopathy (HCC)   . Osteoarthritis    knees    Family History  Problem Relation Age of Onset  . Diabetes Father   . Hypertension Father   . Heart failure Father     Past Surgical History:  Procedure Laterality Date  . ABDOMINAL HYSTERECTOMY    . ABDOMINAL SURGERY    . CATARACT EXTRACTION    . CESAREAN SECTION    . CHOLECYSTECTOMY    . COLONOSCOPY  02/02/2012   Procedure: COLONOSCOPY;  Surgeon: Vertell Novak., MD;  Location: [facility];  Service: Endoscopy;  Laterality: N/A;  . EYE SURGERY    . SKIN BIOPSY    . STOMACH SURGERY     Social History   Occupational History    Comment: disabled  Tobacco Use  . Smoking status: Never Smoker  . Smokeless tobacco: Never Used  Vaping Use  . Vaping Use: Never used  Substance and Sexual Activity  . Alcohol use: Yes    Comment: ocass  . Drug use: No  . Sexual activity: Yes    Birth control/protection: Surgical

## 2020-05-15 ENCOUNTER — Telehealth: Payer: Self-pay | Admitting: Surgery

## 2020-05-15 MED ORDER — HYDROCODONE-ACETAMINOPHEN 5-325 MG PO TABS: 1.0000 | ORAL_TABLET | Freq: Three times a day (TID) | ORAL | 0 refills | Status: DC | PRN

## 2020-05-15 NOTE — Telephone Encounter (Signed)
Pt called regarding apt yesterday.  James prescribed her some medication and [facility] on [address] are telling her they haven't received anything.

## 2020-05-15 NOTE — Addendum Note (Signed)
Addended by: Naida Sleight on: 05/15/2020 05:04 PM   Modules accepted: Orders

## 2020-05-15 NOTE — Telephone Encounter (Signed)
I did not see where anything was rx'd ---please advise

## 2020-05-18 ENCOUNTER — Telehealth: Payer: Self-pay | Admitting: Orthopaedic Surgery

## 2020-05-18 NOTE — Telephone Encounter (Signed)
I called and advised patient that her insurance has approved her medication.

## 2020-05-19 ENCOUNTER — Other Ambulatory Visit: Payer: Self-pay | Admitting: Cardiology

## 2020-05-19 DIAGNOSIS — I10 Essential (primary) hypertension: Secondary | ICD-10-CM

## 2020-05-19 DIAGNOSIS — I5042 Chronic combined systolic (congestive) and diastolic (congestive) heart failure: Secondary | ICD-10-CM

## 2020-05-27 ENCOUNTER — Ambulatory Visit: Payer: Self-pay

## 2020-05-27 ENCOUNTER — Encounter: Payer: Self-pay | Admitting: Orthopaedic Surgery

## 2020-05-27 ENCOUNTER — Ambulatory Visit (INDEPENDENT_AMBULATORY_CARE_PROVIDER_SITE_OTHER): Payer: Medicaid Other | Admitting: Orthopaedic Surgery

## 2020-05-27 VITALS — Ht 66.0 in | Wt 182.0 lb

## 2020-05-27 DIAGNOSIS — M25562 Pain in left knee: Secondary | ICD-10-CM | POA: Diagnosis not present

## 2020-05-27 DIAGNOSIS — S82191A Other fracture of upper end of right tibia, initial encounter for closed fracture: Secondary | ICD-10-CM | POA: Diagnosis not present

## 2020-05-27 DIAGNOSIS — S8002XA Contusion of left knee, initial encounter: Secondary | ICD-10-CM | POA: Insufficient documentation

## 2020-05-27 DIAGNOSIS — S8002XD Contusion of left knee, subsequent encounter: Secondary | ICD-10-CM

## 2020-05-27 NOTE — Progress Notes (Signed)
Office Visit Note   Patient: Jean Melton           Date of Birth: 26-May-1961           MRN: [id number] Visit Date: 05/27/2020              Requested by: Iona Hansen, NP [address] [place],  [place] [address] PCP: Iona Hansen, NP   Assessment & Plan: Visit Diagnoses:  1. Other closed fracture of proximal end of right tibia, initial encounter   2. Acute pain of left knee     Plan: Knee immobilizer just distal when she applies it stays up. She can continue use of her walker. Plan to recheck her in 3 weeks. She can remove the knee immobilizer at night when she sleeps. She can gradually progress with weightbearing on her right leg as she has been doing. No x-ray needed on return in 3 weeks.  Follow-Up Instructions: No follow-ups on file.   Orders:  Orders Placed This Encounter  Procedures  . XR Knee 1-2 Views Right  . XR Knee 1-2 Views Left   No orders of the defined types were placed in this encounter.     Procedures: No procedures performed   Clinical Data: No additional findings.   Subjective: Chief Complaint  Patient presents with  . Right Knee - Follow-up, Fracture    Fall 05/09/2020    HPI 59 year old female returns post fall 05/09/2020 with far posterior right lateral tibial plateau dye punch fracture with only displacement or depression of 1 to millimeter. This was visualized on CT not well seen on plain x-ray. She has been using her walker. Knee immobilizer was adjusted since it has been sliding down she had to pull on it to keep it up. She has had increased pain with her left knee which she injured when she fell and landed on it and points to the medial femoral condyle where she is sore and requested x-rays of her left knee today. Right knee has not been swelling she is using her walker religiously.  Review of Systems updated unchanged.   Objective: Vital Signs: Ht 5\' 6"  (1.676 m)   Wt 182 lb (82.6 kg)   BMI 29.38 kg/m   Physical  Exam Constitutional:      Appearance: She is well-developed.  HENT:     Head: Normocephalic.     Right Ear: External ear normal.     Left Ear: External ear normal.  Eyes:     Pupils: Pupils are equal, round, and reactive to light.  Neck:     Thyroid: No thyromegaly.     Trachea: No tracheal deviation.  Cardiovascular:     Rate and Rhythm: Normal rate.  Pulmonary:     Effort: Pulmonary effort is normal.  Abdominal:     Palpations: Abdomen is soft.  Skin:    General: Skin is warm and dry.  Neurological:     Mental Status: She is alert and oriented to person, place, and time.  Psychiatric:        Behavior: Behavior normal.     Ortho Exam mild tenderness over the left knee medial femoral condyle. No joint line tenderness. Collateral ligaments are stable. Hamstrings are normal. She has mild crepitus with knee extension. No effusion right knee. Ankle range of motion is normal negative logroll hips.  Specialty Comments:  No specialty comments available.  Imaging: No results found.   PMFS History: Patient Active  Problem List   Diagnosis Date Noted  . Non-ischemic cardiomyopathy (HCC) 10/06/2017  . Bipolar 2 disorder (HCC) 07/18/2017  . Arthritis 07/18/2017  . Iliotibial band syndrome of right side 05/05/2017  . Patellofemoral pain syndrome of both knees 10/28/2016  . Hematochezia   . Esophageal reflux   . Hypercholesterolemia   . Controlled diabetes mellitus type II without complication (HCC)   . Chronic pain of both ankles 07/29/2015  . Mixed incontinence 10/16/2014  . 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 Lichen planus 06/24/2014  . Rash 03/21/2014  . Left elbow pain 10/08/2013  . Hyponatremia 09/09/2011  . Hyperkalemia 09/09/2011  . HTN (hypertension) 09/09/2011  . Dehydration 09/09/2011  . GERD (gastroesophageal reflux disease) 09/09/2011  . Asthma 09/09/2011   Past Medical History:  Diagnosis Date  . Asthma   . B12 deficiency   . Bipolar 2 disorder  (HCC)   . Depression   . Diabetes (HCC)   . GERD (gastroesophageal reflux disease)   . Heart failure (HCC)   . Hypercholesterolemia   . Hyperosmolarity due to secondary diabetes (HCC) 09/11/2011  . Hypertension   . Mixed incontinence   . Non-ischemic cardiomyopathy (HCC)   . Osteoarthritis    knees    Family History  Problem Relation Age of Onset  . Diabetes Father   . Hypertension Father   . Heart failure Father     Past Surgical History:  Procedure Laterality Date  . ABDOMINAL HYSTERECTOMY    . ABDOMINAL SURGERY    . CATARACT EXTRACTION    . CESAREAN SECTION    . CHOLECYSTECTOMY    . COLONOSCOPY  02/02/2012   Procedure: COLONOSCOPY;  Surgeon: Vertell Novak., MD;  Location: [facility];  Service: Endoscopy;  Laterality: N/A;  . EYE SURGERY    . SKIN BIOPSY    . STOMACH SURGERY     Social History   Occupational History    Comment: disabled  Tobacco Use  . Smoking status: Never Smoker  . Smokeless tobacco: Never Used  Vaping Use  . Vaping Use: Never used  Substance and Sexual Activity  . Alcohol use: Yes    Comment: ocass  . Drug use: No  . Sexual activity: Yes    Birth control/protection: Surgical

## 2020-06-01 ENCOUNTER — Other Ambulatory Visit: Payer: Self-pay | Admitting: Orthopaedic Surgery

## 2020-06-01 ENCOUNTER — Telehealth: Payer: Self-pay

## 2020-06-01 MED ORDER — HYDROCODONE-ACETAMINOPHEN 5-325 MG PO TABS: 1.0000 | ORAL_TABLET | Freq: Three times a day (TID) | ORAL | 0 refills | Status: DC | PRN

## 2020-06-01 NOTE — Telephone Encounter (Signed)
patient called she is requesting rx refill for hydrocodone.  CB:[phone]

## 2020-06-01 NOTE — Telephone Encounter (Signed)
[facility], done sent in thanks

## 2020-06-01 NOTE — Telephone Encounter (Signed)
I called patient and advised. 

## 2020-06-01 NOTE — Telephone Encounter (Signed)
Please advise 

## 2020-06-17 ENCOUNTER — Ambulatory Visit (INDEPENDENT_AMBULATORY_CARE_PROVIDER_SITE_OTHER): Payer: Medicaid Other | Admitting: Orthopaedic Surgery

## 2020-06-17 ENCOUNTER — Other Ambulatory Visit: Payer: Self-pay

## 2020-06-17 ENCOUNTER — Encounter: Payer: Self-pay | Admitting: Orthopaedic Surgery

## 2020-06-17 DIAGNOSIS — S82141S Displaced bicondylar fracture of right tibia, sequela: Secondary | ICD-10-CM

## 2020-06-17 DIAGNOSIS — S82141D Displaced bicondylar fracture of right tibia, subsequent encounter for closed fracture with routine healing: Secondary | ICD-10-CM | POA: Diagnosis not present

## 2020-06-17 DIAGNOSIS — S82141A Displaced bicondylar fracture of right tibia, initial encounter for closed fracture: Secondary | ICD-10-CM | POA: Insufficient documentation

## 2020-06-17 MED ORDER — TRAMADOL HCL 50 MG PO TABS
50.0000 mg | ORAL_TABLET | Freq: Two times a day (BID) | ORAL | 0 refills | Status: DC | PRN
Start: 2020-06-17 — End: 2021-12-03

## 2020-06-17 NOTE — Progress Notes (Signed)
Office Visit Note   Patient: Jean Melton           Date of Birth: 05-Aug-1960           MRN: [id number] Visit Date: 06/17/2020              Requested by: Iona Hansen, NP [address] [place],  [place] [address] PCP: Iona Hansen, NP   Assessment & Plan: Visit Diagnoses:  1. Closed fracture of right tibial plateau, sequela     Plan: Patient can progress with weightbearing or recheck during the 4 weeks. She can work on riding a stationary bicycle working on getting some leg lifts. She can use the knee immobilizer only if needed. She will use her walker with progressive weightbearing starting with 50 to 75% of her weight and as long as pain is not bad she can progress with walking. Recheck 5 weeks. Stop Norco she requested pain medication will switch to Ultram 1 p.o. twice daily as needed 30 tablets no refills.  Follow-Up Instructions: Return in about 5 weeks (around 07/22/2020).   Orders:  No orders of the defined types were placed in this encounter.  Meds ordered this encounter  Medications  . traMADol (ULTRAM) 50 MG tablet    Sig: Take 1 tablet (50 mg total) by mouth every 12 (twelve) hours as needed.    Dispense:  30 tablet    Refill:  0      Procedures: No procedures performed   Clinical Data: No additional findings.   Subjective: Chief Complaint  Patient presents with  . Right Knee - Pain, Follow-up    Fall 05/09/2020  . Left Knee - Pain, Follow-up    Fall 05/09/2020    HPI 59 year old female returns post fall 05/09/2020 with lateral tibial plateau fracture posterior corner small area with small dye punch. She did not require surgery she is using a knee immobilizer which she can stop at this point. She can begin progressive weightbearing as tolerated with her walker and then wean herself and go to a cane in the left hand if needed. She has had some soreness in her opposite left knee which also has medial joint line narrowing on plain radiographs  and her left knee has bothered her more recently since she has been putting all her weight on her left knee and touchdown weightbearing on the right.  Review of Systems all other systems update unchanged from previous visit.   Objective: Vital Signs: BP 108/71   Pulse 60   Ht 5\' 6"  (1.676 m)   Wt 182 lb (82.6 kg)   BMI 29.38 kg/m   Physical Exam Constitutional:      Appearance: She is well-developed.  HENT:     Head: Normocephalic.     Right Ear: External ear normal.     Left Ear: External ear normal.  Eyes:     Pupils: Pupils are equal, round, and reactive to light.  Neck:     Thyroid: No thyromegaly.     Trachea: No tracheal deviation.  Cardiovascular:     Rate and Rhythm: Normal rate.  Pulmonary:     Effort: Pulmonary effort is normal.  Abdominal:     Palpations: Abdomen is soft.  Skin:    General: Skin is warm and dry.  Neurological:     Mental Status: She is alert and oriented to person, place, and time.  Psychiatric:        Mood and  Affect: Mood and affect normal.        Behavior: Behavior normal.     Ortho Exam no knee effusion she can flex to 90 degrees comes up to near full extension right knee. Sensation of foot is intact.  Specialty Comments:  No specialty comments available.  Imaging: No results found.   PMFS History: Patient Active Problem List   Diagnosis Date Noted  . Tibial plateau fracture, right 06/17/2020  . Contusion of left knee 05/27/2020  . Non-ischemic cardiomyopathy (HCC) 10/06/2017  . Bipolar 2 disorder (HCC) 07/18/2017  . Arthritis 07/18/2017  . Iliotibial band syndrome of right side 05/05/2017  . Patellofemoral pain syndrome of both knees 10/28/2016  . Hematochezia   . Esophageal reflux   . Hypercholesterolemia   . Controlled diabetes mellitus type II without complication (HCC)   . Chronic pain of both ankles 07/29/2015  . Mixed incontinence 10/16/2014  . 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 Lichen planus 06/24/2014  .  Rash 03/21/2014  . Left elbow pain 10/08/2013  . Hyponatremia 09/09/2011  . Hyperkalemia 09/09/2011  . HTN (hypertension) 09/09/2011  . Dehydration 09/09/2011  . GERD (gastroesophageal reflux disease) 09/09/2011  . Asthma 09/09/2011   Past Medical History:  Diagnosis Date  . Asthma   . B12 deficiency   . Bipolar 2 disorder (HCC)   . Depression   . Diabetes (HCC)   . GERD (gastroesophageal reflux disease)   . Heart failure (HCC)   . Hypercholesterolemia   . Hyperosmolarity due to secondary diabetes (HCC) 09/11/2011  . Hypertension   . Mixed incontinence   . Non-ischemic cardiomyopathy (HCC)   . Osteoarthritis    knees    Family History  Problem Relation Age of Onset  . Diabetes Father   . Hypertension Father   . Heart failure Father     Past Surgical History:  Procedure Laterality Date  . ABDOMINAL HYSTERECTOMY    . ABDOMINAL SURGERY    . CATARACT EXTRACTION    . CESAREAN SECTION    . CHOLECYSTECTOMY    . COLONOSCOPY  02/02/2012   Procedure: COLONOSCOPY;  Surgeon: Vertell Novak., MD;  Location: [facility];  Service: Endoscopy;  Laterality: N/A;  . EYE SURGERY    . SKIN BIOPSY    . STOMACH SURGERY     Social History   Occupational History    Comment: disabled  Tobacco Use  . Smoking status: Never Smoker  . Smokeless tobacco: Never Used  Vaping Use  . Vaping Use: Never used  Substance and Sexual Activity  . Alcohol use: Yes    Comment: ocass  . Drug use: No  . Sexual activity: Yes    Birth control/protection: Surgical

## 2020-06-18 ENCOUNTER — Telehealth: Payer: Self-pay | Admitting: Orthopaedic Surgery

## 2020-06-18 NOTE — Telephone Encounter (Signed)
Pt called stating the pharmacy didn't receive the rx of tramadol and she would like to have this resent and would like a CB to update when this has been done.  [phone]

## 2020-06-18 NOTE — Telephone Encounter (Signed)
I called and spoke with Katrina at [facility]. I did prior auth yesterday through Cover My Med[employer] which was approved. She states that they have received the information and the tramadol is ready for pick up. I called patient and advised.

## 2020-06-29 ENCOUNTER — Other Ambulatory Visit: Payer: Self-pay | Admitting: Cardiology

## 2020-06-29 DIAGNOSIS — I5032 Chronic diastolic (congestive) heart failure: Secondary | ICD-10-CM

## 2020-06-29 DIAGNOSIS — I10 Essential (primary) hypertension: Secondary | ICD-10-CM

## 2020-07-01 ENCOUNTER — Other Ambulatory Visit: Payer: Self-pay | Admitting: Cardiology

## 2020-07-01 ENCOUNTER — Other Ambulatory Visit: Payer: Self-pay | Admitting: Orthopaedic Surgery

## 2020-07-01 DIAGNOSIS — I10 Essential (primary) hypertension: Secondary | ICD-10-CM

## 2020-07-01 DIAGNOSIS — I5032 Chronic diastolic (congestive) heart failure: Secondary | ICD-10-CM

## 2020-07-01 NOTE — Telephone Encounter (Signed)
I called, no answer. Will try again

## 2020-07-01 NOTE — Telephone Encounter (Signed)
Time to change to tylenol or aleve. [facility] thanks

## 2020-07-01 NOTE — Telephone Encounter (Signed)
Please advise 

## 2020-07-06 NOTE — Progress Notes (Signed)
Primary Physician/Referring:  Berkley Harvey, NP  Patient ID: Jean Melton, female    DOB: 10/17/1960, 60 y.o.   MRN: [id number]  Chief Complaint  Patient presents with   Hypertension   Results   Follow-up   HPI:    Jean Melton  is a 60 y.o. female with with type II diabetes, mixed hyperlipiedmia, and hypertension. She had a syncope episode in March 2019. Nuclear stress test in April 2019 had revealed no evidence of ischemia and EF around 40% by echocardiogram at that time. She has nonischemic cardiomyopathy and chronic diastolic heart failure probably related to hypertensive heart disease.   Patient was last seen in our office by Dr. Einar Gip 03/19/2020. At last visit she was started on spironolactone 25 mg daily for hypertension. She now presents for follow up, she is presently feeling well and tolerating spironolactone without issue.  She does not monitor her blood pressure on a regular basis at home, however was well controlled in the office today.  Patient does note she has been experiencing right knee pain since she fell after slipping on water on her kitchen floor.  Patient fractured her left patella and is following with orthopedics for management of this.  Denies chest pain, palpitations, orthopnea, leg swelling, dizziness, syncope, near syncope.  Past Medical History:  Diagnosis Date   Asthma    B12 deficiency    Bipolar 2 disorder ([place])    Depression    Diabetes ([place])    GERD (gastroesophageal reflux disease)    Heart failure (HCC)    Hypercholesterolemia    Hyperosmolarity due to secondary diabetes ([place]) 09/11/2011   Hypertension    Mixed incontinence    Non-ischemic cardiomyopathy (HCC)    Osteoarthritis    knees   Past Surgical History:  Procedure Laterality Date   ABDOMINAL HYSTERECTOMY     ABDOMINAL SURGERY     CATARACT EXTRACTION     CESAREAN SECTION     CHOLECYSTECTOMY     COLONOSCOPY  02/02/2012   Procedure: COLONOSCOPY;  Surgeon:  Winfield Cunas., MD;  Location: [facility];  Service: Endoscopy;  Laterality: N/A;   EYE SURGERY     SKIN BIOPSY     STOMACH SURGERY     Family History  Problem Relation Age of Onset   Diabetes Father    Hypertension Father    Heart failure Father     Social History   Tobacco Use   Smoking status: Never Smoker   Smokeless tobacco: Never Used  Substance Use Topics   Alcohol use: Yes    Comment: ocass   Marital Status: Single  ROS  Review of Systems  Constitutional: Negative for malaise/fatigue and weight gain.  Cardiovascular: Negative for chest pain, claudication, dyspnea on exertion, leg swelling, near-syncope, orthopnea, palpitations, paroxysmal nocturnal dyspnea and syncope.  Respiratory: Negative for shortness of breath.   Hematologic/Lymphatic: Does not bruise/bleed easily.  Musculoskeletal: Positive for joint pain (bilateral knee).  Gastrointestinal: Negative for heartburn and melena.  Neurological: Negative for dizziness, light-headedness and weakness.   Objective  Blood pressure 108/74, pulse 68, height 5\' 6"  (1.676 m), weight 174 lb (78.9 kg), SpO2 98 %.  Vitals with BMI 07/07/2020 06/17/2020 05/27/2020  Height 5\' 6"  5\' 6"  5\' 6"   Weight 174 lbs 182 lbs 182 lbs  BMI 28.1 54.62 70.35  Systolic 009 381 -  Diastolic 74 71 -  Pulse 68 60 -     Physical Exam Vitals reviewed.  Constitutional:  Comments: Overweight female in no acute distress  HENT:     Head: Normocephalic and atraumatic.  Cardiovascular:     Rate and Rhythm: Normal rate and regular rhythm.     Pulses: Intact distal pulses.     Heart sounds: Normal heart sounds, S1 normal and S2 normal. No murmur heard. No gallop.      Comments: No JVD. No lower extremity edema.  Pulmonary:     Effort: Pulmonary effort is normal. No respiratory distress.     Breath sounds: Normal breath sounds. No wheezing, rhonchi or rales.  Musculoskeletal:     Right lower leg: No edema.     Left lower  leg: No edema.  Neurological:     Mental Status: She is alert and oriented to person, place, and time.    Laboratory examination:   No results for input(s): NA, K, CL, CO2, GLUCOSE, BUN, CREATININE, CALCIUM, GFRNONAA, GFRAA in the last 8760 hours. CrCl cannot be calculated (Patient's most recent lab result is older than the maximum 21 days allowed.).  CMP Latest Ref Rng & Units 03/26/2019 10/27/2015 10/26/2015  Glucose 70 - 99 mg/dL 347(Q) 259(D) 638(V)  BUN 6 - 20 mg/dL 56(E) 13 16  Creatinine 0.44 - 1.00 mg/dL 3.32(R) 5.18 8.41  Sodium 135 - 145 mmol/L 135 140 135  Potassium 3.5 - 5.1 mmol/L 3.1(L) 4.3 4.4  Chloride 98 - 111 mmol/L 99 108 102  CO2 22 - 32 mmol/L 23 27 26   Calcium 8.9 - 10.3 mg/dL 9.5 9.8  Total Protein 6.5 - 8.1 g/dL 7.8 - 8.4(H)  Total Bilirubin 0.3 - 1.2 mg/dL 66.0) - 6.3(K)  Alkaline Phos 38 - 126 U/L 47 - 53  AST 15 - 41 U/L 20 - 13(L)  ALT 0 - 44 U/L 16 - 11(L)   CBC Latest Ref Rng & Units 03/26/2019 10/27/2015 10/26/2015  WBC 4.0 - 10.5 K/uL 15.1(H) - -  Hemoglobin 12.0 - 15.0 g/dL 10/28/2015 11.7(L) 10.9(L)  Hematocrit 36.0 - 46.0 % 37.8 37.0 33.7(L)  Platelets 150 - 400 K/uL 368 - -    Lipid Panel No results for input(s): CHOL, TRIG, LDLCALC, VLDL, HDL, CHOLHDL, LDLDIRECT in the last 8760 hours.  HEMOGLOBIN A1C Lab Results  Component Value Date   HGBA1C 16.3 (H) 09/09/2011   MPG 421 (H) 09/09/2011   TSH No results for input(s): TSH in the last 8760 hours.  External labs:   AIC 5.9% 01/22/2020  CMP 10/17/2019: Potassium 3.6, BUN 14, Creatinine 0.97, glucose 121  CBC 07/18/2019: Hemoglobin 11.5 Hematocrit 34.9   Lipid Profile 07/18/2019: Total cholesterol 180, LDL 98, HDL 64, Triglycerides 91  Medications and allergies   Allergies  Allergen Reactions   Ace Inhibitors     cough   Pollen Extract Other (See Comments)   Sulfamethoxazole-Trimethoprim Other (See Comments)    Hospital thinks that this is what caused the the GI bleed Hospital  thinks that this is what caused the the GI bleed      Outpatient Medications Prior to Visit  Medication Sig Dispense Refill   albuterol (VENTOLIN HFA) 108 (90 Base) MCG/ACT inhaler Inhale 2 puffs into the lungs every 4 (four) hours as needed for wheezing.      ARIPiprazole (ABILIFY) 10 MG tablet Take 10 mg by mouth daily.     aspirin 81 MG tablet Take 81 mg by mouth daily.     atenolol (TENORMIN) 50 MG tablet Take 2 tablets (100 mg total) by mouth 2 (two) times daily.  360 tablet 0   cetirizine (ZYRTEC) 10 MG tablet Take 10 mg by mouth daily as needed for allergies.      cycloSPORINE (RESTASIS) 0.05 % ophthalmic emulsion Place 1 drop into both eyes 2 (two) times daily.     Ferrous Sulfate (IRON) 325 (65 Fe) MG TABS Take 325 mg by mouth daily.      fluticasone (FLONASE) 50 MCG/ACT nasal spray Place 1 spray into the nose daily as needed for allergies.      Fluticasone-Salmeterol (ADVAIR) 250-50 MCG/DOSE AEPB Inhale 1 puff into the lungs 2 (two) times daily.     glucose blood test strip USE TO CHECK BLOOD SUGAR TWO TIMES DAILY     Lancets (ACCU-CHEK MULTICLIX) lancets 1 each by Misc.(Non-Drug; Combo Route) route daily.     LORazepam (ATIVAN) 0.5 MG tablet Take 1 tablet by mouth daily.     losartan-hydrochlorothiazide (HYZAAR) 100-25 MG tablet Take 1 tablet by mouth once daily 90 tablet 0   methocarbamol (ROBAXIN) 500 MG tablet Take 500 mg by mouth every 8 (eight) hours as needed.     Multiple Vitamin (MULTIVITAMIN) capsule Take 1 capsule by mouth daily.     omeprazole (PRILOSEC) 20 MG capsule Take 2 capsules (40 mg total) by mouth daily. (Patient taking differently: Take 20 mg by mouth daily.) 60 capsule 1   rosuvastatin (CRESTOR) 10 MG tablet Take 10 mg by mouth at bedtime.      sertraline (ZOLOFT) 100 MG tablet Take 100 mg by mouth 2 (two) times daily.     sitaGLIPtin-metformin (JANUMET) 50-1000 MG tablet Take 1 tablet by mouth 2 (two) times daily with a meal.       solifenacin (VESICARE) 5 MG tablet Take 5 mg by mouth daily.     traMADol (ULTRAM) 50 MG tablet Take 1 tablet (50 mg total) by mouth every 12 (twelve) hours as needed. 30 tablet 0   spironolactone (ALDACTONE) 25 MG tablet Take 1 tablet by mouth once daily 90 tablet 0   atenolol (TENORMIN) 100 MG tablet Take 1 tablet (100 mg total) by mouth 2 (two) times daily. (Patient not taking: Reported on 07/07/2020) 180 tablet 3   LORazepam (ATIVAN) 1 MG tablet Take 1-2 mg by mouth 2 (two) times daily as needed. (Patient not taking: Reported on 07/07/2020)     sertraline (ZOLOFT) 50 MG tablet Take 50 mg by mouth at bedtime.     No facility-administered medications prior to visit.    Radiology:   No results found.  Cardiac Studies:   Exercise myoview stress 10/06/2017:  1. The patient performed treadmill exercise using a Bruce protocol, completing 6 minutes. The patient completed an estimated workload of 7.05 METS, reaching 99% of the maximum predicted heart rate. Normal hemodynamic response. Fair exercise capacity. No stress symptoms reported. No ischemic changes seen on stress electrocardiogram. 2. The overall quality of the study is excellent. There is no evidence of abnormal lung activity. Stress and rest SPECT images demonstrate homogeneous tracer distribution throughout the myocardium. Gated SPECT imaging reveals normal myocardial thickening and wall motion. The left ventricular ejection fraction was normal (50%).   3. Low risk study.  Holter Monitor 24 hours 11/09/2017: No symptoms reported. Predominant rhythm is sinus tachycardia. Patient had 18 hours of sinus tachycardia, maximum heart rate 152 bpm. Average heart rate 106 bpm. Occasional PVCs.  Echocardiogram 03/12/2020 Normal LV systolic function with visual EF 50-55%. Left ventricle cavity  is normal in size. Moderate left ventricular hypertrophy. Normal global wall motion.  Doppler evidence of grade II diastolic dysfunction,  indeterminate  LAP.  Left atrial cavity is mildly dilated.  Moderate mitral valve leaflet thickening. Normal mitral valve leaflet mobility. No evidence of mitral stenosis. Mild (Grade I) mitral regurgitation.  Mild tricuspid regurgitation.  Mild pulmonic regurgitation.  Insignificant pericardial effusion.  Compared to prior study 11/15/2018: LVEF is improved from 40-45% to 50-55%, Grade 1 to Grade 2 DD, no significant change in valvular heart disease.    EKG   EKG 03/19/2020: Normal sinus rhythm at rate of 76 bpm, normal axis.  Poor R wave progression, cannot exclude anteroseptal infarct old.  Nonspecific T abnormality, cannot exclude anterior ischemia.  No significant change from 03/26/2019.  Assessment     ICD-10-CM   1. Essential hypertension  I10 spironolactone (ALDACTONE) 25 MG tablet  2. Non-ischemic cardiomyopathy (HCC)  I42.8   3. Chronic diastolic heart failure (HCC)  D53.29 spironolactone (ALDACTONE) 25 MG tablet     Medications Discontinued During This Encounter  Medication Reason   sertraline (ZOLOFT) 50 MG tablet Dose change   atenolol (TENORMIN) 100 MG tablet Change in therapy   LORazepam (ATIVAN) 1 MG tablet Dose change   spironolactone (ALDACTONE) 25 MG tablet Reorder    Meds ordered this encounter  Medications   spironolactone (ALDACTONE) 25 MG tablet    Sig: Take 1 tablet (25 mg total) by mouth daily.    Dispense:  90 tablet    Refill:  3    Recommendations:   Jean Melton is a 60 y.o. with type II diabetes, mixed hyperlipiedmia, and hypertension. In 2019 after a syncopal episode she was diagnosed with nonischemic cardiomyopathy and chronic systolic and diastolic heart failure. Systolic heart failure is no longer present.  Patient presents for follow-up of hypertension since starting spironolactone 25 mg daily.  Patient blood pressure is well controlled and she is tolerating spironolactone without issue, renal function has remained stable.  Again discussed weight loss  and encouraged her to increase her physical activity as recommended by her orthopedic doctor who is treating her for patella fracture.  Follow-up in 1 year, sooner if needed, for nonischemic cardiomyopathy, diastolic heart failure, hypertension.   Rayford Halsted, PA-C 07/07/2020, 12:23 PM Office: [phone]

## 2020-07-07 ENCOUNTER — Other Ambulatory Visit: Payer: Self-pay

## 2020-07-07 ENCOUNTER — Encounter: Payer: Self-pay | Admitting: Student

## 2020-07-07 ENCOUNTER — Ambulatory Visit: Payer: Medicaid Other | Admitting: Student

## 2020-07-07 VITALS — BP 108/74 | HR 68 | Ht 66.0 in | Wt 174.0 lb

## 2020-07-07 DIAGNOSIS — I428 Other cardiomyopathies: Secondary | ICD-10-CM

## 2020-07-07 DIAGNOSIS — I10 Essential (primary) hypertension: Secondary | ICD-10-CM

## 2020-07-07 DIAGNOSIS — I5032 Chronic diastolic (congestive) heart failure: Secondary | ICD-10-CM

## 2020-07-07 MED ORDER — SPIRONOLACTONE 25 MG PO TABS: 25.0000 mg | ORAL_TABLET | Freq: Every day | ORAL | 3 refills | Status: DC

## 2020-07-17 ENCOUNTER — Other Ambulatory Visit: Payer: Self-pay | Admitting: Family Medicine

## 2020-07-17 ENCOUNTER — Other Ambulatory Visit: Payer: Self-pay

## 2020-07-17 ENCOUNTER — Ambulatory Visit
Admission: RE | Admit: 2020-07-17 | Discharge: 2020-07-17 | Disposition: A | Discharge status: Home / Self Care | Payer: Medicaid Other | Source: Ambulatory Visit | Attending: Family Medicine | Admitting: Family Medicine

## 2020-07-17 DIAGNOSIS — M545 Low back pain, unspecified: Secondary | ICD-10-CM

## 2020-07-22 ENCOUNTER — Ambulatory Visit: Payer: Medicaid Other | Admitting: Orthopaedic Surgery

## 2020-07-24 ENCOUNTER — Other Ambulatory Visit: Payer: Self-pay | Admitting: Nurse Practitioner

## 2020-07-24 DIAGNOSIS — Z1231 Encounter for screening mammogram for malignant neoplasm of breast: Secondary | ICD-10-CM

## 2020-07-28 ENCOUNTER — Ambulatory Visit (INDEPENDENT_AMBULATORY_CARE_PROVIDER_SITE_OTHER): Payer: Medicaid Other | Admitting: Orthopaedic Surgery

## 2020-07-28 ENCOUNTER — Encounter: Payer: Self-pay | Admitting: Orthopaedic Surgery

## 2020-07-28 ENCOUNTER — Other Ambulatory Visit: Payer: Self-pay

## 2020-07-28 ENCOUNTER — Other Ambulatory Visit: Payer: Self-pay | Admitting: Cardiology

## 2020-07-28 ENCOUNTER — Ambulatory Visit (INDEPENDENT_AMBULATORY_CARE_PROVIDER_SITE_OTHER): Payer: Medicaid Other

## 2020-07-28 VITALS — BP 132/86 | Ht 66.0 in | Wt 176.0 lb

## 2020-07-28 DIAGNOSIS — I10 Essential (primary) hypertension: Secondary | ICD-10-CM

## 2020-07-28 DIAGNOSIS — S82141S Displaced bicondylar fracture of right tibia, sequela: Secondary | ICD-10-CM

## 2020-07-28 DIAGNOSIS — S82141D Displaced bicondylar fracture of right tibia, subsequent encounter for closed fracture with routine healing: Secondary | ICD-10-CM | POA: Diagnosis not present

## 2020-07-28 DIAGNOSIS — I5032 Chronic diastolic (congestive) heart failure: Secondary | ICD-10-CM

## 2020-07-28 NOTE — Progress Notes (Signed)
Office Visit Note   Patient: Jean Melton           Date of Birth: 12/02/1960           MRN: [id number] Visit Date: 07/28/2020              Requested by: Iona Hansen, NP [address] [place],  [place] [address] PCP: Iona Hansen, NP   Assessment & Plan: Visit Diagnoses:  1. Closed fracture of right tibial plateau, sequela     Plan: Patient states she has her first therapy visit coming up next week.  She needs to work hard on stretching her knees out to straight position.  In the supine position she still has a gap and cannot passively reach full extension either knee.  We discussed with her this will increase tension on her lower back.  She had recent plain radiographs at her PCP which was normal lumbar spine images.  She will work hard on getting full extension work on gait and I will recheck her in 5 weeks.  Follow-Up Instructions: Return in about 5 weeks (around 09/01/2020).   Orders:  Orders Placed This Encounter  Procedures  . XR Knee 1-2 Views Right   No orders of the defined types were placed in this encounter.     Procedures: No procedures performed   Clinical Data: No additional findings.   Subjective: Chief Complaint  Patient presents with  . Right Knee - Fracture, Follow-up    Fall 05/09/2020    HPI 60 year old female returns for follow-up of right knee injury where she had an anterior tibial eminence injury.  She has been amatory without a walker but is walking with a crouched knee gait states she is having pain at the lumbosacral junction and does not have full passive extension of either knee today.  She states when she tries to walk with her knee straight it was painful so she stopped walking with her knee straight and has been walking with a short stride crouched knee gait.  Review of Systems 14 point system update unchanged.   Objective: Vital Signs: BP 132/86   Ht 5\' 6"  (1.676 m)   Wt 176 lb (79.8 kg)   BMI 28.41 kg/m    Physical Exam Constitutional:      Appearance: She is well-developed.  HENT:     Head: Normocephalic.     Right Ear: External ear normal.     Left Ear: External ear normal.  Eyes:     Pupils: Pupils are equal, round, and reactive to light.  Neck:     Thyroid: No thyromegaly.     Trachea: No tracheal deviation.  Cardiovascular:     Rate and Rhythm: Normal rate.  Pulmonary:     Effort: Pulmonary effort is normal.  Abdominal:     Palpations: Abdomen is soft.  Skin:    General: Skin is warm and dry.  Neurological:     Mental Status: She is alert and oriented to person, place, and time.  Psychiatric:        Mood and Affect: Mood and affect normal.        Behavior: Behavior normal.     Ortho Exam patient's medial collateral takes good stress no medial joint opening no tenderness along the femoral attachment where x-rays show calcification.  Negative Lachman.  She has some tenderness along the joint line no knee effusion.  She lacks 10 degrees reaching full extension and tightens  her hamstrings as well as dorsiflexes her ankle due to pain.  Similar findings with her opposite noninjured knee.  Patient has a negative logroll the hips anterior tib EHL is intact negative popliteal compression test. Specialty Comments:  No specialty comments available.  Imaging: XR Knee 1-2 Views Right  Result Date: 07/28/2020 2 view x-rays right knee obtained and reviewed.  This shows tibial eminence anterior fracture unchanged in position.  There is slight calcification of the proximal portion of the MCL adjacent to the medial femoral condyle. Impression: Changes consistent with MCL strain proximal and unchanged anterior tibial eminence fracture.    PMFS History: Patient Active Problem List   Diagnosis Date Noted  . Tibial plateau fracture, right 06/17/2020  . Contusion of left knee 05/27/2020  . Non-ischemic cardiomyopathy (HCC) 10/06/2017  . Bipolar 2 disorder (HCC) 07/18/2017  . Arthritis  07/18/2017  . Iliotibial band syndrome of right side 05/05/2017  . Patellofemoral pain syndrome of both knees 10/28/2016  . Hematochezia   . Esophageal reflux   . Hypercholesterolemia   . Controlled diabetes mellitus type II without complication (HCC)   . Chronic pain of both ankles 07/29/2015  . Mixed incontinence 10/16/2014  . 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 Lichen planus 06/24/2014  . Rash 03/21/2014  . Left elbow pain 10/08/2013  . Hyponatremia 09/09/2011  . Hyperkalemia 09/09/2011  . HTN (hypertension) 09/09/2011  . Dehydration 09/09/2011  . GERD (gastroesophageal reflux disease) 09/09/2011  . Asthma 09/09/2011   Past Medical History:  Diagnosis Date  . Asthma   . B12 deficiency   . Bipolar 2 disorder (HCC)   . Depression   . Diabetes (HCC)   . GERD (gastroesophageal reflux disease)   . Heart failure (HCC)   . Hypercholesterolemia   . Hyperosmolarity due to secondary diabetes ([facility]CC) 09/11/2011  . Hypertension   . Mixed incontinence   . Non-ischemic cardiomyopathy (HCC)   . Osteoarthritis    knees    Family History  Problem Relation Age of Onset  . Diabetes Father   . Hypertension Father   . Heart failure Father     Past Surgical History:  Procedure Laterality Date  . ABDOMINAL HYSTERECTOMY    . ABDOMINAL SURGERY    . CATARACT EXTRACTION    . CESAREAN SECTION    . CHOLECYSTECTOMY    . COLONOSCOPY  02/02/2012   Procedure: COLONOSCOPY;  Surgeon: Vertell Novak., MD;  Location: [facility];  Service: Endoscopy;  Laterality: N/A;  . EYE SURGERY    . SKIN BIOPSY    . STOMACH SURGERY     Social History   Occupational History    Comment: disabled  Tobacco Use  . Smoking status: Never Smoker  . Smokeless tobacco: Never Used  Vaping Use  . Vaping Use: Never used  Substance and Sexual Activity  . Alcohol use: Yes    Comment: ocass  . Drug use: No  . Sexual activity: Yes    Birth control/protection: Surgical

## 2020-08-17 ENCOUNTER — Other Ambulatory Visit: Payer: Self-pay | Admitting: Cardiology

## 2020-08-17 DIAGNOSIS — I5042 Chronic combined systolic (congestive) and diastolic (congestive) heart failure: Secondary | ICD-10-CM

## 2020-08-17 DIAGNOSIS — I10 Essential (primary) hypertension: Secondary | ICD-10-CM

## 2020-08-18 ENCOUNTER — Other Ambulatory Visit: Payer: Self-pay

## 2020-08-18 ENCOUNTER — Ambulatory Visit (INDEPENDENT_AMBULATORY_CARE_PROVIDER_SITE_OTHER): Payer: Medicaid Other | Admitting: Certified Nurse Midwife

## 2020-08-18 ENCOUNTER — Encounter: Payer: Self-pay | Admitting: Certified Nurse Midwife

## 2020-08-18 VITALS — BP 129/87 | HR 83 | Wt 173.1 lb

## 2020-08-18 DIAGNOSIS — Z01419 Encounter for gynecological examination (general) (routine) without abnormal findings: Secondary | ICD-10-CM | POA: Diagnosis not present

## 2020-08-18 NOTE — Progress Notes (Signed)
GYNECOLOGY ANNUAL PREVENTATIVE CARE ENCOUNTER NOTE  History:     Jean Melton is a 60 y.o. [phone] female here for a routine annual gynecologic exam.  Current complaints: chronic 3/10 pain in her knees s/p fall in November of '21, no new complaints.  Denies abnormal vaginal bleeding, discharge, pelvic pain, problems with intercourse or other gynecologic concerns.    Gynecologic History No LMP recorded. Patient has had a hysterectomy. Last mammogram: 08/09/2019. Results were: normal  Mammogram scheduled for 09/07/2020  Obstetric History OB History  Gravida Para Term Preterm AB Living  4 4 4     5   SAB IAB Ectopic Multiple Live Births        1      # Outcome Date GA Lbr Len/2nd Weight Sex Delivery Anes PTL Lv  4 Term           3 Term           2 Term           1A Term           1B Term             Past Medical History:  Diagnosis Date  . Asthma   . B12 deficiency   . Bipolar 2 disorder (HCC)   . Depression   . Diabetes (HCC)   . GERD (gastroesophageal reflux disease)   . Heart failure (HCC)   . Hypercholesterolemia   . Hyperosmolarity due to secondary diabetes (HCC) 09/11/2011  . Hypertension   . Mixed incontinence   . Non-ischemic cardiomyopathy (HCC)   . Osteoarthritis    knees    Past Surgical History:  Procedure Laterality Date  . ABDOMINAL HYSTERECTOMY    . ABDOMINAL SURGERY    . CATARACT EXTRACTION    . CESAREAN SECTION    . CHOLECYSTECTOMY    . COLONOSCOPY  02/02/2012   Procedure: COLONOSCOPY;  Surgeon: 04/03/2012., MD;  Location: [facility];  Service: Endoscopy;  Laterality: N/A;  . EYE SURGERY    . SKIN BIOPSY    . STOMACH SURGERY      Current Outpatient Medications on File Prior to Visit  Medication Sig Dispense Refill  . albuterol (VENTOLIN HFA) 108 (90 Base) MCG/ACT inhaler Inhale 2 puffs into the lungs every 4 (four) hours as needed for wheezing.     . ARIPiprazole (ABILIFY) 10 MG tablet Take 10 mg by mouth daily.    [facility] aspirin 81 MG  tablet Take 81 mg by mouth daily.    Marland Kitchen atenolol (TENORMIN) 50 MG tablet Take 2 tablets (100 mg total) by mouth 2 (two) times daily. 360 tablet 0  . cetirizine (ZYRTEC) 10 MG tablet Take 10 mg by mouth daily as needed for allergies.     . cycloSPORINE (RESTASIS) 0.05 % ophthalmic emulsion Place 1 drop into both eyes 2 (two) times daily.    . Ferrous Sulfate (IRON) 325 (65 Fe) MG TABS Take 325 mg by mouth daily.     . fluticasone (FLONASE) 50 MCG/ACT nasal spray Place 1 spray into the nose daily as needed for allergies.     . Fluticasone-Salmeterol (ADVAIR) 250-50 MCG/DOSE AEPB Inhale 1 puff into the lungs 2 (two) times daily.    Marland Kitchen glucose blood test strip USE TO CHECK BLOOD SUGAR TWO TIMES DAILY    . Lancets (ACCU-CHEK MULTICLIX) lancets 1 each by Misc.(Non-Drug; Combo Route) route daily.    Marland Kitchen LORazepam (ATIVAN) 0.5 MG tablet Take  1 tablet by mouth daily.    Marland Kitchen losartan-hydrochlorothiazide (HYZAAR) 100-25 MG tablet Take 1 tablet by mouth once daily 90 tablet 0  . Multiple Vitamin (MULTIVITAMIN) capsule Take 1 capsule by mouth daily.    Marland Kitchen omeprazole (PRILOSEC) 20 MG capsule Take 2 capsules (40 mg total) by mouth daily. (Patient taking differently: Take 20 mg by mouth daily.) 60 capsule 1  . rosuvastatin (CRESTOR) 10 MG tablet Take 10 mg by mouth at bedtime.     . sertraline (ZOLOFT) 100 MG tablet Take 100 mg by mouth 2 (two) times daily.    . sitaGLIPtin-metformin (JANUMET) 50-1000 MG tablet Take 1 tablet by mouth 2 (two) times daily with a meal.     . solifenacin (VESICARE) 5 MG tablet Take 5 mg by mouth daily.    Marland Kitchen spironolactone (ALDACTONE) 25 MG tablet Take 1 tablet by mouth once daily 90 tablet 0  . traMADol (ULTRAM) 50 MG tablet Take 1 tablet (50 mg total) by mouth every 12 (twelve) hours as needed. 30 tablet 0  . methocarbamol (ROBAXIN) 500 MG tablet Take 500 mg by mouth every 8 (eight) hours as needed. (Patient not taking: Reported on 08/18/2020)    . [DISCONTINUED] buPROPion (WELLBUTRIN XL)  150 MG 24 hr tablet Take 150 mg by mouth daily.       No current facility-administered medications on file prior to visit.    Allergies  Allergen Reactions  . Ace Inhibitors     cough  . Pollen Extract Other (See Comments)  . Sulfamethoxazole-Trimethoprim Other (See Comments)    Hospital thinks that this is what caused the the GI bleed Hospital thinks that this is what caused the the GI bleed     Social History:  reports that she has never smoked. She has never used smokeless tobacco. She reports current alcohol use. She reports that she does not use drugs.  Family History  Problem Relation Age of Onset  . Diabetes Father   . Hypertension Father   . Heart failure Father     The following portions of the patient's history were reviewed and updated as appropriate: allergies, current medications, past family history, past medical history, past social history, past surgical history and problem list.  Review of Systems Pertinent items noted in HPI and remainder of comprehensive ROS otherwise negative.  Physical Exam:  BP 129/87   Pulse 83   Wt 173 lb 1.6 oz (78.5 kg)   BMI 27.94 kg/m  CONSTITUTIONAL: Well-developed, well-nourished female in no acute distress.  HENT:  Normocephalic, atraumatic EYES: Conjunctivae and EOM are normal. Pupils are equal, round, and reactive to light. No scleral icterus.  NECK: Normal range of motion, supple, no masses.  Normal thyroid.  SKIN: Skin is warm and dry. No rash noted. Not diaphoretic. No erythema. No pallor. MUSCULOSKELETAL: Normal range of motion. No tenderness.  No cyanosis, clubbing, or edema. NEUROLOGIC: Alert and oriented to person, place, and time. Normal reflexes, muscle tone coordination.  PSYCHIATRIC: Normal mood and affect. Normal behavior. Normal judgment and thought content. CARDIOVASCULAR: Normal heart rate noted, regular rhythm, S1/S2 RESPIRATORY: Clear to auscultation bilaterally. Effort and breath sounds normal, no  problems with respiration noted. BREASTS: Symmetric in size. Fibrous tissue noted bilaterally. No masses, tenderness, skin changes, nipple drainage, or lymphadenopathy bilaterally. Performed in the presence of a chaperone. ABDOMEN: Soft, no distention noted. Old scar noted midline. No tenderness, rebound or guarding. Bowel sounds hypoactive. PELVIC: Deferred due to s/p hysterectomy 20 years prior with no new  concerns.   Assessment and Plan:    1. Women's annual routine gynecological examination  Please present with any new complaints regarding pain, discharge, problems with intercourse, or other gynecological concerns. Mammogram scheduled 09/07/2020 Routine preventative health maintenance measures emphasized. Please refer to After Visit Summary for other counseling recommendations.        [facility] [employer], [facility]

## 2020-08-28 ENCOUNTER — Other Ambulatory Visit: Payer: Self-pay | Admitting: Cardiology

## 2020-09-01 ENCOUNTER — Ambulatory Visit: Payer: Medicaid Other | Admitting: Orthopaedic Surgery

## 2020-09-07 ENCOUNTER — Other Ambulatory Visit: Payer: Self-pay

## 2020-09-07 ENCOUNTER — Ambulatory Visit
Admission: RE | Admit: 2020-09-07 | Discharge: 2020-09-07 | Disposition: A | Discharge status: Home / Self Care | Payer: Medicaid Other | Source: Ambulatory Visit | Attending: Nurse Practitioner | Admitting: Nurse Practitioner

## 2020-09-07 DIAGNOSIS — Z1231 Encounter for screening mammogram for malignant neoplasm of breast: Secondary | ICD-10-CM

## 2020-09-21 ENCOUNTER — Other Ambulatory Visit: Payer: Self-pay | Admitting: Cardiology

## 2020-09-21 DIAGNOSIS — I5032 Chronic diastolic (congestive) heart failure: Secondary | ICD-10-CM

## 2020-09-21 DIAGNOSIS — I10 Essential (primary) hypertension: Secondary | ICD-10-CM

## 2020-10-20 ENCOUNTER — Ambulatory Visit: Payer: Medicaid Other | Admitting: Orthopaedic Surgery

## 2020-10-27 ENCOUNTER — Ambulatory Visit: Payer: Medicaid Other | Admitting: Orthopaedic Surgery

## 2020-12-02 ENCOUNTER — Encounter: Payer: Self-pay | Admitting: Orthopaedic Surgery

## 2020-12-02 ENCOUNTER — Ambulatory Visit (INDEPENDENT_AMBULATORY_CARE_PROVIDER_SITE_OTHER): Payer: Medicaid Other | Admitting: Orthopaedic Surgery

## 2020-12-02 VITALS — BP 110/75 | HR 79 | Ht 66.0 in | Wt 174.0 lb

## 2020-12-02 DIAGNOSIS — S82141S Displaced bicondylar fracture of right tibia, sequela: Secondary | ICD-10-CM | POA: Diagnosis not present

## 2020-12-02 DIAGNOSIS — M25561 Pain in right knee: Secondary | ICD-10-CM

## 2020-12-02 NOTE — Progress Notes (Signed)
Office Visit Note   Patient: Jean Melton           Date of Birth: 1961-04-11           MRN: [id number] Visit Date: 12/02/2020              Requested by: Iona Hansen, NP [address] [place],  [place] [address] PCP: Iona Hansen, NP   Assessment & Plan: Visit Diagnoses:  1. Closed fracture of right tibial plateau, sequela     Plan: Patient has healed tibial plateau fracture but has slight knee flexion contracture.  We discussed the importance of prone positioning to help with her short stride gait and improve her stride length as well as standing ability since she fatigues with the slight knee flexion.  She will work hard on this at home we went over the exercises she is able to demonstrate knowledge to what she needs to work on.  Recheck 2 months.  Follow-Up Instructions: Return in about 2 months (around 02/01/2021).   Orders:  No orders of the defined types were placed in this encounter.  No orders of the defined types were placed in this encounter.     Procedures: No procedures performed   Clinical Data: No additional findings.   Subjective: Chief Complaint  Patient presents with  . Right Knee - Follow-up    Fall 05/09/2020  . Left Knee - Follow-up    HPI 60 year old female returns with sequela to tibial plateau fracture posterior lateral corner.  She went to 1 therapy visit states it was making it hurt worse so she did not go back.  She is used Tylenol.  She is amatory without a cane but still does not have full extension of her knee.  She did do the prone positioning with ankle weights for short period of time but noticed it caused some pain so she stopped.  Review of Systems possible bipolar disorder, hypertension all other systems 14 point noncontributory other than diabetes.   Objective: Vital Signs: BP 110/75   Pulse 79   Ht 5\' 6"  (1.676 m)   Wt 174 lb (78.9 kg)   BMI 28.08 kg/m   Physical Exam Constitutional:      Appearance: She  is well-developed.  HENT:     Head: Normocephalic.     Right Ear: External ear normal.     Left Ear: External ear normal.  Eyes:     Pupils: Pupils are equal, round, and reactive to light.  Neck:     Thyroid: No thyromegaly.     Trachea: No tracheal deviation.  Cardiovascular:     Rate and Rhythm: Normal rate.  Pulmonary:     Effort: Pulmonary effort is normal.  Abdominal:     Palpations: Abdomen is soft.  Skin:    General: Skin is warm and dry.  Neurological:     Mental Status: She is alert and oriented to person, place, and time.  Psychiatric:        Behavior: Behavior normal.     Ortho Exam no knee effusion right knee.  She lacks 5 degrees reaching full extension and I can reach full extension with pressure but it is bouncy and rebounds.  Good flexion collateral ligaments were balanced.  Specialty Comments:  No specialty comments available.  Imaging: No results found.   PMFS History: Patient Active Problem List   Diagnosis Date Noted  . Tibial plateau fracture, right 06/17/2020  . Contusion  of left knee 05/27/2020  . Non-ischemic cardiomyopathy (HCC) 10/06/2017  . Bipolar 2 disorder (HCC) 07/18/2017  . Arthritis 07/18/2017  . Iliotibial band syndrome of right side 05/05/2017  . Patellofemoral pain syndrome of both knees 10/28/2016  . Hematochezia   . Esophageal reflux   . Hypercholesterolemia   . Controlled diabetes mellitus type II without complication (HCC)   . Chronic pain of both ankles 07/29/2015  . Mixed incontinence 10/16/2014  . 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 Lichen planus 06/24/2014  . Rash 03/21/2014  . Left elbow pain 10/08/2013  . Hyponatremia 09/09/2011  . Hyperkalemia 09/09/2011  . HTN (hypertension) 09/09/2011  . Dehydration 09/09/2011  . GERD (gastroesophageal reflux disease) 09/09/2011  . Asthma 09/09/2011   Past Medical History:  Diagnosis Date  . Asthma   . B12 deficiency   . Bipolar 2 disorder (HCC)   . Depression   .  Diabetes (HCC)   . GERD (gastroesophageal reflux disease)   . Heart failure (HCC)   . Hypercholesterolemia   . Hyperosmolarity due to secondary diabetes (HCC) 09/11/2011  . Hypertension   . Mixed incontinence   . Non-ischemic cardiomyopathy (HCC)   . Osteoarthritis    knees    Family History  Problem Relation Age of Onset  . Diabetes Father   . Hypertension Father   . Heart failure Father     Past Surgical History:  Procedure Laterality Date  . ABDOMINAL HYSTERECTOMY    . ABDOMINAL SURGERY    . CATARACT EXTRACTION    . CESAREAN SECTION    . CHOLECYSTECTOMY    . COLONOSCOPY  02/02/2012   Procedure: COLONOSCOPY;  Surgeon: [facility]., MD;  Location: [facility];  Service: Endoscopy;  Laterality: N/A;  . EYE SURGERY    . SKIN BIOPSY    . STOMACH SURGERY     Social History   Occupational History    Comment: disabled  Tobacco Use  . Smoking status: Never Smoker  . Smokeless tobacco: Never Used  Vaping Use  . Vaping Use: Never used  Substance and Sexual Activity  . Alcohol use: Yes    Comment: ocass  . Drug use: No  . Sexual activity: Yes    Birth control/protection: Surgical

## 2020-12-08 ENCOUNTER — Encounter: Payer: Self-pay | Admitting: Student

## 2020-12-19 ENCOUNTER — Other Ambulatory Visit: Payer: Self-pay | Admitting: Cardiology

## 2020-12-19 DIAGNOSIS — I10 Essential (primary) hypertension: Secondary | ICD-10-CM

## 2020-12-19 DIAGNOSIS — I5042 Chronic combined systolic (congestive) and diastolic (congestive) heart failure: Secondary | ICD-10-CM

## 2021-02-02 ENCOUNTER — Ambulatory Visit: Payer: Medicaid Other | Admitting: Orthopaedic Surgery

## 2021-04-22 ENCOUNTER — Other Ambulatory Visit: Payer: Self-pay | Admitting: Cardiology

## 2021-04-22 DIAGNOSIS — I10 Essential (primary) hypertension: Secondary | ICD-10-CM

## 2021-04-22 DIAGNOSIS — I5042 Chronic combined systolic (congestive) and diastolic (congestive) heart failure: Secondary | ICD-10-CM

## 2021-06-08 ENCOUNTER — Other Ambulatory Visit: Payer: Self-pay | Admitting: Cardiology

## 2021-07-06 NOTE — Progress Notes (Signed)
Primary Physician/Referring:  Berkley Harvey, NP  Patient ID: Jean Melton, female    DOB: 05/14/1961, 61 y.o.   MRN: [id number]  Chief Complaint  Patient presents with   Cardiomyopathy   Follow-up   HPI:    Jean Melton  is a 61 y.o. female with with type II diabetes, mixed hyperlipiedmia, and hypertension. She had a syncope episode in March 2019. Nuclear stress test in April 2019 had revealed no evidence of ischemia and EF around 40% by echocardiogram at that time. She has nonischemic cardiomyopathy and chronic diastolic heart failure probably related to hypertensive heart disease.   Patient presents for annual follow-up of nonischemic cardiomyopathy, diastolic heart failure, hypertension.  Last office visit patient was stable from a cardiovascular standpoint and no changes were made.  Patient continues to feel well overall without specific complaints.  She does admit that she has not been active over the last several months.  States she has exercise equipment at home but does not use it.  Denies chest pain, palpitations, orthopnea, PND, leg edema, dizziness, syncope, near syncope.  Past Medical History:  Diagnosis Date   Asthma    B12 deficiency    Bipolar 2 disorder ([place])    Depression    Diabetes ([place])    GERD (gastroesophageal reflux disease)    Heart failure (HCC)    Hypercholesterolemia    Hyperosmolarity due to secondary diabetes ([place]) 09/11/2011   Hypertension    Mixed incontinence    Non-ischemic cardiomyopathy (HCC)    Osteoarthritis    knees   Past Surgical History:  Procedure Laterality Date   ABDOMINAL HYSTERECTOMY     ABDOMINAL SURGERY     CATARACT EXTRACTION     CESAREAN SECTION     CHOLECYSTECTOMY     COLONOSCOPY  02/02/2012   Procedure: COLONOSCOPY;  Surgeon: Winfield Cunas., MD;  Location: [facility];  Service: Endoscopy;  Laterality: N/A;   EYE SURGERY     SKIN BIOPSY     STOMACH SURGERY     Family History  Problem Relation Age of Onset    Diabetes Father    Hypertension Father    Heart failure Father     Social History   Tobacco Use   Smoking status: Never   Smokeless tobacco: Never  Substance Use Topics   Alcohol use: Yes    Comment: ocass   Marital Status: Single  ROS  Review of Systems  Constitutional: Negative for malaise/fatigue and weight gain.  Cardiovascular:  Negative for chest pain, claudication, dyspnea on exertion, leg swelling, near-syncope, orthopnea, palpitations, paroxysmal nocturnal dyspnea and syncope.  Respiratory:  Negative for shortness of breath.   Hematologic/Lymphatic: Does not bruise/bleed easily.  Musculoskeletal:  Positive for joint pain (bilateral knee).  Gastrointestinal:  Negative for heartburn and melena.  Neurological:  Negative for dizziness, light-headedness and weakness.  Objective  Blood pressure 117/83, pulse 72, temperature 98.2 F (36.8 C), temperature source Temporal, height 5\' 6"  (1.676 m), weight 184 lb 9.6 oz (83.7 kg), SpO2 96 %.  Vitals with BMI 07/07/2021 12/02/2020 08/18/2020  Height 5\' 6"  5\' 6"  -  Weight 184 lbs 10 oz 174 lbs 173 lbs 2 oz  BMI 29.81 [id number]  Systolic [id number]  Diastolic 83 75 87  Pulse 72 79 83     Physical Exam Vitals reviewed.  Constitutional:      Comments: Overweight female in no acute distress  Cardiovascular:     Rate and Rhythm: Normal  rate and regular rhythm.     Pulses: Intact distal pulses.     Heart sounds: Normal heart sounds, S1 normal and S2 normal. No murmur heard.   No gallop.     Comments: No JVD. Pulmonary:     Effort: Pulmonary effort is normal. No respiratory distress.     Breath sounds: Normal breath sounds. No wheezing, rhonchi or rales.  Musculoskeletal:     Right lower leg: No edema.     Left lower leg: No edema.  Neurological:     Mental Status: She is alert and oriented to person, place, and time.   Laboratory examination:   No results for input(s): NA, K, CL, CO2, GLUCOSE, BUN, CREATININE, CALCIUM,  GFRNONAA, GFRAA in the last 8760 hours. CrCl cannot be calculated (Patient's most recent lab result is older than the maximum 21 days allowed.).  CMP Latest Ref Rng & Units 03/26/2019 10/27/2015 10/26/2015  Glucose 70 - 99 mg/dL 121(H) 152(H) 120(H)  BUN 6 - 20 mg/dL 22(H) 13 16  Creatinine 0.44 - 1.00 mg/dL 1.03(H) 0.83 0.82  Sodium 135 - 145 mmol/L 135 140 135  Potassium 3.5 - 5.1 mmol/L 3.1(L) 4.3 4.4  Chloride 98 - 111 mmol/L 99 108 102  CO2 22 - 32 mmol/L 23 27 26   Calcium 8.9 - 10.3 mg/dL 9.5 9.8 10.1  Total Protein 6.5 - 8.1 g/dL 7.8 - 8.4(H)  Total Bilirubin 0.3 - 1.2 mg/dL 0.1(L) - 0.1(L)  Alkaline Phos 38 - 126 U/L 47 - 53  AST 15 - 41 U/L 20 - 13(L)  ALT 0 - 44 U/L 16 - 11(L)   CBC Latest Ref Rng & Units 03/26/2019 10/27/2015 10/26/2015  WBC 4.0 - 10.5 K/uL 15.1(H) - -  Hemoglobin 12.0 - 15.0 g/dL 12.5 11.7(L) 10.9(L)  Hematocrit 36.0 - 46.0 % 37.8 37.0 33.7(L)  Platelets 150 - 400 K/uL 368 - -    Lipid Panel No results for input(s): CHOL, TRIG, LDLCALC, VLDL, HDL, CHOLHDL, LDLDIRECT in the last 8760 hours.  HEMOGLOBIN A1C Lab Results  Component Value Date   HGBA1C 16.3 (H) 09/09/2011   MPG 421 (H) 09/09/2011   TSH No results for input(s): TSH in the last 8760 hours.  External labs:   AIC 5.9% 01/22/2020  CMP 10/17/2019: Potassium 3.6, BUN 14, Creatinine 0.97, glucose 121  CBC 07/18/2019: Hemoglobin 11.5 Hematocrit 34.9   Lipid Profile 07/18/2019: Total cholesterol 180, LDL 98, HDL 64, Triglycerides 91  Allergies   Allergies  Allergen Reactions   Ace Inhibitors     cough   Pollen Extract Other (See Comments)   Sulfamethoxazole-Trimethoprim Other (See Comments)    Hospital thinks that this is what caused the the GI bleed Hospital thinks that this is what caused the the GI bleed     Medications Prior to Visit:   Outpatient Medications Prior to Visit  Medication Sig Dispense Refill   albuterol (VENTOLIN HFA) 108 (90 Base) MCG/ACT inhaler Inhale 2 puffs into  the lungs every 4 (four) hours as needed for wheezing.      ARIPiprazole (ABILIFY) 10 MG tablet Take 10 mg by mouth daily.     aspirin 81 MG tablet Take 81 mg by mouth daily.     atenolol (TENORMIN) 50 MG tablet Take 2 tablets by mouth twice daily 360 tablet 0   cetirizine (ZYRTEC) 10 MG tablet Take 10 mg by mouth daily as needed for allergies.      cycloSPORINE (RESTASIS) 0.05 % ophthalmic emulsion Place 1 drop  into both eyes 2 (two) times daily.     Ferrous Sulfate (IRON) 325 (65 Fe) MG TABS Take 325 mg by mouth daily.      fluticasone (FLONASE) 50 MCG/ACT nasal spray Place 1 spray into the nose daily as needed for allergies.      Fluticasone-Salmeterol (ADVAIR) 250-50 MCG/DOSE AEPB Inhale 1 puff into the lungs 2 (two) times daily.     glucose blood test strip USE TO CHECK BLOOD SUGAR TWO TIMES DAILY     Lancets (ACCU-CHEK MULTICLIX) lancets 1 each by Misc.(Non-Drug; Combo Route) route daily.     LORazepam (ATIVAN) 0.5 MG tablet Take 1 tablet by mouth daily.     losartan-hydrochlorothiazide (HYZAAR) 100-25 MG tablet Take 1 tablet by mouth once daily 90 tablet 0   methocarbamol (ROBAXIN) 500 MG tablet Take 500 mg by mouth every 8 (eight) hours as needed.     Multiple Vitamin (MULTIVITAMIN) capsule Take 1 capsule by mouth daily.     omeprazole (PRILOSEC) 20 MG capsule Take 2 capsules (40 mg total) by mouth daily. (Patient taking differently: Take 20 mg by mouth daily.) 60 capsule 1   rosuvastatin (CRESTOR) 10 MG tablet Take 10 mg by mouth at bedtime.      sertraline (ZOLOFT) 100 MG tablet Take 100 mg by mouth 2 (two) times daily.     sitaGLIPtin-metformin (JANUMET) 50-1000 MG tablet Take 1 tablet by mouth 2 (two) times daily with a meal.      solifenacin (VESICARE) 5 MG tablet Take 5 mg by mouth daily.     spironolactone (ALDACTONE) 25 MG tablet Take 1 tablet by mouth once daily 90 tablet 0   traMADol (ULTRAM) 50 MG tablet Take 1 tablet (50 mg total) by mouth every 12 (twelve) hours as needed.  30 tablet 0   FARXIGA 5 MG TABS tablet Take 5 mg by mouth daily.     No facility-administered medications prior to visit.   Final Medications at End of Visit    Current Meds  Medication Sig   albuterol (VENTOLIN HFA) 108 (90 Base) MCG/ACT inhaler Inhale 2 puffs into the lungs every 4 (four) hours as needed for wheezing.    ARIPiprazole (ABILIFY) 10 MG tablet Take 10 mg by mouth daily.   aspirin 81 MG tablet Take 81 mg by mouth daily.   atenolol (TENORMIN) 50 MG tablet Take 2 tablets by mouth twice daily   cetirizine (ZYRTEC) 10 MG tablet Take 10 mg by mouth daily as needed for allergies.    cycloSPORINE (RESTASIS) 0.05 % ophthalmic emulsion Place 1 drop into both eyes 2 (two) times daily.   Ferrous Sulfate (IRON) 325 (65 Fe) MG TABS Take 325 mg by mouth daily.    fluticasone (FLONASE) 50 MCG/ACT nasal spray Place 1 spray into the nose daily as needed for allergies.    Fluticasone-Salmeterol (ADVAIR) 250-50 MCG/DOSE AEPB Inhale 1 puff into the lungs 2 (two) times daily.   glucose blood test strip USE TO CHECK BLOOD SUGAR TWO TIMES DAILY   Lancets (ACCU-CHEK MULTICLIX) lancets 1 each by Misc.(Non-Drug; Combo Route) route daily.   LORazepam (ATIVAN) 0.5 MG tablet Take 1 tablet by mouth daily.   losartan-hydrochlorothiazide (HYZAAR) 100-25 MG tablet Take 1 tablet by mouth once daily   methocarbamol (ROBAXIN) 500 MG tablet Take 500 mg by mouth every 8 (eight) hours as needed.   Multiple Vitamin (MULTIVITAMIN) capsule Take 1 capsule by mouth daily.   omeprazole (PRILOSEC) 20 MG capsule Take 2 capsules (40 mg total)  by mouth daily. (Patient taking differently: Take 20 mg by mouth daily.)   rosuvastatin (CRESTOR) 10 MG tablet Take 10 mg by mouth at bedtime.    sertraline (ZOLOFT) 100 MG tablet Take 100 mg by mouth 2 (two) times daily.   sitaGLIPtin-metformin (JANUMET) 50-1000 MG tablet Take 1 tablet by mouth 2 (two) times daily with a meal.    solifenacin (VESICARE) 5 MG tablet Take 5 mg by mouth  daily.   spironolactone (ALDACTONE) 25 MG tablet Take 1 tablet by mouth once daily   traMADol (ULTRAM) 50 MG tablet Take 1 tablet (50 mg total) by mouth every 12 (twelve) hours as needed.   Radiology:   No results found.  Cardiac Studies:   Exercise myoview stress 10/06/2017:  1. The patient performed treadmill exercise using a Bruce protocol, completing 6 minutes. The patient completed an estimated workload of 7.05 METS, reaching 99% of the maximum predicted heart rate. Normal hemodynamic response. Fair exercise capacity. No stress symptoms reported. No ischemic changes seen on stress electrocardiogram. 2. The overall quality of the study is excellent. There is no evidence of abnormal lung activity. Stress and rest SPECT images demonstrate homogeneous tracer distribution throughout the myocardium. Gated SPECT imaging reveals normal myocardial thickening and wall motion. The left ventricular ejection fraction was normal (50%).   3. Low risk study.   Holter Monitor 24 hours 11/09/2017: No symptoms reported. Predominant rhythm is sinus tachycardia. Patient had 18 hours of sinus tachycardia, maximum heart rate 152 bpm. Average heart rate 106 bpm. Occasional PVCs.  Echocardiogram 0000000 Normal LV systolic function with visual EF 50-55%. Left ventricle cavity  is normal in size. Moderate left ventricular hypertrophy. Normal global wall motion. Doppler evidence of grade II diastolic dysfunction,  indeterminate LAP.  Left atrial cavity is mildly dilated.  Moderate mitral valve leaflet thickening. Normal mitral valve leaflet mobility. No evidence of mitral stenosis. Mild (Grade I) mitral regurgitation.  Mild tricuspid regurgitation.  Mild pulmonic regurgitation.  Insignificant pericardial effusion.  Compared to prior study 11/15/2018: LVEF is improved from 40-45% to 50-55%, Grade 1 to Grade 2 DD, no significant change in valvular heart disease.    EKG  07/07/21: Sinus rhythm at a rate of 73  bpm.  Normal axis.  Poor refreshing, cannot exclude anteroseptal infarct old.  Nonspecific T wave abnormality, cannot exclude intercurrent ischemia, less prominent compared to EKG 03/19/2020.  03/19/2020: Normal sinus rhythm at rate of 76 bpm, normal axis.  Poor R wave progression, cannot exclude anteroseptal infarct old.  Nonspecific T abnormality, cannot exclude anterior ischemia.  No significant change from 03/26/2019.  Assessment     ICD-10-CM   1. Non-ischemic cardiomyopathy (HCC)  I42.8 EKG 12-Lead    PCV ECHOCARDIOGRAM COMPLETE    2. Chronic diastolic heart failure (HCC)  I50.32 PCV ECHOCARDIOGRAM COMPLETE    3. Essential hypertension  I10        There are no discontinued medications.   No orders of the defined types were placed in this encounter.   Recommendations:   Jean Melton is a 61 y.o. with type II diabetes, mixed hyperlipiedmia, and hypertension. In 2019 after a syncopal episode she was diagnosed with nonischemic cardiomyopathy and chronic systolic and diastolic heart failure. Systolic heart failure is no longer present.  Patient presents for annual follow-up of nonischemic cardiomyopathy, diastolic heart failure, hypertension.  Last office visit patient was stable from a cardiovascular standpoint and no changes were made.  Patient remains relatively asymptomatic and there is no clinical evidence  of heart failure at this time.  Encouraged her to increase physical activity and focus on weight loss as her weight has trended up approximately 10 pounds over the last 1 year.  We will obtain repeat echocardiogram prior to next office visit.  Patient is otherwise stable from a cardiovascular standpoint.  Blood pressure is well controlled.  I personally reviewed external labs, lipids are under excellent control.  No changes are made at today's office visit.  Follow-up in 1 year, sooner if needed, for nonischemic cardiomyopathy, hypertension, diastolic heart failure.   Alethia Berthold, PA-C 07/07/2021, 11:22 AM Office: [phone]

## 2021-07-07 ENCOUNTER — Ambulatory Visit: Payer: Medicaid Other | Admitting: Student

## 2021-07-07 ENCOUNTER — Other Ambulatory Visit: Payer: Self-pay

## 2021-07-07 ENCOUNTER — Encounter: Payer: Self-pay | Admitting: Student

## 2021-07-07 VITALS — BP 117/83 | HR 72 | Temp 98.2°F | Ht 66.0 in | Wt 184.6 lb

## 2021-07-07 DIAGNOSIS — I10 Essential (primary) hypertension: Secondary | ICD-10-CM

## 2021-07-07 DIAGNOSIS — I5032 Chronic diastolic (congestive) heart failure: Secondary | ICD-10-CM

## 2021-07-07 DIAGNOSIS — I428 Other cardiomyopathies: Secondary | ICD-10-CM

## 2021-07-12 ENCOUNTER — Other Ambulatory Visit: Payer: Self-pay | Admitting: Nurse Practitioner

## 2021-07-12 DIAGNOSIS — Z1231 Encounter for screening mammogram for malignant neoplasm of breast: Secondary | ICD-10-CM

## 2021-07-18 ENCOUNTER — Other Ambulatory Visit: Payer: Self-pay | Admitting: Cardiology

## 2021-07-18 DIAGNOSIS — I5042 Chronic combined systolic (congestive) and diastolic (congestive) heart failure: Secondary | ICD-10-CM

## 2021-07-18 DIAGNOSIS — I10 Essential (primary) hypertension: Secondary | ICD-10-CM

## 2021-09-02 IMAGING — MG MM DIGITAL SCREENING BILAT W/ TOMO AND CAD
8 series · 8 of 24 positions shown · non-contrast
Comparison: Previous exam(s).

CLINICAL DATA: Screening.

EXAM:
DIGITAL SCREENING BILATERAL MAMMOGRAM WITH TOMOSYNTHESIS AND CAD
TECHNIQUE: Bilateral screening digital craniocaudal and mediolateral oblique
mammograms were obtained. Bilateral screening digital breast
tomosynthesis was performed. The images were evaluated with
computer-aided detection.

[L CC synth-2D]
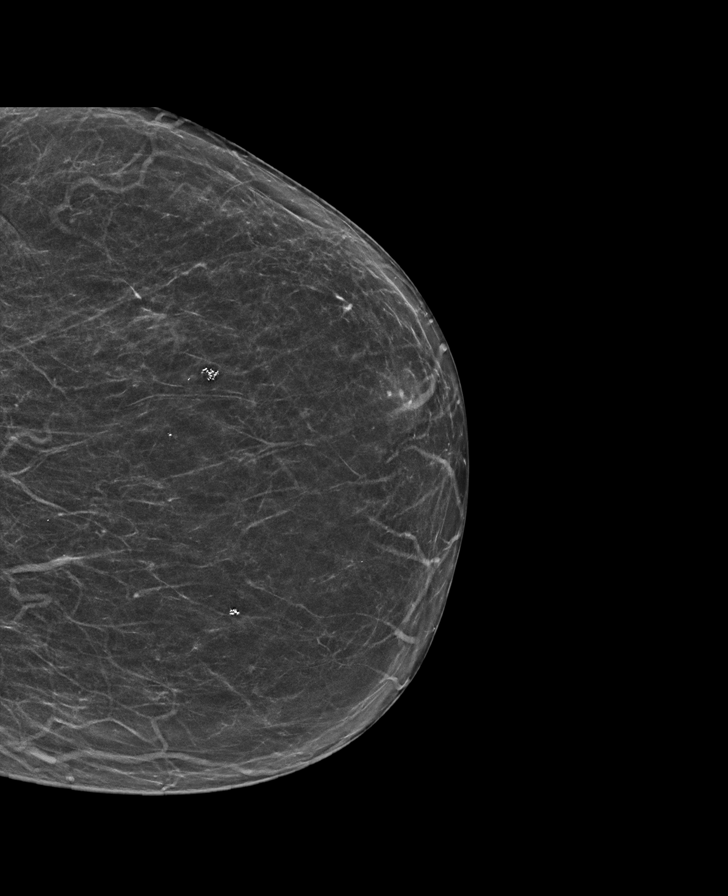

[R MLO synth-2D]
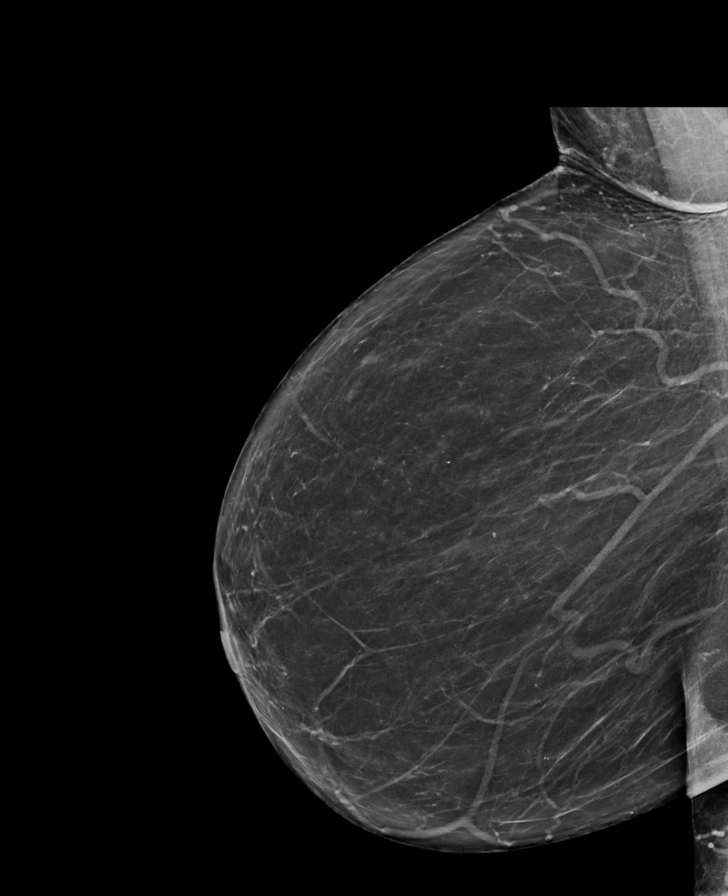

[L MLO synth-2D]
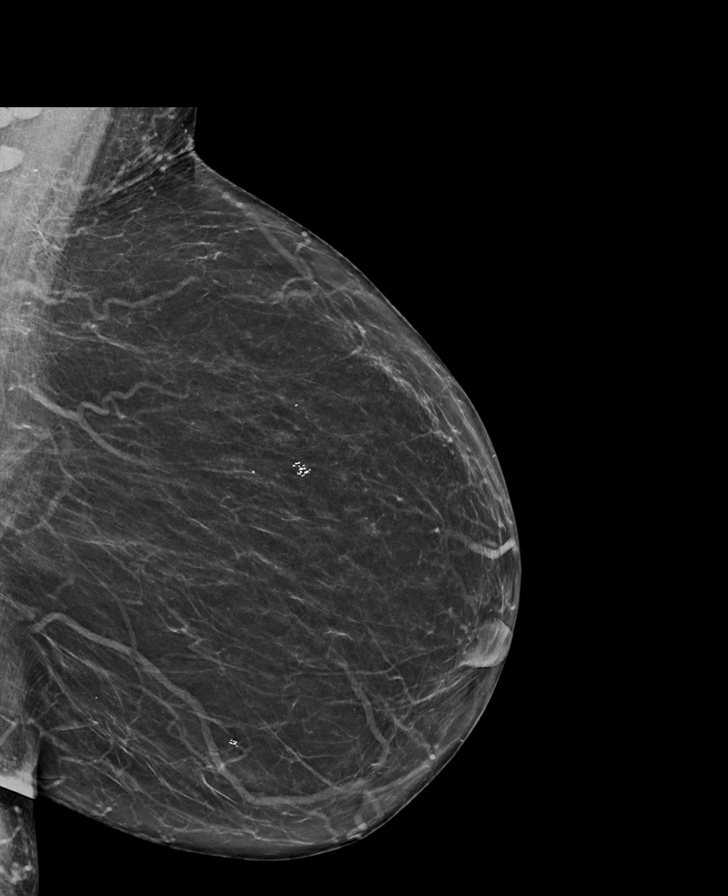

[R CC synth-2D]
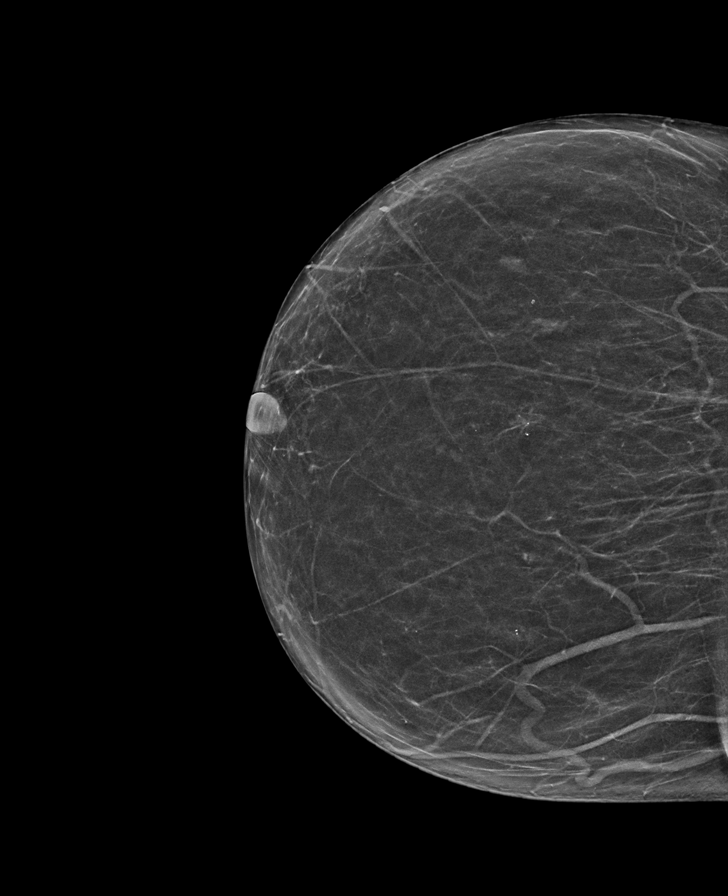

[L MLO tomo · tomo slice 35/70.0]
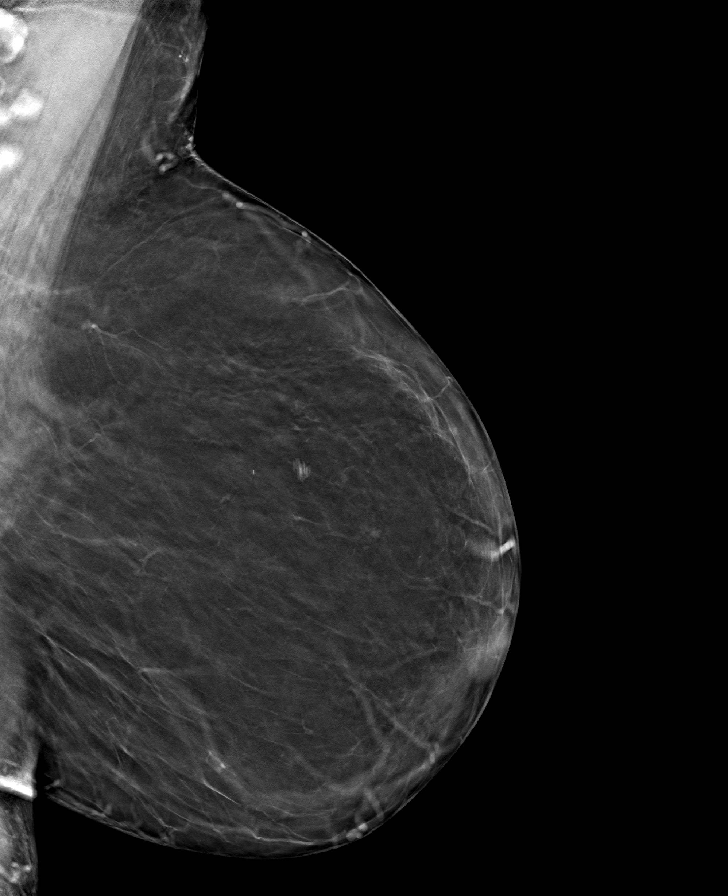

[L CC tomo · tomo slice 32/63.0]
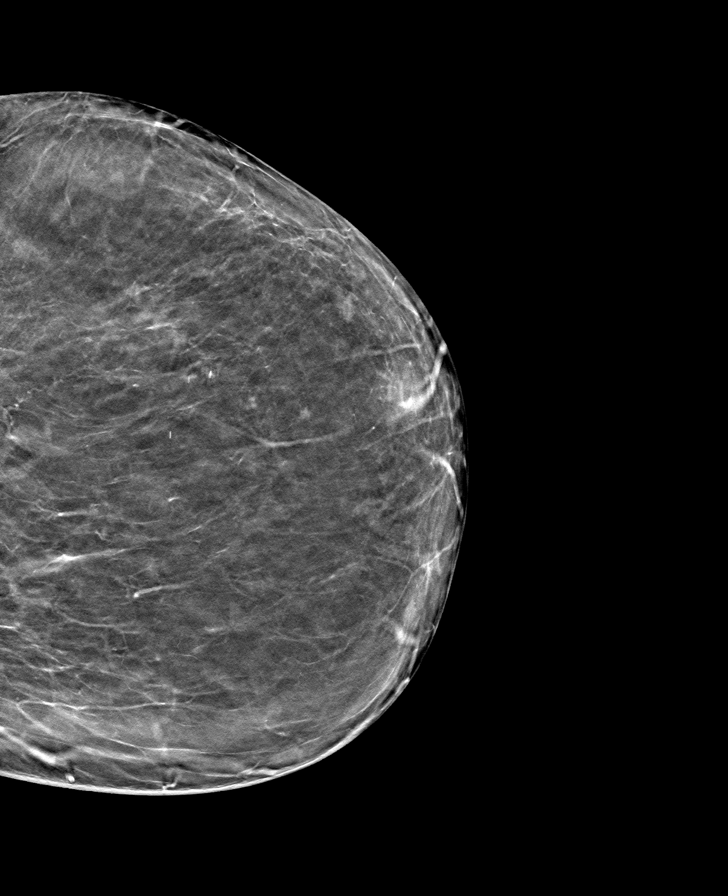

[R CC tomo · tomo slice 31/60.0]
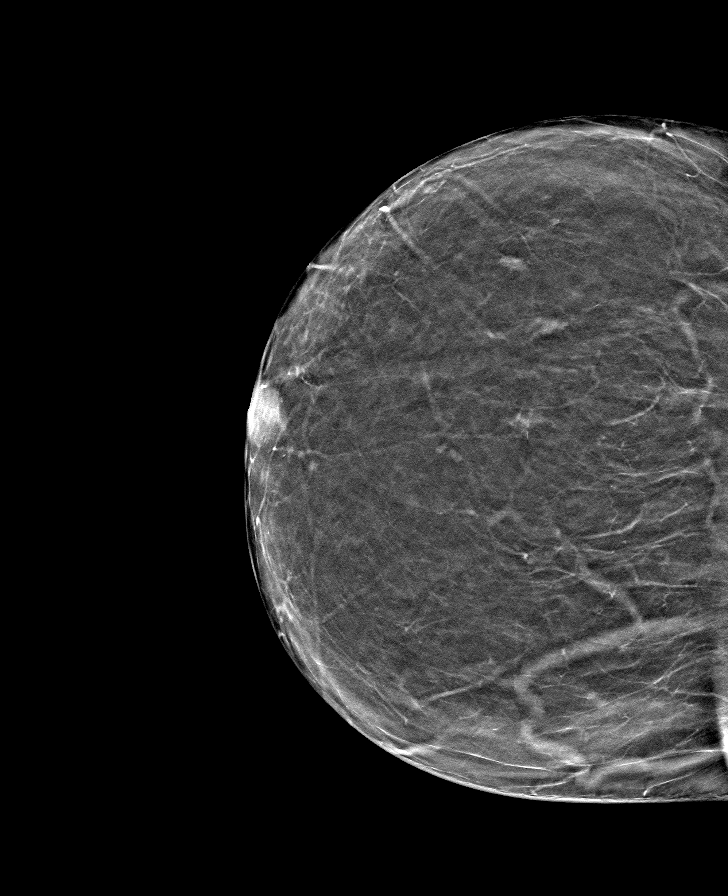

[R MLO tomo · tomo slice 36/71.0]
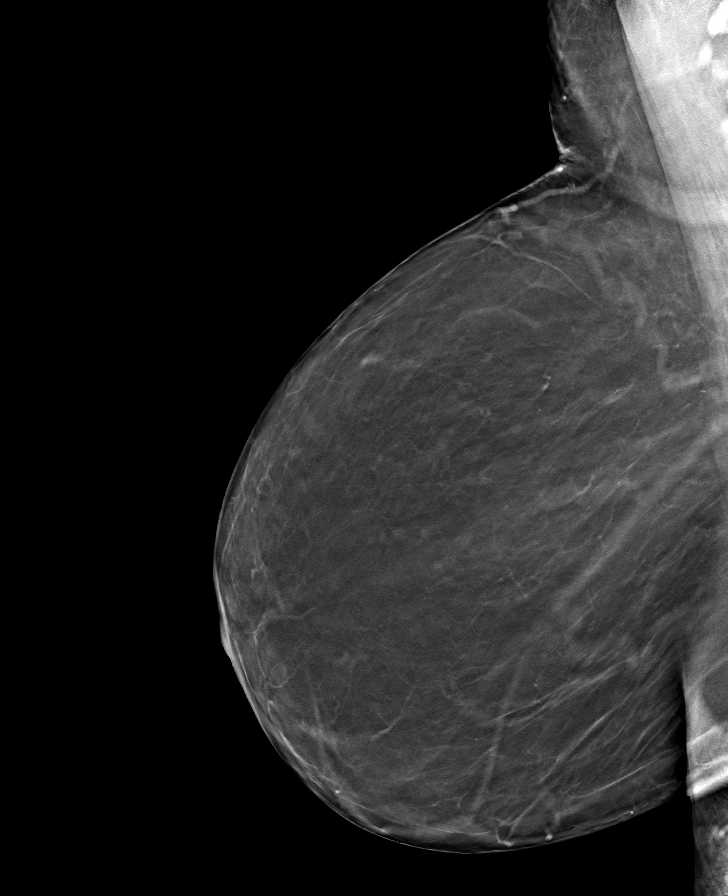

[8 of 24 positions shown; findings below may reference images not displayed]

ACR Breast Density Category b: There are scattered areas of
fibroglandular density.
FINDINGS: There are no findings suspicious for malignancy. The images were
evaluated with computer-aided detection.
IMPRESSION: No mammographic evidence of malignancy. A result letter of this
screening mammogram will be mailed directly to the patient.

RECOMMENDATION:
Screening mammogram in one year. (Code:[id number])

BI-RADS CATEGORY  1: Negative.

## 2021-09-08 ENCOUNTER — Ambulatory Visit: Payer: Medicaid Other

## 2021-09-13 ENCOUNTER — Ambulatory Visit: Payer: Medicaid Other | Admitting: Obstetrics & Gynecology

## 2021-09-13 ENCOUNTER — Encounter: Payer: Self-pay | Admitting: Obstetrics & Gynecology

## 2021-09-13 ENCOUNTER — Other Ambulatory Visit: Payer: Self-pay

## 2021-09-13 VITALS — BP 125/96 | HR 76 | Ht 66.0 in | Wt 185.0 lb

## 2021-09-13 DIAGNOSIS — Z01419 Encounter for gynecological examination (general) (routine) without abnormal findings: Secondary | ICD-10-CM

## 2021-09-13 DIAGNOSIS — Z1211 Encounter for screening for malignant neoplasm of colon: Secondary | ICD-10-CM

## 2021-09-13 NOTE — Progress Notes (Signed)
? ? ?GYNECOLOGY ANNUAL PREVENTATIVE CARE ENCOUNTER NOTE ? ?History:    ? Jean Melton is a 61 y.o. [id number] female with history of hysterectomy for benign indications here for a routine annual gynecologic exam.  Current complaints: none.   Denies abnormal vaginal bleeding, discharge, pelvic pain, problems with intercourse or other gynecologic concerns.  ?  ?Gynecologic History ?No LMP recorded. Patient has had a hysterectomy. ?Last Mammogram: 09/07/2020.  Result was normal ?Last Colonoscopy: 02/02/2012.  Result was normal ? ?Obstetric History ?OB History  ?Gravida Para Term Preterm AB Living  ?4 4 4     5   ?SAB IAB Ectopic Multiple Live Births  ?      1 5  ?  ?# Outcome Date GA Lbr Len/2nd Weight Sex Delivery Anes PTL Lv  ?4A Term 1992 [redacted]w[redacted]d   M  EPI N LIV  ?4B Term 1992 [redacted]w[redacted]d   M  EPI N LIV  ?3 Term 1991 [redacted]w[redacted]d   M CS-Classical EPI N LIV  ?2 Term 1986 [redacted]w[redacted]d   F  EPI N LIV  ?1 Term 1985 [redacted]w[redacted]d   M  EPI N LIV  ? ? ?Past Medical History:  ?Diagnosis Date  ? Asthma   ? B12 deficiency   ? Bipolar 2 disorder ([place])   ? Depression   ? Diabetes ([place])   ? GERD (gastroesophageal reflux disease)   ? Heart failure ([place])   ? Hypercholesterolemia   ? Hyperosmolarity due to secondary diabetes ([place]) 09/11/2011  ? Hypertension   ? Mixed incontinence   ? Non-ischemic cardiomyopathy ([place])   ? Osteoarthritis   ? knees  ? ? ?Past Surgical History:  ?Procedure Laterality Date  ? ABDOMINAL HYSTERECTOMY    ? ABDOMINAL SURGERY    ? CATARACT EXTRACTION    ? CESAREAN SECTION    ? CHOLECYSTECTOMY    ? COLONOSCOPY  02/02/2012  ? Procedure: COLONOSCOPY;  Surgeon: Winfield Cunas., MD;  Location: [facility];  Service: Endoscopy;  Laterality: N/A;  ? EYE SURGERY    ? SKIN BIOPSY    ? STOMACH SURGERY    ? ? ?Current Outpatient Medications on File Prior to Visit  ?Medication Sig Dispense Refill  ? albuterol (VENTOLIN HFA) 108 (90 Base) MCG/ACT inhaler Inhale 2 puffs into the lungs every 4 (four) hours as needed for wheezing.     ? ARIPiprazole  (ABILIFY) 10 MG tablet Take 10 mg by mouth daily.    ? aspirin 81 MG tablet Take 81 mg by mouth daily.    ? atenolol (TENORMIN) 50 MG tablet Take 2 tablets by mouth twice daily 360 tablet 0  ? cetirizine (ZYRTEC) 10 MG tablet Take 10 mg by mouth daily as needed for allergies.     ? cycloSPORINE (RESTASIS) 0.05 % ophthalmic emulsion Place 1 drop into both eyes 2 (two) times daily.    ? FARXIGA 5 MG TABS tablet Take 5 mg by mouth daily.    ? Ferrous Sulfate (IRON) 325 (65 Fe) MG TABS Take 325 mg by mouth daily.     ? fluticasone (FLONASE) 50 MCG/ACT nasal spray Place 1 spray into the nose daily as needed for allergies.     ? Fluticasone-Salmeterol (ADVAIR) 250-50 MCG/DOSE AEPB Inhale 1 puff into the lungs 2 (two) times daily.    ? glucose blood test strip USE TO CHECK BLOOD SUGAR TWO TIMES DAILY    ? Lancets (ACCU-CHEK MULTICLIX) lancets 1 each by Misc.(Non-Drug; Combo Route) route daily.    ?  LORazepam (ATIVAN) 0.5 MG tablet Take 1 tablet by mouth daily.    ? losartan-hydrochlorothiazide (HYZAAR) 100-25 MG tablet Take 1 tablet by mouth once daily 90 tablet 0  ? methocarbamol (ROBAXIN) 500 MG tablet Take 500 mg by mouth every 8 (eight) hours as needed.    ? Multiple Vitamin (MULTIVITAMIN) capsule Take 1 capsule by mouth daily.    ? omeprazole (PRILOSEC) 20 MG capsule Take 2 capsules (40 mg total) by mouth daily. (Patient taking differently: Take 20 mg by mouth daily.) 60 capsule 1  ? rosuvastatin (CRESTOR) 10 MG tablet Take 10 mg by mouth at bedtime.     ? sertraline (ZOLOFT) 100 MG tablet Take 100 mg by mouth 2 (two) times daily.    ? sitaGLIPtin-metformin (JANUMET) 50-1000 MG tablet Take 1 tablet by mouth 2 (two) times daily with a meal.     ? solifenacin (VESICARE) 5 MG tablet Take 5 mg by mouth daily.    ? spironolactone (ALDACTONE) 25 MG tablet Take 1 tablet by mouth once daily 90 tablet 0  ? traMADol (ULTRAM) 50 MG tablet Take 1 tablet (50 mg total) by mouth every 12 (twelve) hours as needed. (Patient not  taking: Reported on 09/13/2021) 30 tablet 0  ? [DISCONTINUED] buPROPion (WELLBUTRIN XL) 150 MG 24 hr tablet Take 150 mg by mouth daily.      ? ?No current facility-administered medications on file prior to visit.  ? ? ?Allergies  ?Allergen Reactions  ? Ace Inhibitors   ?  cough  ? Pollen Extract Other (See Comments)  ? Sulfamethoxazole-Trimethoprim Other (See Comments)  ?  Hospital thinks that this is what caused the the GI bleed ?Hospital thinks that this is what caused the the GI bleed ?  ? ? ?Social History:  reports that she has never smoked. She has never used smokeless tobacco. She reports current alcohol use. She reports that she does not use drugs. ? ?Family History  ?Problem Relation Age of Onset  ? Diabetes Father   ? Hypertension Father   ? Heart failure Father   ? ? ?The following portions of the patient's history were reviewed and updated as appropriate: allergies, current medications, past family history, past medical history, past social history, past surgical history and problem list. ? ?Review of Systems ?Pertinent items noted in HPI and remainder of comprehensive ROS otherwise negative. ? ?Physical Exam:  ?BP (!) 125/96   Pulse 76   Ht 5\' 6"  (1.676 m)   Wt 185 lb (83.9 kg)   BMI 29.86 kg/m?  ?CONSTITUTIONAL: Well-developed, well-nourished female in no acute distress.  ?HENT:  Normocephalic, atraumatic, External right and left ear normal.  ?EYES: Conjunctivae and EOM are normal. Pupils are equal, round, and reactive to light. No scleral icterus.  ?NECK: Normal range of motion, supple, no masses.  Normal thyroid.  ?SKIN: Skin is warm and dry. No rash noted. Not diaphoretic. No erythema. No pallor. ?MUSCULOSKELETAL: Normal range of motion. No tenderness.  No cyanosis, clubbing, or edema. ?NEUROLOGIC: Alert and oriented to person, place, and time. Normal reflexes, muscle tone coordination.  ?PSYCHIATRIC: Normal mood and affect. Normal behavior. Normal judgment and thought content. ?CARDIOVASCULAR:  Normal heart rate noted, regular rhythm ?RESPIRATORY: Clear to auscultation bilaterally. Effort and breath sounds normal, no problems with respiration noted. ?BREASTS: Symmetric in size. No masses, tenderness, skin changes, nipple drainage, or lymphadenopathy bilaterally. Performed in the presence of a chaperone. ?ABDOMEN: Soft, no distention noted.  No tenderness, rebound or guarding.  ?PELVIC: Normal appearing  external genitalia and urethral meatus; normal appearing vaginal mucosa and vaginal cuff. Very mild atrophy noted.  No abnormal vaginal discharge noted.  No other palpable masses, no adnexal tenderness.  Performed in the presence of a chaperone. ?  ?Assessment and Plan:  ?   ?1. Colon cancer screening ?Referral made to Gastroenterology for colonoscopy ?- Ambulatory referral to Gastroenterology ? ?2. Women's annual routine gynecological examination ?Normal examination findings. ?Mammogram already scheduled ?Routine preventative health maintenance measures emphasized. ?Please refer to After Visit Summary for other counseling recommendations.  ?   ? ?Verita Schneiders, MD, FACOG ?Obstetrician Social research officer, government, Faculty Practice ?[facility] ? ?

## 2021-09-27 ENCOUNTER — Ambulatory Visit: Payer: Medicaid Other

## 2021-10-12 ENCOUNTER — Ambulatory Visit
Admission: RE | Admit: 2021-10-12 | Discharge: 2021-10-12 | Disposition: A | Discharge status: Home / Self Care | Payer: Medicaid Other | Source: Ambulatory Visit | Attending: Nurse Practitioner | Admitting: Nurse Practitioner

## 2021-10-12 DIAGNOSIS — Z1231 Encounter for screening mammogram for malignant neoplasm of breast: Secondary | ICD-10-CM

## 2021-10-30 ENCOUNTER — Other Ambulatory Visit: Payer: Self-pay | Admitting: Cardiology

## 2021-10-30 DIAGNOSIS — I5032 Chronic diastolic (congestive) heart failure: Secondary | ICD-10-CM

## 2021-10-30 DIAGNOSIS — I10 Essential (primary) hypertension: Secondary | ICD-10-CM

## 2021-11-05 ENCOUNTER — Other Ambulatory Visit: Payer: Self-pay | Admitting: Cardiology

## 2021-11-05 DIAGNOSIS — I5042 Chronic combined systolic (congestive) and diastolic (congestive) heart failure: Secondary | ICD-10-CM

## 2021-11-05 DIAGNOSIS — I10 Essential (primary) hypertension: Secondary | ICD-10-CM

## 2021-11-20 ENCOUNTER — Other Ambulatory Visit: Payer: Self-pay | Admitting: Cardiology

## 2021-11-20 DIAGNOSIS — I5042 Chronic combined systolic (congestive) and diastolic (congestive) heart failure: Secondary | ICD-10-CM

## 2021-11-20 DIAGNOSIS — I10 Essential (primary) hypertension: Secondary | ICD-10-CM

## 2021-12-03 ENCOUNTER — Encounter: Payer: Self-pay | Admitting: Cardiology

## 2021-12-03 ENCOUNTER — Ambulatory Visit: Payer: Medicaid Other | Admitting: Cardiology

## 2021-12-03 VITALS — BP 112/75 | HR 68 | Temp 97.3°F | Resp 16 | Ht 66.0 in | Wt 176.0 lb

## 2021-12-03 DIAGNOSIS — I10 Essential (primary) hypertension: Secondary | ICD-10-CM

## 2021-12-03 DIAGNOSIS — E1122 Type 2 diabetes mellitus with diabetic chronic kidney disease: Secondary | ICD-10-CM

## 2021-12-03 DIAGNOSIS — E782 Mixed hyperlipidemia: Secondary | ICD-10-CM

## 2021-12-03 DIAGNOSIS — R0789 Other chest pain: Secondary | ICD-10-CM

## 2021-12-03 NOTE — Progress Notes (Signed)
Primary Physician/Referring:  Berkley Harvey, NP  Patient ID: Jean Melton, female    DOB: [birth year], 61 y.o.   MRN: [id number]  Chief Complaint  Patient presents with   Chest "pulling"   Fatigue   Follow-up   HPI:    Jean Melton  is a 61 y.o. with type II diabetes with stage IIIa chronic kidney disease, mixed hyperlipiedmia, and hypertension. In 2019 after a syncopal episode she was diagnosed with nonischemic cardiomyopathy and chronic systolic and diastolic heart failure.  With medical therapy on 03/12/2020, echocardiogram and revealed EF of 50-55%.  She was last seen in our office in 2021, she made an appointment to see [place] because she was started to have chest discomfort that started about 3 weeks ago.  Described as tightness in the middle of the chest, with or without exertion activity last a minute or 2.  About 3 weeks ago she was having frequent episodes, however over the past 1 week symptoms have improved and she has had about 1 episode to at most 2 episodes a week.  She has not tried to exercise or exert herself and she has been sedentary lately.  Past Medical History:  Diagnosis Date   Asthma    B12 deficiency    Bipolar 2 disorder ([place])    Depression    Diabetes ([place])    GERD (gastroesophageal reflux disease)    Heart failure (HCC)    Hypercholesterolemia    Hyperosmolarity due to secondary diabetes ([place]) 09/11/2011   Hypertension    Mixed incontinence    Non-ischemic cardiomyopathy (HCC)    Osteoarthritis    knees   Past Surgical History:  Procedure Laterality Date   ABDOMINAL HYSTERECTOMY     ABDOMINAL SURGERY     CATARACT EXTRACTION     CESAREAN SECTION     CHOLECYSTECTOMY     COLONOSCOPY  02/02/2012   Procedure: COLONOSCOPY;  Surgeon: Winfield Cunas., MD;  Location: [facility];  Service: Endoscopy;  Laterality: N/A;   EYE SURGERY     SKIN BIOPSY     STOMACH SURGERY     Family History  Problem Relation Age of Onset   Diabetes Father     Hypertension Father    Heart failure Father    Breast cancer Neg Hx     Social History   Tobacco Use   Smoking status: Never   Smokeless tobacco: Never  Substance Use Topics   Alcohol use: Yes    Comment: ocass   Marital Status: Single  ROS  Review of Systems  Cardiovascular:  Positive for chest pain. Negative for dyspnea on exertion and leg swelling.  Objective  Blood pressure 112/75, pulse 68, temperature (!) 97.3 F (36.3 C), temperature source Temporal, resp. rate 16, height 5' 6"  (1.676 m), weight 176 lb (79.8 kg), SpO2 97 %.     12/03/2021   10:06 AM 09/13/2021    8:23 AM 07/07/2021   10:02 AM  Vitals with BMI  Height 5' 6"  5' 6"  5' 6"   Weight 176 lbs 185 lbs 184 lbs 10 oz  BMI 28.42 17.00 17.49  Systolic 449 675 916  Diastolic 75 96 83  Pulse 68 76 72     Physical Exam Vitals reviewed.  Constitutional:      Comments: Overweight female in no acute distress  Neck:     Vascular: No JVD.  Cardiovascular:     Rate and Rhythm: Normal rate and regular rhythm.  Pulses: Intact distal pulses.     Heart sounds: Normal heart sounds, S1 normal and S2 normal. No murmur heard.   No gallop.  Pulmonary:     Effort: Pulmonary effort is normal. No respiratory distress.     Breath sounds: Normal breath sounds. No wheezing, rhonchi or rales.  Abdominal:     General: Bowel sounds are normal.     Palpations: Abdomen is soft.  Musculoskeletal:     Right lower leg: No edema.     Left lower leg: No edema.   Laboratory examination:   External labs:   Labs 07/20/2021:  BUN 19, creatinine 1.25, EGFR 49 mL, vitamin D 15.  A1c 7.1%.  Total cholesterol 201, triglycerides 191, HDL 55, LDL 108.  Labs 11/25/2020:  Hb 11.7/HCT 36.3, platelets 354.  AIC 5.9% 01/22/2020  CMP 10/17/2019: Potassium 3.6, BUN 14, Creatinine 0.97, glucose 121  CBC 07/18/2019: Hemoglobin 11.5 Hematocrit 34.9   Lipid Profile 07/18/2019: Total cholesterol 180, LDL 98, HDL 64, Triglycerides  91  Allergies   Allergies  Allergen Reactions   Ace Inhibitors     cough   Pollen Extract Other (See Comments)   Sulfamethoxazole-Trimethoprim Other (See Comments)    Hospital thinks that this is what caused the the GI bleed Hospital thinks that this is what caused the the GI bleed     Final Medications at End of Visit    Current Outpatient Medications:    albuterol (VENTOLIN HFA) 108 (90 Base) MCG/ACT inhaler, Inhale 2 puffs into the lungs every 4 (four) hours as needed for wheezing. , Disp: , Rfl:    aspirin 81 MG tablet, Take 81 mg by mouth daily., Disp: , Rfl:    atenolol (TENORMIN) 50 MG tablet, Take 2 tablets by mouth twice daily, Disp: 360 tablet, Rfl: 0   cetirizine (ZYRTEC) 10 MG tablet, Take 10 mg by mouth daily as needed for allergies. , Disp: , Rfl:    cycloSPORINE (RESTASIS) 0.05 % ophthalmic emulsion, Place 1 drop into both eyes 2 (two) times daily., Disp: , Rfl:    FARXIGA 5 MG TABS tablet, Take 5 mg by mouth daily., Disp: , Rfl:    fluticasone (FLONASE) 50 MCG/ACT nasal spray, Place 1 spray into the nose daily as needed for allergies. , Disp: , Rfl:    Fluticasone-Salmeterol (ADVAIR) 250-50 MCG/DOSE AEPB, Inhale 1 puff into the lungs 2 (two) times daily., Disp: , Rfl:    glucose blood test strip, USE TO CHECK BLOOD SUGAR TWO TIMES DAILY, Disp: , Rfl:    Lancets (ACCU-CHEK MULTICLIX) lancets, 1 each by Misc.(Non-Drug; Combo Route) route daily., Disp: , Rfl:    LORazepam (ATIVAN) 0.5 MG tablet, Take 1 tablet by mouth daily., Disp: , Rfl:    losartan-hydrochlorothiazide (HYZAAR) 100-25 MG tablet, Take 1 tablet by mouth once daily, Disp: 90 tablet, Rfl: 0   methocarbamol (ROBAXIN) 500 MG tablet, Take 500 mg by mouth every 8 (eight) hours as needed., Disp: , Rfl:    Multiple Vitamin (MULTIVITAMIN) capsule, Take 1 capsule by mouth daily., Disp: , Rfl:    omeprazole (PRILOSEC) 20 MG capsule, Take 2 capsules (40 mg total) by mouth daily. (Patient taking differently: Take 20 mg  by mouth daily.), Disp: 60 capsule, Rfl: 1   rosuvastatin (CRESTOR) 10 MG tablet, Take 10 mg by mouth at bedtime. , Disp: , Rfl:    sertraline (ZOLOFT) 50 MG tablet, Take 100 mg by mouth daily as needed., Disp: , Rfl:    sitaGLIPtin-metformin (JANUMET) 50-1000  MG tablet, Take 1 tablet by mouth 2 (two) times daily with a meal. , Disp: , Rfl:    solifenacin (VESICARE) 5 MG tablet, Take 5 mg by mouth daily., Disp: , Rfl:    spironolactone (ALDACTONE) 25 MG tablet, Take 1 tablet by mouth once daily, Disp: 90 tablet, Rfl: 0   Ferrous Sulfate (IRON) 325 (65 Fe) MG TABS, Take 325 mg by mouth daily.  (Patient not taking: Reported on 12/03/2021), Disp: , Rfl:    Radiology:   No results found.  Cardiac Studies:   Exercise myoview stress 10/06/2017:  1. The patient performed treadmill exercise using a Bruce protocol, completing 6 minutes. The patient completed an estimated workload of 7.05 METS, reaching 99% of the maximum predicted heart rate. Normal hemodynamic response. Fair exercise capacity. No stress symptoms reported. No ischemic changes seen on stress electrocardiogram. 2. The overall quality of the study is excellent. There is no evidence of abnormal lung activity. Stress and rest SPECT images demonstrate homogeneous tracer distribution throughout the myocardium. Gated SPECT imaging reveals normal myocardial thickening and wall motion. The left ventricular ejection fraction was normal (50%).   3. Low risk study.   Holter Monitor 24 hours 11/09/2017: No symptoms reported. Predominant rhythm is sinus tachycardia. Patient had 18 hours of sinus tachycardia, maximum heart rate 152 bpm. Average heart rate 106 bpm. Occasional PVCs.  Echocardiogram 93/23/5573 Normal LV systolic function with visual EF 50-55%. Left ventricle cavity  is normal in size. Moderate left ventricular hypertrophy. Normal global wall motion. Doppler evidence of grade II diastolic dysfunction,  indeterminate LAP.  Left atrial cavity  is mildly dilated.  Moderate mitral valve leaflet thickening. Normal mitral valve leaflet mobility. No evidence of mitral stenosis. Mild (Grade I) mitral regurgitation.  Mild tricuspid regurgitation.  Mild pulmonic regurgitation.  Insignificant pericardial effusion.  Compared to prior study 11/15/2018: LVEF is improved from 40-45% to 50-55%, Grade 1 to Grade 2 DD, no significant change in valvular heart disease.    EKG   EKG 12/03/2021: Normal sinus rhythm at rate of 68 bpm, left atrial enlargement, otherwise normal EKG.  Compared to 07/07/2021, nonspecific T abnormality in the anterior leads not present.  Assessment     ICD-10-CM   1. Chest discomfort  R07.89 EKG 12-Lead    PCV MYOCARDIAL PERFUSION WO LEXISCAN    2. Primary hypertension  I10     3. Type 2 diabetes mellitus with stage 3a chronic kidney disease, without long-term current use of insulin (HCC)  E11.22    N18.31     4. Mixed hyperlipidemia  E78.2        Medications Discontinued During This Encounter  Medication Reason   ARIPiprazole (ABILIFY) 10 MG tablet    sertraline (ZOLOFT) 50 MG tablet    traMADol (ULTRAM) 50 MG tablet      No orders of the defined types were placed in this encounter.   Recommendations:   Jean Melton is a 61 y.o. with type II diabetes with stage IIIa chronic kidney disease, mixed hyperlipiedmia, and hypertension. In 2019 after a syncopal episode she was diagnosed with nonischemic cardiomyopathy and chronic systolic and diastolic heart failure.  With medical therapy on 03/12/2020, echocardiogram and revealed EF of 50-55%.  She presents here on a sick visit, states that about 2 to 3 weeks ago she started having chest tightness in the middle of the chest lasting a minute or 2, not necessarily associated with exertion but on questioning, she is markedly sedentary.  She denies  any other associated symptoms.  She is extremely high risk, in view of diabetes with stage III chronic kidney disease,  mixed hyperlipidemia which is still not controlled, will proceed with exercise nuclear stress test and would also like to repeat echocardiogram to follow-up on prior cardiomyopathy to make sure that the EF has remained stable as it was low normal previously.  Blood pressure is well controlled, she is on appropriate medical therapy with beta-blocker and also ARB.  No clinical evidence of heart failure.  She has mixed hyperlipidemia is uncontrolled, LDL goal <70.  However upon review of her labs, she has severe vitamin D deficiency.  I will request Ms. Eldridge Abrahams, PA-C to replete vitamin D and also she was previously on vitamin B-12 injections on a monthly basis, she does have anemia, she has not been on B12 injections for quite a while.  This may need to be followed up as well, will wait before uptitrating her statins.  I would like to see her back in 4 to 6 weeks for follow-up.  Chest pain symptoms appear to be atypical however angina pectoralis cannot be excluded completely in view of her risk factors. I personally discussed with Ms. Eldridge Abrahams, PA-C   Adrian Prows, PA-C 12/03/2021, 11:20 AM Office: [phone]

## 2021-12-27 ENCOUNTER — Other Ambulatory Visit: Payer: Medicaid Other

## 2022-01-10 ENCOUNTER — Ambulatory Visit: Payer: Medicaid Other

## 2022-01-10 DIAGNOSIS — R0789 Other chest pain: Secondary | ICD-10-CM

## 2022-01-25 ENCOUNTER — Other Ambulatory Visit: Payer: Self-pay | Admitting: Student

## 2022-01-25 DIAGNOSIS — I5032 Chronic diastolic (congestive) heart failure: Secondary | ICD-10-CM

## 2022-01-25 DIAGNOSIS — I10 Essential (primary) hypertension: Secondary | ICD-10-CM

## 2022-01-27 ENCOUNTER — Ambulatory Visit: Payer: Medicaid Other | Admitting: Cardiology

## 2022-01-27 ENCOUNTER — Encounter: Payer: Self-pay | Admitting: Cardiology

## 2022-01-27 VITALS — BP 126/78 | HR 70 | Temp 97.6°F | Resp 16 | Ht 66.0 in | Wt 177.0 lb

## 2022-01-27 DIAGNOSIS — I10 Essential (primary) hypertension: Secondary | ICD-10-CM

## 2022-01-27 DIAGNOSIS — E559 Vitamin D deficiency, unspecified: Secondary | ICD-10-CM

## 2022-01-27 DIAGNOSIS — R0789 Other chest pain: Secondary | ICD-10-CM

## 2022-01-27 DIAGNOSIS — E782 Mixed hyperlipidemia: Secondary | ICD-10-CM

## 2022-01-27 DIAGNOSIS — K219 Gastro-esophageal reflux disease without esophagitis: Secondary | ICD-10-CM

## 2022-01-27 MED ORDER — OMEPRAZOLE 20 MG PO CPDR: 40.0000 mg | DELAYED_RELEASE_CAPSULE | Freq: Every day | ORAL | 1 refills | Status: DC | PRN

## 2022-01-27 MED ORDER — EZETIMIBE 10 MG PO TABS: 10.0000 mg | ORAL_TABLET | Freq: Every day | ORAL | 0 refills | Status: DC

## 2022-01-27 MED ORDER — FAMOTIDINE 20 MG PO TABS: 20.0000 mg | ORAL_TABLET | Freq: Two times a day (BID) | ORAL | 0 refills | Status: AC

## 2022-01-27 NOTE — Progress Notes (Signed)
Primary Physician/Referring:  Berkley Harvey, NP  Patient ID: Jean Melton, female    DOB: 12/06/1960, 61 y.o.   MRN: [id number]  Chief Complaint  Patient presents with   Chest Pain   Follow-up   Results    Stress test   HPI:    Jean Melton  is a 61 y.o. with type II diabetes with stage IIIa chronic kidney disease, mixed hyperlipiedmia, and hypertension. In 2019 after a syncopal episode she was diagnosed with nonischemic cardiomyopathy and chronic systolic and diastolic heart failure.  With medical therapy on 03/12/2020, echocardiogram and revealed EF of 50-55%.  I had last seen her on 12/03/2021 for chest pain, and in view of underlying risk factors she underwent Lexiscan nuclear stress test presents for follow-up.  No new symptomatology, she still occasionally continues to have chest heaviness.  No rest pain.  Past Medical History:  Diagnosis Date   Asthma    B12 deficiency    Bipolar 2 disorder ([place])    Depression    Diabetes ([place])    GERD (gastroesophageal reflux disease)    Heart failure (HCC)    Hypercholesterolemia    Hyperosmolarity due to secondary diabetes ([place]) 09/11/2011   Hypertension    Mixed incontinence    Non-ischemic cardiomyopathy (HCC)    Osteoarthritis    knees   Past Surgical History:  Procedure Laterality Date   ABDOMINAL HYSTERECTOMY     ABDOMINAL SURGERY     CATARACT EXTRACTION     CESAREAN SECTION     CHOLECYSTECTOMY     COLONOSCOPY  02/02/2012   Procedure: COLONOSCOPY;  Surgeon: Winfield Cunas., MD;  Location: [facility];  Service: Endoscopy;  Laterality: N/A;   EYE SURGERY     SKIN BIOPSY     STOMACH SURGERY     Family History  Problem Relation Age of Onset   Diabetes Father    Hypertension Father    Heart failure Father    Breast cancer Neg Hx     Social History   Tobacco Use   Smoking status: Never   Smokeless tobacco: Never  Substance Use Topics   Alcohol use: Yes    Comment: ocass   Marital Status: Single  ROS   Review of Systems  Cardiovascular:  Positive for chest pain. Negative for dyspnea on exertion and leg swelling.   Objective  Blood pressure 126/78, pulse 70, temperature 97.6 F (36.4 C), resp. rate 16, height 5' 6"  (1.676 m), weight 177 lb (80.3 kg), SpO2 98 %.     01/27/2022    9:05 AM 12/03/2021   10:06 AM 09/13/2021    8:23 AM  Vitals with BMI  Height 5' 6"  5' 6"  5' 6"   Weight 177 lbs 176 lbs 185 lbs  BMI 28.58 05.39 76.73  Systolic 419 379 024  Diastolic 78 75 96  Pulse 70 68 76     Physical Exam Vitals reviewed.  Constitutional:      Comments: Overweight female in no acute distress  Neck:     Vascular: No JVD.  Cardiovascular:     Rate and Rhythm: Normal rate and regular rhythm.     Pulses: Intact distal pulses.     Heart sounds: Normal heart sounds, S1 normal and S2 normal. No murmur heard.    No gallop.  Pulmonary:     Effort: Pulmonary effort is normal. No respiratory distress.     Breath sounds: Normal breath sounds. No wheezing, rhonchi or rales.  Abdominal:     General: Bowel sounds are normal.     Palpations: Abdomen is soft.  Musculoskeletal:     Right lower leg: No edema.     Left lower leg: No edema.    Laboratory examination:   External labs:   Labs 07/20/2021:  BUN 19, creatinine 1.25, EGFR 49 mL, vitamin D 15.  A1c 7.1%.  Total cholesterol 201, triglycerides 191, HDL 55, LDL 108.  Labs 11/25/2020:  Hb 11.7/HCT 36.3, platelets 354.  AIC 5.9% 01/22/2020  CMP 10/17/2019: Potassium 3.6, BUN 14, Creatinine 0.97, glucose 121  CBC 07/18/2019: Hemoglobin 11.5 Hematocrit 34.9   Lipid Profile 07/18/2019: Total cholesterol 180, LDL 98, HDL 64, Triglycerides 91  Allergies   Allergies  Allergen Reactions   Ace Inhibitors     cough   Pollen Extract Other (See Comments)   Sulfamethoxazole-Trimethoprim Other (See Comments)    Hospital thinks that this is what caused the the GI bleed Hospital thinks that this is what caused the the GI bleed      Final Medications at End of Visit    Current Outpatient Medications:    albuterol (VENTOLIN HFA) 108 (90 Base) MCG/ACT inhaler, Inhale 2 puffs into the lungs every 4 (four) hours as needed for wheezing. , Disp: , Rfl:    aspirin 81 MG tablet, Take 81 mg by mouth daily., Disp: , Rfl:    atenolol (TENORMIN) 50 MG tablet, Take 2 tablets by mouth twice daily, Disp: 360 tablet, Rfl: 0   cetirizine (ZYRTEC) 10 MG tablet, Take 10 mg by mouth daily as needed for allergies. , Disp: , Rfl:    cycloSPORINE (RESTASIS) 0.05 % ophthalmic emulsion, Place 1 drop into both eyes 2 (two) times daily., Disp: , Rfl:    ezetimibe (ZETIA) 10 MG tablet, Take 1 tablet (10 mg total) by mouth daily., Disp: 90 tablet, Rfl: 0   famotidine (PEPCID) 20 MG tablet, Take 1 tablet (20 mg total) by mouth 2 (two) times daily., Disp: 180 tablet, Rfl: 0   FARXIGA 5 MG TABS tablet, Take 5 mg by mouth daily., Disp: , Rfl:    Ferrous Sulfate (IRON) 325 (65 Fe) MG TABS, Take 325 mg by mouth daily., Disp: , Rfl:    fluticasone (FLONASE) 50 MCG/ACT nasal spray, Place 1 spray into the nose daily as needed for allergies. , Disp: , Rfl:    Fluticasone-Salmeterol (ADVAIR) 250-50 MCG/DOSE AEPB, Inhale 1 puff into the lungs 2 (two) times daily., Disp: , Rfl:    glucose blood test strip, USE TO CHECK BLOOD SUGAR TWO TIMES DAILY, Disp: , Rfl:    Lancets (ACCU-CHEK MULTICLIX) lancets, 1 each by Misc.(Non-Drug; Combo Route) route daily., Disp: , Rfl:    LORazepam (ATIVAN) 0.5 MG tablet, Take 1 tablet by mouth daily., Disp: , Rfl:    losartan-hydrochlorothiazide (HYZAAR) 100-25 MG tablet, Take 1 tablet by mouth once daily, Disp: 90 tablet, Rfl: 0   methocarbamol (ROBAXIN) 500 MG tablet, Take 500 mg by mouth every 8 (eight) hours as needed., Disp: , Rfl:    Multiple Vitamin (MULTIVITAMIN) capsule, Take 1 capsule by mouth daily., Disp: , Rfl:    REXULTI 1 MG TABS tablet, Take 1 mg by mouth at bedtime., Disp: , Rfl:    rosuvastatin (CRESTOR) 10 MG  tablet, Take 10 mg by mouth at bedtime. , Disp: , Rfl:    sertraline (ZOLOFT) 50 MG tablet, Take 100 mg by mouth daily as needed., Disp: , Rfl:    sitaGLIPtin-metformin (JANUMET) 50-1000  MG tablet, Take 1 tablet by mouth 2 (two) times daily with a meal. , Disp: , Rfl:    solifenacin (VESICARE) 5 MG tablet, Take 5 mg by mouth daily., Disp: , Rfl:    spironolactone (ALDACTONE) 25 MG tablet, Take 1 tablet by mouth once daily, Disp: 90 tablet, Rfl: 0   Vitamin D, Ergocalciferol, (DRISDOL) 1.25 MG (50000 UNIT) CAPS capsule, Take by mouth., Disp: , Rfl:    omeprazole (PRILOSEC) 20 MG capsule, Take 2 capsules (40 mg total) by mouth daily as needed (k through heart burn)., Disp: 60 capsule, Rfl: 1   Radiology:   No results found.  Cardiac Studies:    Holter Monitor 24 hours 11/09/2017: No symptoms reported. Predominant rhythm is sinus tachycardia. Patient had 18 hours of sinus tachycardia, maximum heart rate 152 bpm. Average heart rate 106 bpm. Occasional PVCs.  Echocardiogram 51/76/1607 Normal LV systolic function with visual EF 50-55%. Left ventricle cavity  is normal in size. Moderate left ventricular hypertrophy. Normal global wall motion. Doppler evidence of grade II diastolic dysfunction,  indeterminate LAP.  Left atrial cavity is mildly dilated.  Moderate mitral valve leaflet thickening. Normal mitral valve leaflet mobility. No evidence of mitral stenosis. Mild (Grade I) mitral regurgitation.  Mild tricuspid regurgitation.  Mild pulmonic regurgitation.  Insignificant pericardial effusion.  Compared to prior study 11/15/2018: LVEF is improved from 40-45% to 50-55%, Grade 1 to Grade 2 DD, no significant change in valvular heart disease.   PCV MYOCARDIAL PERFUSION WITH LEXISCAN 01/10/2022  Narrative Lexiscan/modified Bruce nuclear stress test 01/10/2022: 1 Day Rest/Stress protocol. Exercise time 4 minutes, achieved 2.27 METS, 68% of age-predicted maximum heart rate. Stress EKG is  non-diagnostic target heart rate not achieved/pharmacologic stress. Normal myocardial perfusion without evidence of reversible ischemia or prior infarct. Left ventricular size normal, calculated LVEF 57%, wall thickness preserved, no regional wall motion abnormalities. Prior study 10/06/2017 exercise time 6 minutes, achieved 7.05 METS, 99% of age-predicted maximum heart rate, normal myocardial perfusion, calculated LVEF 50%. Low risk study.    EKG   EKG 12/03/2021: Normal sinus rhythm at rate of 68 bpm, left atrial enlargement, otherwise normal EKG.  Compared to 07/07/2021, nonspecific T abnormality in the anterior leads not present.  Assessment     ICD-10-CM   1. Chest discomfort  R07.89 CANCELED: EKG 12-Lead    2. Primary hypertension  I10     3. Mixed hyperlipidemia  E78.2 ezetimibe (ZETIA) 10 MG tablet    4. Hypovitaminosis D  E55.9     5. Gastroesophageal reflux disease without esophagitis  K21.9 famotidine (PEPCID) 20 MG tablet    omeprazole (PRILOSEC) 20 MG capsule       Medications Discontinued During This Encounter  Medication Reason   omeprazole (PRILOSEC) 20 MG capsule      Meds ordered this encounter  Medications   famotidine (PEPCID) 20 MG tablet    Sig: Take 1 tablet (20 mg total) by mouth 2 (two) times daily.    Dispense:  180 tablet    Refill:  0    Refills to Eldridge Abrahams, NP   omeprazole (PRILOSEC) 20 MG capsule    Sig: Take 2 capsules (40 mg total) by mouth daily as needed (k through heart burn).    Dispense:  60 capsule    Refill:  1   ezetimibe (ZETIA) 10 MG tablet    Sig: Take 1 tablet (10 mg total) by mouth daily.    Dispense:  90 tablet    Refill:  0  Refills to Eldridge Abrahams    Recommendations:   Jean Melton is a 61 y.o. with type II diabetes with stage IIIa chronic kidney disease, mixed hyperlipiedmia, and hypertension. In 2019 after a syncopal episode she was diagnosed with nonischemic cardiomyopathy and chronic systolic and diastolic heart  failure.  With medical therapy on 03/12/2020, echocardiogram and revealed EF of 50-55%.  I had last seen her on 12/03/2021 for chest pain, and in view of underlying risk factors she underwent Lexiscan nuclear stress test presents for follow-up.  I suspect her chest pain is related to GERD.  She is presently taking omeprazole 20 mg daily advised her to take it on empty stomach however as she has been taking this on a long-term basis, I will try Pepcid 20 mg twice daily and she can continue to take omeprazole on a as needed basis as well.  Reviewed the mixed hyperlipidemia, LDL goal is closer to 70 in view of diabetes mellitus.  I have added Zetia 10 mg daily, I will request Eldridge Abrahams, NP to check her lipids in 4 to 6 weeks and follow-up on this.  Blood pressure is well controlled.  Her stress test is low risk.  She has had cardiomyopathy previously and had severe LV systolic dysfunction which had improved by medical therapy, ejection fraction by nuclear stress test also reveals preserved LVEF at 50% although low normal.  Overall I am very pleased with the results, I will see him back on a as needed basis.  Last visit I discussed regarding hypovitaminosis D, she is now back on supplements.   Adrian Prows, PA-C 01/27/2022, 10:15 AM Office: [phone]

## 2022-03-30 ENCOUNTER — Ambulatory Visit: Payer: Medicaid Other | Admitting: Podiatry

## 2022-03-30 DIAGNOSIS — M722 Plantar fascial fibromatosis: Secondary | ICD-10-CM

## 2022-03-30 NOTE — Progress Notes (Signed)
Subjective:  Patient ID: Jean Melton, female    DOB: [birth year],  MRN: [id number]  Chief Complaint  Patient presents with   Foot Pain    Heel pain    61 y.o. female presents with the above complaint.  Patient presents with bilateral heel pain has progressive gotten worse hurts with ambulation hurts with pressure came out of nowhere started about a week ago.  She went to get it evaluated.  She denies any treatment options she has not seen any loss prior to seeing me for this   Review of Systems: Negative except as noted in the HPI. Denies N/V/F/Ch.  Past Medical History:  Diagnosis Date   Asthma    B12 deficiency    Bipolar 2 disorder ([place])    Depression    Diabetes ([place])    GERD (gastroesophageal reflux disease)    Heart failure ([facility])    Hypercholesterolemia    Hyperosmolarity due to secondary diabetes ([place]) 09/11/2011   Hypertension    Mixed incontinence    Non-ischemic cardiomyopathy ([facility])    Osteoarthritis    knees    Current Outpatient Medications:    albuterol (VENTOLIN HFA) 108 (90 Base) MCG/ACT inhaler, Inhale 2 puffs into the lungs every 4 (four) hours as needed for wheezing. , Disp: , Rfl:    aspirin 81 MG tablet, Take 81 mg by mouth daily., Disp: , Rfl:    atenolol (TENORMIN) 50 MG tablet, Take 2 tablets by mouth twice daily, Disp: 360 tablet, Rfl: 0   cetirizine (ZYRTEC) 10 MG tablet, Take 10 mg by mouth daily as needed for allergies. , Disp: , Rfl:    cycloSPORINE (RESTASIS) 0.05 % ophthalmic emulsion, Place 1 drop into both eyes 2 (two) times daily., Disp: , Rfl:    ezetimibe (ZETIA) 10 MG tablet, Take 1 tablet (10 mg total) by mouth daily., Disp: 90 tablet, Rfl: 0   famotidine (PEPCID) 20 MG tablet, Take 1 tablet (20 mg total) by mouth 2 (two) times daily., Disp: 180 tablet, Rfl: 0   FARXIGA 5 MG TABS tablet, Take 5 mg by mouth daily., Disp: , Rfl:    Ferrous Sulfate (IRON) 325 (65 Fe) MG TABS, Take 325 mg by mouth daily., Disp: , Rfl:    fluticasone  (FLONASE) 50 MCG/ACT nasal spray, Place 1 spray into the nose daily as needed for allergies. , Disp: , Rfl:    Fluticasone-Salmeterol (ADVAIR) 250-50 MCG/DOSE AEPB, Inhale 1 puff into the lungs 2 (two) times daily., Disp: , Rfl:    glucose blood test strip, USE TO CHECK BLOOD SUGAR TWO TIMES DAILY, Disp: , Rfl:    Lancets (ACCU-CHEK MULTICLIX) lancets, 1 each by Misc.(Non-Drug; Combo Route) route daily., Disp: , Rfl:    LORazepam (ATIVAN) 0.5 MG tablet, Take 1 tablet by mouth daily., Disp: , Rfl:    losartan-hydrochlorothiazide (HYZAAR) 100-25 MG tablet, Take 1 tablet by mouth once daily, Disp: 90 tablet, Rfl: 0   methocarbamol (ROBAXIN) 500 MG tablet, Take 500 mg by mouth every 8 (eight) hours as needed., Disp: , Rfl:    Multiple Vitamin (MULTIVITAMIN) capsule, Take 1 capsule by mouth daily., Disp: , Rfl:    omeprazole (PRILOSEC) 20 MG capsule, Take 2 capsules (40 mg total) by mouth daily as needed (k through heart burn)., Disp: 60 capsule, Rfl: 1   REXULTI 1 MG TABS tablet, Take 1 mg by mouth at bedtime., Disp: , Rfl:    rosuvastatin (CRESTOR) 10 MG tablet, Take 10 mg by mouth at bedtime. ,  Disp: , Rfl:    sertraline (ZOLOFT) 50 MG tablet, Take 100 mg by mouth daily as needed., Disp: , Rfl:    sitaGLIPtin-metformin (JANUMET) 50-1000 MG tablet, Take 1 tablet by mouth 2 (two) times daily with a meal. , Disp: , Rfl:    solifenacin (VESICARE) 5 MG tablet, Take 5 mg by mouth daily., Disp: , Rfl:    spironolactone (ALDACTONE) 25 MG tablet, Take 1 tablet by mouth once daily, Disp: 90 tablet, Rfl: 0   Vitamin D, Ergocalciferol, (DRISDOL) 1.25 MG (50000 UNIT) CAPS capsule, Take by mouth., Disp: , Rfl:   Social History   Tobacco Use  Smoking Status Never  Smokeless Tobacco Never    Allergies  Allergen Reactions   Ace Inhibitors     cough   Pollen Extract Other (See Comments)   Sulfamethoxazole-Trimethoprim Other (See Comments)    Hospital thinks that this is what caused the the GI  bleed Hospital thinks that this is what caused the the GI bleed    Objective:  There were no vitals filed for this visit. There is no height or weight on file to calculate BMI. Constitutional Well developed. Well nourished.  Vascular Dorsalis pedis pulses palpable bilaterally. Posterior tibial pulses palpable bilaterally. Capillary refill normal to all digits.  No cyanosis or clubbing noted. Pedal hair growth normal.  Neurologic Normal speech. Oriented to person, place, and time. Epicritic sensation to light touch grossly present bilaterally.  Dermatologic Nails well groomed and normal in appearance. No open wounds. No skin lesions.  Orthopedic: Normal joint ROM without pain or crepitus bilaterally. No visible deformities. Tender to palpation at the calcaneal tuber bilaterally. No pain with calcaneal squeeze bilaterally. Ankle ROM diminished range of motion bilaterally. Silfverskiold Test: positive bilaterally.   Radiographs: None  Assessment:   1. Plantar fasciitis, right   2. Plantar fasciitis, left    Plan:  Patient was evaluated and treated and all questions answered.  Plantar Fasciitis, bilaterally - XR reviewed as above.  - Educated on icing and stretching. Instructions given.  - Injection delivered to the plantar fascia as below. - DME: None - Pharmacologic management: None  Procedure: Injection Tendon/Ligament Location: Bilateral plantar fascia at the glabrous junction; medial approach. Skin Prep: alcohol Injectate: 0.5 cc 0.5% marcaine plain, 0.5 cc of 1% Lidocaine, 0.5 cc kenalog 10. Disposition: Patient tolerated procedure well. Injection site dressed with a band-aid.  No follow-ups on file.

## 2022-04-27 ENCOUNTER — Ambulatory Visit: Payer: Medicaid Other | Admitting: Podiatry

## 2022-05-11 ENCOUNTER — Other Ambulatory Visit: Payer: Self-pay

## 2022-05-11 ENCOUNTER — Emergency Department (HOSPITAL_COMMUNITY)
Admission: EM | Admit: 2022-05-11 | Discharge: 2022-05-11 | Disposition: A | Discharge status: Home / Self Care | Payer: Medicaid Other | Attending: Emergency Medicine | Admitting: Emergency Medicine

## 2022-05-11 ENCOUNTER — Encounter (HOSPITAL_COMMUNITY): Payer: Self-pay

## 2022-05-11 DIAGNOSIS — E119 Type 2 diabetes mellitus without complications: Secondary | ICD-10-CM | POA: Diagnosis not present

## 2022-05-11 DIAGNOSIS — J45909 Unspecified asthma, uncomplicated: Secondary | ICD-10-CM | POA: Diagnosis not present

## 2022-05-11 DIAGNOSIS — Z7982 Long term (current) use of aspirin: Secondary | ICD-10-CM | POA: Insufficient documentation

## 2022-05-11 DIAGNOSIS — Z7984 Long term (current) use of oral hypoglycemic drugs: Secondary | ICD-10-CM | POA: Diagnosis not present

## 2022-05-11 DIAGNOSIS — Z7951 Long term (current) use of inhaled steroids: Secondary | ICD-10-CM | POA: Diagnosis not present

## 2022-05-11 DIAGNOSIS — U071 COVID-19: Secondary | ICD-10-CM | POA: Insufficient documentation

## 2022-05-11 DIAGNOSIS — I951 Orthostatic hypotension: Secondary | ICD-10-CM

## 2022-05-11 DIAGNOSIS — I10 Essential (primary) hypertension: Secondary | ICD-10-CM | POA: Insufficient documentation

## 2022-05-11 DIAGNOSIS — Z79899 Other long term (current) drug therapy: Secondary | ICD-10-CM | POA: Insufficient documentation

## 2022-05-11 DIAGNOSIS — R112 Nausea with vomiting, unspecified: Secondary | ICD-10-CM | POA: Diagnosis present

## 2022-05-11 LAB — RESP PANEL BY RT-PCR (FLU A&B, COVID) ARPGX2
Influenza A by PCR: NEGATIVE
Influenza B by PCR: NEGATIVE
SARS Coronavirus 2 by RT PCR: POSITIVE — AB

## 2022-05-11 MED ORDER — ONDANSETRON 4 MG PO TBDP
4.0000 mg | ORAL_TABLET | Freq: Once | ORAL | Status: AC
  Administered 2022-05-11: 4 mg via ORAL
  Filled 2022-05-11: qty 1

## 2022-05-11 MED ORDER — SODIUM CHLORIDE 0.9 % IV BOLUS
1000.0000 mL | Freq: Once | INTRAVENOUS | Status: AC
  Administered 2022-05-11: 1000 mL via INTRAVENOUS

## 2022-05-11 MED ORDER — ONDANSETRON 4 MG PO TBDP: 4.0000 mg | ORAL_TABLET | Freq: Three times a day (TID) | ORAL | 0 refills | Status: DC | PRN

## 2022-05-11 NOTE — ED Triage Notes (Signed)
Vomiting is intermittent, still eating/drinking in between without issue.

## 2022-05-11 NOTE — ED Triage Notes (Signed)
Pt reports vomiting, body aches, fever, chills, sore throat starting 1 week ago. Pt reports feeling like throat was closing started yesterday. No breathing difficulties noted, no drooling, patient reports still able to eat and drink without issues.

## 2022-05-11 NOTE — ED Triage Notes (Signed)
Last episode emesis was 9pm last night.

## 2022-05-11 NOTE — ED Provider Notes (Signed)
[facility]-EMERGENCY DEPT Provider Note   CSN: [id number] Arrival date & time: 05/11/22  0350     History  Chief Complaint  Patient presents with   Vomiting    Jean Melton is a 61 y.o. female presents to the ER with complaint of vomiting, body aches, fever, chills and sore throat that began 1 week ago.  She reports that her throat felt like it was closing yesterday during an episode of vomiting, but she was able to eat and drink without issues or difficulty swallowing.  Last episode of vomiting was yesterday at 9pm.  She reports subjective fever and chills at home.  She states she is feeling better than when her symptoms first began.  Denies chest pain, shortness of breath, chest tightness, abdominal pain, diarrhea, dizziness, weakness.   RN reported patient had syncopal episode in waiting room.  Patient remembers walking back to her chair in the waiting room and just felt like she "may have walked past it and kept going" and woke up a few seconds later to a nurse standing over her.  She was not confused following event and remembers everything leading up to and immediately after.  She denies pain or injury.      Home Medications Prior to Admission medications   Medication Sig Start Date End Date Taking? Authorizing Provider  albuterol (VENTOLIN HFA) 108 (90 Base) MCG/ACT inhaler Inhale 2 puffs into the lungs every 4 (four) hours as needed for wheezing.  05/11/15   [provider]  aspirin 81 MG tablet Take 81 mg by mouth daily.    [provider]  atenolol (TENORMIN) 50 MG tablet Take 2 tablets by mouth twice daily 06/08/21   Cantwell, Celeste C, PA-C  cetirizine (ZYRTEC) 10 MG tablet Take 10 mg by mouth daily as needed for allergies.  05/11/15   [provider]  cycloSPORINE (RESTASIS) 0.05 % ophthalmic emulsion Place 1 drop into both eyes 2 (two) times daily. 05/12/15   [provider]  ezetimibe (ZETIA) 10 MG tablet Take 1  tablet (10 mg total) by mouth daily. 01/27/22 04/27/22  Yates Decamp, MD  famotidine (PEPCID) 20 MG tablet Take 1 tablet (20 mg total) by mouth 2 (two) times daily. 01/27/22   Yates Decamp, MD  FARXIGA 5 MG TABS tablet Take 5 mg by mouth daily. 06/05/21   [provider]  Ferrous Sulfate (IRON) 325 (65 Fe) MG TABS Take 325 mg by mouth daily.    [provider]  fluticasone (FLONASE) 50 MCG/ACT nasal spray Place 1 spray into the nose daily as needed for allergies.  05/18/18   [provider]  Fluticasone-Salmeterol (ADVAIR) 250-50 MCG/DOSE AEPB Inhale 1 puff into the lungs 2 (two) times daily. 05/11/15   [provider]  glucose blood test strip USE TO CHECK BLOOD SUGAR TWO TIMES DAILY 02/22/17   [provider]  Lancets (ACCU-CHEK MULTICLIX) lancets 1 each by Misc.(Non-Drug; Combo Route) route daily. 08/03/17   [provider]  LORazepam (ATIVAN) 0.5 MG tablet Take 1 tablet by mouth daily. 07/01/20   [provider]  losartan-hydrochlorothiazide Mauri Reading) 100-25 MG tablet Take 1 tablet by mouth once daily 11/22/21   Yates Decamp, MD  methocarbamol (ROBAXIN) 500 MG tablet Take 500 mg by mouth every 8 (eight) hours as needed. 11/15/19   [provider]  Multiple Vitamin (MULTIVITAMIN) capsule Take 1 capsule by mouth daily.    [provider]  omeprazole (PRILOSEC) 20 MG capsule Take 2  capsules (40 mg total) by mouth daily as needed (k through heart burn). 01/27/22   Yates Decamp, MD  REXULTI 1 MG TABS tablet Take 1 mg by mouth at bedtime. 11/09/21   [provider]  rosuvastatin (CRESTOR) 10 MG tablet Take 10 mg by mouth at bedtime.  06/25/18   [provider]  sertraline (ZOLOFT) 50 MG tablet Take 100 mg by mouth daily as needed. 06/28/21   [provider]  sitaGLIPtin-metformin (JANUMET) 50-1000 MG tablet Take 1 tablet by mouth 2 (two) times daily with a meal.     [provider]  solifenacin (VESICARE)  5 MG tablet Take 5 mg by mouth daily.    [provider]  spironolactone (ALDACTONE) 25 MG tablet Take 1 tablet by mouth once daily 01/25/22   Yates Decamp, MD  Vitamin D, Ergocalciferol, (DRISDOL) 1.25 MG (50000 UNIT) CAPS capsule Take by mouth. 12/28/21   [provider]  buPROPion (WELLBUTRIN XL) 150 MG 24 hr tablet Take 150 mg by mouth daily.    09/09/11  [provider]      Allergies    Ace inhibitors, Pollen extract, and Sulfamethoxazole-trimethoprim    Review of Systems   Review of Systems  Constitutional:  Positive for chills, fatigue and fever.  HENT:  Positive for sore throat. Negative for trouble swallowing.   Respiratory:  Positive for cough. Negative for chest tightness and shortness of breath.   Cardiovascular:  Negative for chest pain.  Gastrointestinal:  Positive for nausea and vomiting. Negative for abdominal pain and diarrhea.  Musculoskeletal:  Positive for myalgias.  Skin:  Negative for color change.  Neurological:  Positive for headaches. Negative for dizziness, syncope and weakness.    Physical Exam Updated Vital Signs BP 121/88 (BP Location: Right Arm)   Pulse 79   Temp 98.7 F (37.1 C) (Oral)   Resp 17   Ht 5\' 6"  (1.676 m)   Wt 77.1 kg   SpO2 97%   BMI 27.44 kg/m  Physical Exam Vitals and nursing note reviewed.  Constitutional:      General: She is not in acute distress.    Appearance: Normal appearance. She is ill-appearing. She is not diaphoretic.  HENT:     Nose: No congestion or rhinorrhea.     Mouth/Throat:     Lips: Pink.     Mouth: Mucous membranes are moist.     Pharynx: Oropharynx is clear. Uvula midline. Posterior oropharyngeal erythema present. No oropharyngeal exudate.     Tonsils: No tonsillar exudate.     Comments: Lips mildly dry, mouth is moist Cardiovascular:     Rate and Rhythm: Normal rate and regular rhythm.     Pulses: Normal pulses.     Heart sounds: Normal heart sounds.  Pulmonary:     Effort:  Pulmonary effort is normal. No accessory muscle usage or respiratory distress.     Breath sounds: Normal breath sounds and air entry.  Abdominal:     General: Abdomen is flat. Bowel sounds are normal.     Palpations: Abdomen is soft.     Tenderness: There is no abdominal tenderness.  Musculoskeletal:     Cervical back: Neck supple.  Lymphadenopathy:     Cervical: No cervical adenopathy.  Skin:    General: Skin is warm and dry.     Capillary Refill: Capillary refill takes less than 2 seconds.  Neurological:     Mental Status: She is alert. Mental status is at baseline.  Psychiatric:  Mood and Affect: Mood normal.        Behavior: Behavior normal.     ED Results / Procedures / Treatments   Labs (all labs ordered are listed, but only abnormal results are displayed) Labs Reviewed  RESP PANEL BY RT-PCR (FLU A&B, COVID) ARPGX2 - Abnormal; Notable for the following components:      Result Value   SARS Coronavirus 2 by RT PCR POSITIVE (*)    All other components within normal limits    EKG None  Radiology No results found.  Procedures Procedures    Medications Ordered in ED Medications - No data to display  ED Course/ Medical Decision Making/ A&P                           Medical Decision Making  This patient presents to the ED with chief complaint(s) of flu-like symptoms x1 week with pertinent past medical history of asthma, HTN, DM .The complaint involves an extensive differential diagnosis and also carries with it a high risk of complications and morbidity.    The differential diagnosis includes COVID, influenza, gastroenteritis, rhinovirus, parainfluenza   The initial plan is to obtain flu and COVID swab  Additional history obtained: Records reviewed  telephone encounter from 11/6, meds, cardiology progress notes  Initial Assessment:   Upon assessment, patient is lying in bed comfortably and does not appear to be in acute distress.  She is ill-appearing  but not toxic.  Heart rate and rhythm normal, lung sounds clear bilaterally.  No cough appreciated on exam.  Lips are minimally dry, mouth is moist, pharynx erythematous with mildly swollen tonsils, airway is patent, no obstruction.  Abdomen soft and nontender.  She does report still feeling mildly nauseated.  Independent ECG/labs interpretation:  The following labs were independently interpreted:  Patient positive for COVID, negative for influenza. ECG demonstrates sinus rhythm without ischemia or ectopy   Treatment and Reassessment: Plan to treat patient's nausea with ODT Zofran.  Patient was reported to have a syncopal episode in the waiting room, patient denies injury or pain.  Will obtain orthostatic vital signs and plan to treat with fluids if positive.  Orthostatics positive, BP lying 115/83 to standing at 3 minutes 99/77.  HR 80 --> 90.  I suspect syncopal episode was orthostatic in nature.  NS fluid bolus given with improvement in BP.  Patient states she is feeling much better.  She tolerated drinking liquids.   Other treatment options considered:   Paxlovid was considered, however, based on length of time of patient's symptoms and her stating she is improving, I do not feel that she is a candidate.  Disposition:   Patient improved following treatment, was walking around without lightheadedness or dizziness, experienced no further syncopal episodes, and states she feels much better.  I believe she is appropriate for discharge home with medication to help with nausea/vomiting.  Discussed with patient supportive care measures for home and importance of hydration.  Patient agrees with plan of discharge.  Recommended follow-up with PCP if her symptoms do not begin to improve.  Return precautions given.            Final Clinical Impression(s) / ED Diagnoses Final diagnoses:  None    Rx / DC Orders ED Discharge Orders     None         Lenard Simmer, [place] 05/11/22 [id number]     Maia Plan, MD 05/17/22 [phone]

## 2022-05-11 NOTE — ED Notes (Signed)
AVS with prescriptions provided to and discussed with patient. Pt verbalizes understanding of discharge instructions and denies any questions or concerns at this time. Pt ambulated out of department independently with steady gait. ? ?

## 2022-05-11 NOTE — Discharge Instructions (Addendum)
You were seen in the ER for flu-like symptoms.  You tested positive for COVID-19.  You also had orthostatic hypotension which likely caused your syncopal episode.  It is important for you to maintain hydration at home by drinking water, Gatorade, Pedialyte or other electrolyte drinks.  I am sending you home with a prescription of Zofran to help with nausea and vomiting.  I recommend using Tylenol for fever and body aches.  Follow-up with your primary care provider if your symptoms do not begin to improve.  Return to the ER if you develop new or worsening symptoms or experience further episodes of fainting.

## 2022-05-12 ENCOUNTER — Other Ambulatory Visit: Payer: Medicaid Other

## 2022-06-01 ENCOUNTER — Ambulatory Visit
Admission: EM | Admit: 2022-06-01 | Discharge: 2022-06-01 | Discharge status: Absent without leave | Payer: Medicaid Other

## 2022-06-09 ENCOUNTER — Ambulatory Visit: Payer: Medicaid Other

## 2022-06-09 DIAGNOSIS — I5032 Chronic diastolic (congestive) heart failure: Secondary | ICD-10-CM

## 2022-06-09 DIAGNOSIS — I428 Other cardiomyopathies: Secondary | ICD-10-CM

## 2022-06-16 ENCOUNTER — Ambulatory Visit: Payer: Medicaid Other | Admitting: Cardiology

## 2022-06-16 ENCOUNTER — Encounter: Payer: Self-pay | Admitting: Cardiology

## 2022-06-16 ENCOUNTER — Ambulatory Visit: Payer: Medicaid Other

## 2022-06-16 VITALS — BP 114/76 | HR 72 | Resp 14 | Ht 66.0 in | Wt 177.4 lb

## 2022-06-16 DIAGNOSIS — E782 Mixed hyperlipidemia: Secondary | ICD-10-CM

## 2022-06-16 DIAGNOSIS — I5032 Chronic diastolic (congestive) heart failure: Secondary | ICD-10-CM

## 2022-06-16 DIAGNOSIS — E1122 Type 2 diabetes mellitus with diabetic chronic kidney disease: Secondary | ICD-10-CM

## 2022-06-16 DIAGNOSIS — I10 Essential (primary) hypertension: Secondary | ICD-10-CM

## 2022-06-16 NOTE — Progress Notes (Signed)
Primary Physician/Referring:  Berkley Harvey, NP  Patient ID: Jean Melton, female    DOB: August 05, 1960, 61 y.o.   MRN: [id number]  Chief Complaint  Patient presents with   Cardiomyopathy   Follow-up    1 year   HPI:    Jean Melton  is a 61 y.o.  with type II diabetes with stage IIIa chronic kidney disease, mixed hyperlipiedmia, and hypertension. In 2019 after a syncopal episode she was diagnosed with nonischemic cardiomyopathy and chronic systolic and diastolic heart failure.  With medical therapy on 03/12/2020,  EF of has normalized.  Patient presents here for annual visit, she is presently asymptomatic.  She did catch COVID last month and she is recuperating from this.  Feels slightly fatigued.  Past Medical History:  Diagnosis Date   Asthma    B12 deficiency    Bipolar 2 disorder ([place])    Depression    Diabetes ([place])    GERD (gastroesophageal reflux disease)    Heart failure (HCC)    Hypercholesterolemia    Hyperosmolarity due to secondary diabetes ([place]) 09/11/2011   Hypertension    Mixed incontinence    Non-ischemic cardiomyopathy (HCC)    Osteoarthritis    knees   Past Surgical History:  Procedure Laterality Date   ABDOMINAL HYSTERECTOMY     ABDOMINAL SURGERY     CATARACT EXTRACTION     CESAREAN SECTION     CHOLECYSTECTOMY     COLONOSCOPY  02/02/2012   Procedure: COLONOSCOPY;  Surgeon: Winfield Cunas., MD;  Location: [facility];  Service: Endoscopy;  Laterality: N/A;   EYE SURGERY     SKIN BIOPSY     STOMACH SURGERY     Family History  Problem Relation Age of Onset   Diabetes Father    Hypertension Father    Heart failure Father    Breast cancer Neg Hx     Social History   Tobacco Use   Smoking status: Never   Smokeless tobacco: Never  Substance Use Topics   Alcohol use: Yes    Comment: ocass   Marital Status: Single  ROS  Review of Systems  Cardiovascular:  Negative for chest pain, dyspnea on exertion and leg swelling.   Objective   Blood pressure 114/76, pulse 72, resp. rate 14, height _0  (1.676 m), weight 177 lb 6.4 oz (80.5 kg), SpO2 97 %.     06/16/2022    9:38 AM 05/11/2022    9:15 AM 05/11/2022    9:00 AM  Vitals with BMI  Height _1     Weight 177 lbs 6 oz    BMI 83.33    Systolic 832 919 166  Diastolic 76 70 78  Pulse 72 82 82     Physical Exam Vitals reviewed.  Constitutional:      Comments: Overweight female in no acute distress  Neck:     Vascular: No JVD.  Cardiovascular:     Rate and Rhythm: Normal rate and regular rhythm.     Pulses: Intact distal pulses.     Heart sounds: Normal heart sounds, S1 normal and S2 normal. No murmur heard.    No gallop.  Pulmonary:     Effort: Pulmonary effort is normal. No respiratory distress.     Breath sounds: Normal breath sounds. No wheezing, rhonchi or rales.  Abdominal:     General: Bowel sounds are normal.     Palpations: Abdomen is soft.  Musculoskeletal:     Right  lower leg: No edema.     Left lower leg: No edema.    Laboratory examination:   External labs:   Labs 03/28/2022:  BUN 19, creatinine 1.16, EGFR 54 mL, potassium 4.1.  Vitamin D 40.  A1c 7.1%.  Hb 11.5/HCT 36.5, platelets 332.  Normal indicis.  Total cholesterol 179, triglycerides 193, HDL 42, LDL 97.  Labs 07/20/2021:  BUN 19, creatinine 1.25, EGFR 49 mL, vitamin D 15.  A1c 7.1%.  Total cholesterol 201, triglycerides 191, HDL 55, LDL 108.   Allergies   Allergies  Allergen Reactions   Ace Inhibitors     cough   Pollen Extract Other (See Comments)   Sulfamethoxazole-Trimethoprim Other (See Comments)    Hospital thinks that this is what caused the the GI bleed Hospital thinks that this is what caused the the GI bleed     Final Medications at End of Visit    Current Outpatient Medications:    albuterol (VENTOLIN HFA) 108 (90 Base) MCG/ACT inhaler, Inhale 2 puffs into the lungs every 4 (four) hours as needed for wheezing. , Disp: , Rfl:    aspirin 81 MG  tablet, Take 81 mg by mouth daily., Disp: , Rfl:    cetirizine (ZYRTEC) 10 MG tablet, Take 10 mg by mouth daily as needed for allergies. , Disp: , Rfl:    cycloSPORINE (RESTASIS) 0.05 % ophthalmic emulsion, Place 1 drop into both eyes 2 (two) times daily., Disp: , Rfl:    dapagliflozin propanediol (FARXIGA) 10 MG TABS tablet, Take 10 mg by mouth daily., Disp: , Rfl:    ezetimibe (ZETIA) 10 MG tablet, Take 1 tablet (10 mg total) by mouth daily., Disp: 90 tablet, Rfl: 0   famotidine (PEPCID) 20 MG tablet, Take 1 tablet (20 mg total) by mouth 2 (two) times daily., Disp: 180 tablet, Rfl: 0   Ferrous Sulfate (IRON) 325 (65 Fe) MG TABS, Take 325 mg by mouth daily., Disp: , Rfl:    fluticasone (FLONASE) 50 MCG/ACT nasal spray, Place 1 spray into the nose daily as needed for allergies. , Disp: , Rfl:    Fluticasone-Salmeterol (ADVAIR) 250-50 MCG/DOSE AEPB, Inhale 1 puff into the lungs 2 (two) times daily., Disp: , Rfl:    glucose blood test strip, USE TO CHECK BLOOD SUGAR TWO TIMES DAILY, Disp: , Rfl:    Lancets (ACCU-CHEK MULTICLIX) lancets, 1 each by Misc.(Non-Drug; Combo Route) route daily., Disp: , Rfl:    LORazepam (ATIVAN) 0.5 MG tablet, Take 1 tablet by mouth daily., Disp: , Rfl:    losartan-hydrochlorothiazide (HYZAAR) 100-25 MG tablet, Take 1 tablet by mouth once daily, Disp: 90 tablet, Rfl: 0   methocarbamol (ROBAXIN) 500 MG tablet, Take 500 mg by mouth every 8 (eight) hours as needed., Disp: , Rfl:    Multiple Vitamin (MULTIVITAMIN) capsule, Take 1 capsule by mouth daily., Disp: , Rfl:    omeprazole (PRILOSEC) 20 MG capsule, Take 2 capsules (40 mg total) by mouth daily as needed (k through heart burn)., Disp: 60 capsule, Rfl: 1   sitaGLIPtin-metformin (JANUMET) 50-1000 MG tablet, Take 1 tablet by mouth 2 (two) times daily with a meal. , Disp: , Rfl:    solifenacin (VESICARE) 5 MG tablet, Take 5 mg by mouth daily., Disp: , Rfl:    spironolactone (ALDACTONE) 25 MG tablet, Take 1 tablet by mouth  once daily, Disp: 90 tablet, Rfl: 0   Vitamin D, Ergocalciferol, (DRISDOL) 1.25 MG (50000 UNIT) CAPS capsule, Take by mouth., Disp: , Rfl:  rosuvastatin (CRESTOR) 20 MG tablet, Take 1 tablet (20 mg total) by mouth daily., Disp: , Rfl:    Radiology:   No results found.  Cardiac Studies:    Holter Monitor 24 hours 11/09/2017: No symptoms reported. Predominant rhythm is sinus tachycardia. Patient had 18 hours of sinus tachycardia, maximum heart rate 152 bpm. Average heart rate 106 bpm. Occasional PVCs.  Echocardiogram 40/04/2724 Normal LV systolic function with visual EF 50-55%. Left ventricle cavity  is normal in size. Moderate left ventricular hypertrophy. Normal global wall motion. Doppler evidence of grade II diastolic dysfunction,  indeterminate LAP.  Left atrial cavity is mildly dilated.  Moderate mitral valve leaflet thickening. Normal mitral valve leaflet mobility. No evidence of mitral stenosis. Mild (Grade I) mitral regurgitation.  Mild tricuspid regurgitation.  Mild pulmonic regurgitation.  Insignificant pericardial effusion.  Compared to prior study 11/15/2018: LVEF is improved from 40-45% to 50-55%, Grade 1 to Grade 2 DD, no significant change in valvular heart disease.   PCV MYOCARDIAL PERFUSION WITH LEXISCAN 01/10/2022  Narrative Lexiscan/modified [place] nuclear stress test 01/10/2022: 1 Day Rest/Stress protocol. Exercise time 4 minutes, achieved 2.27 METS, 68% of age-predicted maximum heart rate. Stress EKG is non-diagnostic target heart rate not achieved/pharmacologic stress. Normal myocardial perfusion without evidence of reversible ischemia or prior infarct. Left ventricular size normal, calculated LVEF 57%, wall thickness preserved, no regional wall motion abnormalities. Prior study 10/06/2017 exercise time 6 minutes, achieved 7.05 METS, 99% of age-predicted maximum heart rate, normal myocardial perfusion, calculated LVEF 50%. Low risk study.  PCV ECHOCARDIOGRAM  COMPLETE 06/09/2022  Narrative Echocardiogram 06/09/2022: Left ventricle cavity is normal in size and wall thickness. Normal global wall motion. Normal LV systolic function with EF 56%. Doppler evidence of grade I (impaired) diastolic dysfunction, normal LAP. Mild (Grade I) mitral regurgitation. Mild tricuspid regurgitation. Mild pulmonic regurgitation. No evidence of pulmonary hypertension. No significant change compared to previous study in 2021.     EKG   EKG 06/16/2022: Normal sinus rhythm with rate of 75 bpm, normal axis, no evidence of ischemia, normal EKG.  Compared to 12/03/2021, no change.  Assessment     ICD-10-CM   1. Primary hypertension  I10     2. Chronic diastolic heart failure (HCC)  I50.32 EKG 12-Lead    3. Mixed hyperlipidemia  E78.2 rosuvastatin (CRESTOR) 20 MG tablet    4. Type 2 diabetes mellitus with stage 3a chronic kidney disease, without long-term current use of insulin (HCC)  E11.22    N18.31        Medications Discontinued During This Encounter  Medication Reason   sertraline (ZOLOFT) 50 MG tablet    REXULTI 1 MG TABS tablet    atenolol (TENORMIN) 50 MG tablet    ondansetron (ZOFRAN-ODT) 4 MG disintegrating tablet    rosuvastatin (CRESTOR) 10 MG tablet Reorder     No orders of the defined types were placed in this encounter.   Recommendations:   CASSANDRIA DREW is a 61 y.o. with type II diabetes with stage IIIa chronic kidney disease, mixed hyperlipiedmia, and hypertension. In 2019 after a syncopal episode she was diagnosed with nonischemic cardiomyopathy and chronic systolic and diastolic heart failure.  With medical therapy on 03/12/2020,  EF of has normalized.  1. Primary hypertension Blood pressure is under excellent control, no changes in the medications need to be done.  She is on appropriate medical therapy.  2. Chronic diastolic heart failure ([place]) She has not had any further episodes of diastolic heart failure, reviewed the  echocardiogram, EF has now normalized.  She has had a low risk stress test just 6 months ago in June 2023.  3. Mixed hyperlipidemia LDL is improved with addition of Zetia however still not at goal, goal LDL <70.  Will increase rosuvastatin from 10 mg to 20 mg.  She will get the prescription from her PCP, Eldridge Abrahams, NP.  4. Type 2 diabetes mellitus with stage 3a chronic kidney disease, without long-term current use of insulin ([place]) I have discussed with her regarding stage IIIa chronic kidney disease with related complications that can arise from uncontrolled diabetes.  Elevated triglycerides are also related to uncontrolled diabetes.  I have discussed with her regarding high-fat diet and which foods to avoid, she appears to be very motivated.  Otherwise she is on appropriate medical therapy from cardiac standpoint, I will see her back on a as needed basis.    Adrian Prows, PA-C 06/16/2022, 10:41 AM Office: [phone]

## 2022-06-29 ENCOUNTER — Ambulatory Visit (INDEPENDENT_AMBULATORY_CARE_PROVIDER_SITE_OTHER): Payer: Medicaid Other

## 2022-06-29 ENCOUNTER — Ambulatory Visit
Admission: EM | Admit: 2022-06-29 | Discharge: 2022-06-29 | Disposition: A | Discharge status: Home / Self Care | Payer: Medicaid Other | Attending: Physician Assistant | Admitting: Physician Assistant

## 2022-06-29 DIAGNOSIS — M25561 Pain in right knee: Secondary | ICD-10-CM | POA: Diagnosis not present

## 2022-06-29 DIAGNOSIS — W19XXXA Unspecified fall, initial encounter: Secondary | ICD-10-CM | POA: Diagnosis not present

## 2022-06-29 DIAGNOSIS — M25562 Pain in left knee: Secondary | ICD-10-CM | POA: Diagnosis not present

## 2022-06-29 DIAGNOSIS — M25551 Pain in right hip: Secondary | ICD-10-CM

## 2022-06-29 NOTE — ED Triage Notes (Signed)
Pt c/o falling in ED last month 11/8. States they did not do xray on patient. Here for bilat knee, right hip, head xray stating these parts of body are hurting. Denies pain in triage.

## 2022-06-29 NOTE — ED Provider Notes (Signed)
[facility]    CSN: [id number] Arrival date & time: 06/29/22  0835      History   Chief Complaint Chief Complaint  Patient presents with   wants xrays    HPI Jean Melton is a 61 y.o. female.   Patient here today for evaluation of bilateral knee pain and right hip pain since falling in ED on 05/11/2022. She reports she has seen her PCP and they did order imaging however she does not drive and the locations she needs to travel to for imaging are too far for her. She reports pain is worse to right knee and she feels this is affecting her right hip. She does not report any numbness or tingling. She has had some headache as well. She notes that walking worsens joint pain. PMH significant for known arthritis.   The history is provided by the patient.    Past Medical History:  Diagnosis Date   Asthma    B12 deficiency    Bipolar 2 disorder (HCC)    Depression    Diabetes (HCC)    GERD (gastroesophageal reflux disease)    Heart failure (HCC)    Hypercholesterolemia    Hyperosmolarity due to secondary diabetes (HCC) 09/11/2011   Hypertension    Mixed incontinence    Non-ischemic cardiomyopathy (HCC)    Osteoarthritis    knees    Patient Active Problem List   Diagnosis Date Noted   Tibial plateau fracture, right 06/17/2020   Contusion of left knee 05/27/2020   Non-ischemic cardiomyopathy (HCC) 10/06/2017   Bipolar 2 disorder (HCC) 07/18/2017   Arthritis 07/18/2017   Iliotibial band syndrome of right side 05/05/2017   Patellofemoral pain syndrome of both knees 10/28/2016   Hematochezia    Esophageal reflux    Hypercholesterolemia    Controlled diabetes mellitus type II without complication (HCC)    Chronic pain of both ankles 07/29/2015   Mixed incontinence 10/16/2014   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   Lichen planus 06/24/2014   Rash 03/21/2014   Left elbow pain 10/08/2013   Hyponatremia 09/09/2011   Hyperkalemia 09/09/2011   HTN (hypertension)  09/09/2011   Dehydration 09/09/2011   GERD (gastroesophageal reflux disease) 09/09/2011   Asthma 09/09/2011    Past Surgical History:  Procedure Laterality Date   ABDOMINAL HYSTERECTOMY     ABDOMINAL SURGERY     CATARACT EXTRACTION     CESAREAN SECTION     CHOLECYSTECTOMY     COLONOSCOPY  02/02/2012   Procedure: COLONOSCOPY;  Surgeon: Vertell Novak., MD;  Location: [facility];  Service: Endoscopy;  Laterality: N/A;   EYE SURGERY     SKIN BIOPSY     STOMACH SURGERY      OB History     Gravida  4   Para  4   Term  4   Preterm      AB      Living  5      SAB      IAB      Ectopic      Multiple  1   Live Births  5            Home Medications    Prior to Admission medications   Medication Sig Start Date End Date Taking? Authorizing Provider  albuterol (VENTOLIN HFA) 108 (90 Base) MCG/ACT inhaler Inhale 2 puffs into the lungs every 4 (four) hours as needed for wheezing.  05/11/15   [provider]  aspirin  81 MG tablet Take 81 mg by mouth daily.    [provider]  cetirizine (ZYRTEC) 10 MG tablet Take 10 mg by mouth daily as needed for allergies.  05/11/15   [provider]  cycloSPORINE (RESTASIS) 0.05 % ophthalmic emulsion Place 1 drop into both eyes 2 (two) times daily. 05/12/15   [provider]  dapagliflozin propanediol (FARXIGA) 10 MG TABS tablet Take 10 mg by mouth daily. 06/05/21   [provider]  ezetimibe (ZETIA) 10 MG tablet Take 1 tablet (10 mg total) by mouth daily. 01/27/22 06/16/22  Yates Decamp, MD  famotidine (PEPCID) 20 MG tablet Take 1 tablet (20 mg total) by mouth 2 (two) times daily. 01/27/22   Yates Decamp, MD  Ferrous Sulfate (IRON) 325 (65 Fe) MG TABS Take 325 mg by mouth daily.    [provider]  fluticasone (FLONASE) 50 MCG/ACT nasal spray Place 1 spray into the nose daily as needed for allergies.  05/18/18   [provider]  Fluticasone-Salmeterol (ADVAIR) 250-50  MCG/DOSE AEPB Inhale 1 puff into the lungs 2 (two) times daily. 05/11/15   [provider]  glucose blood test strip USE TO CHECK BLOOD SUGAR TWO TIMES DAILY 02/22/17   [provider]  Lancets (ACCU-CHEK MULTICLIX) lancets 1 each by Misc.(Non-Drug; Combo Route) route daily. 08/03/17   [provider]  LORazepam (ATIVAN) 0.5 MG tablet Take 1 tablet by mouth daily. 07/01/20   [provider]  losartan-hydrochlorothiazide Mauri Reading) 100-25 MG tablet Take 1 tablet by mouth once daily 11/22/21   Yates Decamp, MD  methocarbamol (ROBAXIN) 500 MG tablet Take 500 mg by mouth every 8 (eight) hours as needed. 11/15/19   [provider]  Multiple Vitamin (MULTIVITAMIN) capsule Take 1 capsule by mouth daily.    [provider]  omeprazole (PRILOSEC) 20 MG capsule Take 2 capsules (40 mg total) by mouth daily as needed (k through heart burn). 01/27/22   Yates Decamp, MD  rosuvastatin (CRESTOR) 20 MG tablet Take 1 tablet (20 mg total) by mouth daily. 06/16/22   Yates Decamp, MD  sitaGLIPtin-metformin (JANUMET) 50-1000 MG tablet Take 1 tablet by mouth 2 (two) times daily with a meal.     [provider]  solifenacin (VESICARE) 5 MG tablet Take 5 mg by mouth daily.    [provider]  spironolactone (ALDACTONE) 25 MG tablet Take 1 tablet by mouth once daily 01/25/22   Yates Decamp, MD  Vitamin D, Ergocalciferol, (DRISDOL) 1.25 MG (50000 UNIT) CAPS capsule Take by mouth. 12/28/21   [provider]  buPROPion (WELLBUTRIN XL) 150 MG 24 hr tablet Take 150 mg by mouth daily.    09/09/11  [provider]    Family History Family History  Problem Relation Age of Onset   Diabetes Father    Hypertension Father    Heart failure Father    Breast cancer Neg Hx     Social History Social History   Tobacco Use   Smoking status: Never   Smokeless tobacco: Never  Vaping Use   Vaping Use: Never used  Substance Use Topics   Alcohol use: Yes     Comment: ocass   Drug use: No     Allergies   Ace inhibitors, Pollen extract, and Sulfamethoxazole-trimethoprim   Review of Systems Review of Systems  Constitutional:  Negative for chills and fever.  Eyes:  Negative for discharge and redness.  Respiratory:  Negative for shortness of breath.   Gastrointestinal:  Negative for  abdominal pain, nausea and vomiting.  Musculoskeletal:  Positive for arthralgias. Negative for joint swelling.  Skin:  Negative for color change and wound.  Neurological:  Negative for numbness.     Physical Exam Triage Vital Signs ED Triage Vitals [06/29/22 0904]  Enc Vitals Group     BP 115/87     Pulse Rate 92     Resp 16     Temp 99 F (37.2 C)     Temp Source Oral     SpO2 95 %     Weight      Height      Head Circumference      Peak Flow      Pain Score 0     Pain Loc      Pain Edu?      Excl. in GC?    No data found.  Updated Vital Signs BP 115/87 (BP Location: Left Arm)   Pulse 92   Temp 99 F (37.2 C) (Oral)   Resp 16   SpO2 95%     Physical Exam Vitals and nursing note reviewed.  Constitutional:      General: She is not in acute distress.    Appearance: Normal appearance. She is not ill-appearing.  HENT:     Head: Normocephalic and atraumatic.  Eyes:     Conjunctiva/sclera: Conjunctivae normal.  Cardiovascular:     Rate and Rhythm: Normal rate.  Pulmonary:     Effort: Pulmonary effort is normal. No respiratory distress.  Musculoskeletal:     Comments: Mildly decreased extension of right knee, Full ROM of right and left knee otherwise. No swelling or erythema noted to bilateral knees.   Neurological:     Mental Status: She is alert.  Psychiatric:        Mood and Affect: Mood normal.        Behavior: Behavior normal.        Thought Content: Thought content normal.      UC Treatments / Results  Labs (all labs ordered are listed, but only abnormal results are displayed) Labs Reviewed - No data to  display  EKG   Radiology DG Knee 2 Views Left  Result Date: 06/29/2022 CLINICAL DATA:  Fall EXAM: LEFT KNEE - 1-2 VIEW COMPARISON:  Left knee 05/27/2020 FINDINGS: Normal alignment no fracture.  No joint effusion. Mild spurring patella. Soft tissue calcification medial to the medial femoral condyle compatible with chronic medial collateral ligament injury. IMPRESSION: 1. No acute abnormality. 2. Chronic medial collateral ligament injury. Electronically Signed   By: Marlan Palau M.D.   On: 06/29/2022 09:44   DG Knee 2 Views Right  Result Date: 06/29/2022 CLINICAL DATA:  Fall EXAM: RIGHT KNEE - 1-2 VIEW COMPARISON:  Right knee 07/28/2020 FINDINGS: Normal alignment.  Negative for acute fracture or effusion Soft tissue calcification medial to the medial femoral condyle compatible with chronic medial collateral ligament injury. IMPRESSION: No acute abnormality. Electronically Signed   By: Marlan Palau M.D.   On: 06/29/2022 09:43   DG Hip Unilat With Pelvis 2-3 Views Right  Result Date: 06/29/2022 CLINICAL DATA:  Fall.  Hip pain EXAM: DG HIP (WITH OR WITHOUT PELVIS) 2-3V RIGHT COMPARISON:  None Available. FINDINGS: There is no evidence of hip fracture or dislocation. There is no evidence of arthropathy or other focal bone abnormality. IMPRESSION: Negative. Electronically Signed   By: Marlan Palau M.D.   On: 06/29/2022 09:42    Procedures Procedures (including critical care time)  Medications  Ordered in UC Medications - No data to display  Initial Impression / Assessment and Plan / UC Course  I have reviewed the triage vital signs and the nursing notes.  Pertinent labs & imaging results that were available during my care of the patient were reviewed by me and considered in my medical decision making (see chart for details).    Xrays ordered as requested- no fractures noted, no acute findings. Recommended follow up with ortho and PCP for further evaluation and management as she may need  additional imaging, treatment for acute on chronic pain.    Final Clinical Impressions(s) / UC Diagnoses   Final diagnoses:  Acute pain of both knees  Right hip pain     Discharge Instructions       Great news!! No fracture on Xray. I recommend follow up with ortho as discussed briefly. Continue follow up with your primary care provider.     ED Prescriptions   None    PDMP not reviewed this encounter.   Tomi Bamberger, PA-C 06/29/22 1019

## 2022-06-29 NOTE — Discharge Instructions (Signed)
  Great news!! No fracture on Xray. I recommend follow up with ortho as discussed briefly. Continue follow up with your primary care provider.

## 2022-07-05 ENCOUNTER — Other Ambulatory Visit: Payer: Self-pay | Admitting: Cardiology

## 2022-07-06 ENCOUNTER — Telehealth: Payer: Self-pay

## 2022-07-06 NOTE — Telephone Encounter (Signed)
This was discontinued in 2020.  However if she has been taking the medication, please ask her to make sure that she has been on it and if she is on it I do not mind prescribing that medication again.  Do a 30-day supply and set her up to see me in the office in the next 3 to 4 weeks.  Follow-up of cardiomyopathy and hypertension.

## 2022-07-06 NOTE — Telephone Encounter (Signed)
Patient wants a refill on Atenolol. In the medication list it has been discontinued. I do not see it was discontinued in your office note. She has been taking 50mg  bid. The discontinued Rx says 100mg  bid. Does she need to be taking it? If so what dose do you want her on?

## 2022-07-07 ENCOUNTER — Ambulatory Visit: Payer: Medicaid Other | Admitting: Student

## 2022-07-07 MED ORDER — ATENOLOL 50 MG PO TABS: 50.0000 mg | ORAL_TABLET | Freq: Two times a day (BID) | ORAL | 0 refills | Status: DC

## 2022-07-25 ENCOUNTER — Other Ambulatory Visit: Payer: Self-pay

## 2022-07-25 MED ORDER — ATENOLOL 50 MG PO TABS: 50.0000 mg | ORAL_TABLET | Freq: Two times a day (BID) | ORAL | 6 refills | Status: DC

## 2022-08-03 NOTE — Progress Notes (Deleted)
Primary Physician/Referring:  Berkley Harvey, NP  Patient ID: Jean Melton, female    DOB: 1960-12-04, 62 y.o.   MRN: [id number]  No chief complaint on file.  HPI:    Jean Melton  is a 62 y.o.  with type II diabetes with stage IIIa chronic kidney disease, mixed hyperlipiedmia, and hypertension. In 2019 after a syncopal episode she was diagnosed with nonischemic cardiomyopathy and chronic systolic and diastolic heart failure.  With medical therapy on 03/12/2020,  EF of has normalized.  Patient presents here for annual visit, she is presently asymptomatic.  She did catch COVID last month and she is recuperating from this.  Feels slightly fatigued.  Past Medical History:  Diagnosis Date   Asthma    B12 deficiency    Bipolar 2 disorder ([place])    Depression    Diabetes ([place])    GERD (gastroesophageal reflux disease)    Heart failure (HCC)    Hypercholesterolemia    Hyperosmolarity due to secondary diabetes ([place]) 09/11/2011   Hypertension    Mixed incontinence    Non-ischemic cardiomyopathy (HCC)    Osteoarthritis    knees   Past Surgical History:  Procedure Laterality Date   ABDOMINAL HYSTERECTOMY     ABDOMINAL SURGERY     CATARACT EXTRACTION     CESAREAN SECTION     CHOLECYSTECTOMY     COLONOSCOPY  02/02/2012   Procedure: COLONOSCOPY;  Surgeon: Winfield Cunas., MD;  Location: [facility];  Service: Endoscopy;  Laterality: N/A;   EYE SURGERY     SKIN BIOPSY     STOMACH SURGERY     Family History  Problem Relation Age of Onset   Diabetes Father    Hypertension Father    Heart failure Father    Breast cancer Neg Hx     Social History   Tobacco Use   Smoking status: Never   Smokeless tobacco: Never  Substance Use Topics   Alcohol use: Yes    Comment: ocass   Marital Status: Single  ROS  Review of Systems  Cardiovascular:  Negative for chest pain, dyspnea on exertion and leg swelling.   Objective  There were no vitals taken for this visit.     06/29/2022     9:04 AM 06/16/2022    9:38 AM 05/11/2022    9:15 AM  Vitals with BMI  Height  '5\' 6"'$    Weight  177 lbs 6 oz   BMI  0000000   Systolic [id number]  Diastolic 87 76 70  Pulse 92 72 82     Physical Exam Vitals reviewed.  Constitutional:      Comments: Overweight female in no acute distress  Neck:     Vascular: No JVD.  Cardiovascular:     Rate and Rhythm: Normal rate and regular rhythm.     Pulses: Intact distal pulses.     Heart sounds: Normal heart sounds, S1 normal and S2 normal. No murmur heard.    No gallop.  Pulmonary:     Effort: Pulmonary effort is normal. No respiratory distress.     Breath sounds: Normal breath sounds. No wheezing, rhonchi or rales.  Abdominal:     General: Bowel sounds are normal.     Palpations: Abdomen is soft.  Musculoskeletal:     Right lower leg: No edema.     Left lower leg: No edema.    Laboratory examination:   External labs:   Labs 03/28/2022:  BUN  19, creatinine 1.16, EGFR 54 mL, potassium 4.1.  Vitamin D 40.  A1c 7.1%.  Hb 11.5/HCT 36.5, platelets 332.  Normal indicis.  Total cholesterol 179, triglycerides 193, HDL 42, LDL 97.  Labs 07/20/2021:  BUN 19, creatinine 1.25, EGFR 49 mL, vitamin D 15.  A1c 7.1%.  Total cholesterol 201, triglycerides 191, HDL 55, LDL 108.   Allergies   Allergies  Allergen Reactions   Ace Inhibitors     cough   Pollen Extract Other (See Comments)   Sulfamethoxazole-Trimethoprim Other (See Comments)    Hospital thinks that this is what caused the the GI bleed Hospital thinks that this is what caused the the GI bleed     Final Medications at End of Visit    Current Outpatient Medications:    albuterol (VENTOLIN HFA) 108 (90 Base) MCG/ACT inhaler, Inhale 2 puffs into the lungs every 4 (four) hours as needed for wheezing. , Disp: , Rfl:    aspirin 81 MG tablet, Take 81 mg by mouth daily., Disp: , Rfl:    atenolol (TENORMIN) 50 MG tablet, Take 1 tablet (50 mg total) by mouth 2 (two) times  daily., Disp: 60 tablet, Rfl: 6   cetirizine (ZYRTEC) 10 MG tablet, Take 10 mg by mouth daily as needed for allergies. , Disp: , Rfl:    cycloSPORINE (RESTASIS) 0.05 % ophthalmic emulsion, Place 1 drop into both eyes 2 (two) times daily., Disp: , Rfl:    dapagliflozin propanediol (FARXIGA) 10 MG TABS tablet, Take 10 mg by mouth daily., Disp: , Rfl:    ezetimibe (ZETIA) 10 MG tablet, Take 1 tablet (10 mg total) by mouth daily., Disp: 90 tablet, Rfl: 0   famotidine (PEPCID) 20 MG tablet, Take 1 tablet (20 mg total) by mouth 2 (two) times daily., Disp: 180 tablet, Rfl: 0   Ferrous Sulfate (IRON) 325 (65 Fe) MG TABS, Take 325 mg by mouth daily., Disp: , Rfl:    fluticasone (FLONASE) 50 MCG/ACT nasal spray, Place 1 spray into the nose daily as needed for allergies. , Disp: , Rfl:    Fluticasone-Salmeterol (ADVAIR) 250-50 MCG/DOSE AEPB, Inhale 1 puff into the lungs 2 (two) times daily., Disp: , Rfl:    glucose blood test strip, USE TO CHECK BLOOD SUGAR TWO TIMES DAILY, Disp: , Rfl:    Lancets (ACCU-CHEK MULTICLIX) lancets, 1 each by Misc.(Non-Drug; Combo Route) route daily., Disp: , Rfl:    LORazepam (ATIVAN) 0.5 MG tablet, Take 1 tablet by mouth daily., Disp: , Rfl:    losartan-hydrochlorothiazide (HYZAAR) 100-25 MG tablet, Take 1 tablet by mouth once daily, Disp: 90 tablet, Rfl: 0   methocarbamol (ROBAXIN) 500 MG tablet, Take 500 mg by mouth every 8 (eight) hours as needed., Disp: , Rfl:    Multiple Vitamin (MULTIVITAMIN) capsule, Take 1 capsule by mouth daily., Disp: , Rfl:    omeprazole (PRILOSEC) 20 MG capsule, Take 2 capsules (40 mg total) by mouth daily as needed (k through heart burn)., Disp: 60 capsule, Rfl: 1   rosuvastatin (CRESTOR) 20 MG tablet, Take 1 tablet (20 mg total) by mouth daily., Disp: , Rfl:    sitaGLIPtin-metformin (JANUMET) 50-1000 MG tablet, Take 1 tablet by mouth 2 (two) times daily with a meal. , Disp: , Rfl:    solifenacin (VESICARE) 5 MG tablet, Take 5 mg by mouth daily.,  Disp: , Rfl:    spironolactone (ALDACTONE) 25 MG tablet, Take 1 tablet by mouth once daily, Disp: 90 tablet, Rfl: 0   Vitamin D, Ergocalciferol, (  DRISDOL) 1.25 MG (50000 UNIT) CAPS capsule, Take by mouth., Disp: , Rfl:    Radiology:   No results found.  Cardiac Studies:   PCV MYOCARDIAL PERFUSION WITH LEXISCAN 01/10/2022  Narrative Lexiscan/modified [facility] nuclear stress test 01/10/2022: 1 Day Rest/Stress protocol. Exercise time 4 minutes, achieved 2.27 METS, 68% of age-predicted maximum heart rate. Stress EKG is non-diagnostic target heart rate not achieved/pharmacologic stress. Normal myocardial perfusion without evidence of reversible ischemia or prior infarct. Left ventricular size normal, calculated LVEF 57%, wall thickness preserved, no regional wall motion abnormalities. Prior study 10/06/2017 exercise time 6 minutes, achieved 7.05 METS, 99% of age-predicted maximum heart rate, normal myocardial perfusion, calculated LVEF 50%. Low risk study.  PCV ECHOCARDIOGRAM COMPLETE 06/09/2022  Narrative Echocardiogram 06/09/2022: Left ventricle cavity is normal in size and wall thickness. Normal global wall motion. Normal LV systolic function with EF 56%. Doppler evidence of grade I (impaired) diastolic dysfunction, normal LAP. Mild (Grade I) mitral regurgitation. Mild tricuspid regurgitation. Mild pulmonic regurgitation. No evidence of pulmonary hypertension. No significant change compared to previous study in 2021.     EKG   EKG 06/16/2022: Normal sinus rhythm with rate of 75 bpm, normal axis, no evidence of ischemia, normal EKG.  Compared to 12/03/2021, no change.  Assessment     ICD-10-CM   1. Primary hypertension  I10     2. Chronic diastolic heart failure (HCC)  I50.32        There are no discontinued medications.    No orders of the defined types were placed in this encounter.   Recommendations:   Jean Melton is a 62 y.o. with type II diabetes with stage IIIa  chronic kidney disease, mixed hyperlipiedmia, and hypertension. In 2019 after a syncopal episode she was diagnosed with nonischemic cardiomyopathy and chronic systolic and diastolic heart failure.  With medical therapy on 03/12/2020,  EF of has normalized.  1. Primary hypertension Blood pressure is under excellent control, no changes in the medications need to be done.  She is on appropriate medical therapy.  2. Chronic diastolic heart failure ([place]) She has not had any further episodes of diastolic heart failure, reviewed the echocardiogram, EF has now normalized.  She has had a low risk stress test just 6 months ago in June 2023.  3. Mixed hyperlipidemia LDL is improved with addition of Zetia however still not at goal, goal LDL <70.  Will increase rosuvastatin from 10 mg to 20 mg.  She will get the prescription from her PCP, Eldridge Abrahams, NP.  4. Type 2 diabetes mellitus with stage 3a chronic kidney disease, without long-term current use of insulin ([place]) I have discussed with her regarding stage IIIa chronic kidney disease with related complications that can arise from uncontrolled diabetes.  Elevated triglycerides are also related to uncontrolled diabetes.  I have discussed with her regarding high-fat diet and which foods to avoid, she appears to be very motivated.  Otherwise she is on appropriate medical therapy from cardiac standpoint, I will see her back on a as needed basis.    Adrian Prows, MD, [facility] 08/03/2022, 8:34 PM Office: [phone] Fax: [phone] Pager: [phone]

## 2022-08-04 ENCOUNTER — Ambulatory Visit: Payer: Medicaid Other | Admitting: Cardiology

## 2022-08-04 DIAGNOSIS — I5032 Chronic diastolic (congestive) heart failure: Secondary | ICD-10-CM

## 2022-08-04 DIAGNOSIS — I10 Essential (primary) hypertension: Secondary | ICD-10-CM

## 2022-08-25 ENCOUNTER — Ambulatory Visit: Payer: Medicaid Other | Admitting: Cardiology

## 2022-08-26 ENCOUNTER — Encounter: Payer: Self-pay | Admitting: Cardiology

## 2022-08-26 ENCOUNTER — Ambulatory Visit: Payer: Medicaid Other | Admitting: Cardiology

## 2022-08-26 VITALS — BP 126/86 | HR 77 | Ht 66.0 in | Wt 173.4 lb

## 2022-08-26 DIAGNOSIS — E782 Mixed hyperlipidemia: Secondary | ICD-10-CM

## 2022-08-26 DIAGNOSIS — I10 Essential (primary) hypertension: Secondary | ICD-10-CM

## 2022-08-26 DIAGNOSIS — I5032 Chronic diastolic (congestive) heart failure: Secondary | ICD-10-CM

## 2022-08-26 MED ORDER — ATENOLOL 50 MG PO TABS: 50.0000 mg | ORAL_TABLET | Freq: Two times a day (BID) | ORAL | 0 refills | Status: DC

## 2022-08-26 MED ORDER — EZETIMIBE 10 MG PO TABS: 10.0000 mg | ORAL_TABLET | Freq: Every day | ORAL | 0 refills | Status: DC

## 2022-08-26 MED ORDER — LOSARTAN POTASSIUM-HCTZ 100-25 MG PO TABS: 1.0000 | ORAL_TABLET | Freq: Every day | ORAL | 0 refills | Status: DC

## 2022-08-26 MED ORDER — SPIRONOLACTONE 25 MG PO TABS: 25.0000 mg | ORAL_TABLET | ORAL | 0 refills | Status: DC

## 2022-08-26 MED ORDER — ROSUVASTATIN CALCIUM 20 MG PO TABS: 20.0000 mg | ORAL_TABLET | Freq: Every day | ORAL | 0 refills | Status: DC

## 2022-08-26 MED ORDER — ATENOLOL 50 MG PO TABS: 50.0000 mg | ORAL_TABLET | Freq: Two times a day (BID) | ORAL | 6 refills | Status: DC

## 2022-08-26 NOTE — Progress Notes (Signed)
Primary Physician/Referring:  Berkley Harvey, Jean Melton  Patient ID: Jean Melton, female    DOB: 06-05-1961, 62 y.o.   MRN: [id number]  Chief Complaint  Patient presents with   Hypertension   Follow-up   HPI:    Jean Melton  is a 62 y.o.  with type II diabetes with stage IIIa chronic kidney disease, mixed hyperlipiedmia, and hypertension. In 2019 after a syncopal episode she was diagnosed with nonischemic cardiomyopathy and chronic systolic and diastolic heart failure.  With medical therapy on 03/12/2020,  EF of has normalized.  Patient presents here for annual visit, she is presently asymptomatic.  Marland Kitchen  Past Medical History:  Diagnosis Date   Asthma    B12 deficiency    Bipolar 2 disorder ([place])    Depression    Diabetes ([place])    GERD (gastroesophageal reflux disease)    Heart failure (HCC)    Hypercholesterolemia    Hyperosmolarity due to secondary diabetes ([place]) 09/11/2011   Hypertension    Mixed incontinence    Non-ischemic cardiomyopathy (HCC)    Osteoarthritis    knees   Past Surgical History:  Procedure Laterality Date   ABDOMINAL HYSTERECTOMY     ABDOMINAL SURGERY     CATARACT EXTRACTION     CESAREAN SECTION     CHOLECYSTECTOMY     COLONOSCOPY  02/02/2012   Procedure: COLONOSCOPY;  Surgeon: Winfield Cunas., MD;  Location: [facility];  Service: Endoscopy;  Laterality: N/A;   EYE SURGERY     SKIN BIOPSY     STOMACH SURGERY     Family History  Problem Relation Age of Onset   Diabetes Father    Hypertension Father    Heart failure Father    Breast cancer Neg Hx     Social History   Tobacco Use   Smoking status: Never   Smokeless tobacco: Never  Substance Use Topics   Alcohol use: Yes    Comment: ocass   Marital Status: Single  ROS  Review of Systems  Cardiovascular:  Negative for chest pain, dyspnea on exertion and leg swelling.   Objective  Blood pressure 126/86, pulse 77, height '5\' 6"'$  (1.676 m), weight 173 lb 6.4 oz (78.7 kg), SpO2 96 %.      08/26/2022    9:59 AM 06/29/2022    9:04 AM 06/16/2022    9:38 AM  Vitals with BMI  Height '5\' 6"'$   '5\' 6"'$   Weight 173 lbs 6 oz  177 lbs 6 oz  BMI 28  0000000  Systolic [id number]  Diastolic 86 87 76  Pulse 77 92 72     Physical Exam Vitals reviewed.  Constitutional:      Comments: Overweight female in no acute distress  Neck:     Vascular: No JVD.  Cardiovascular:     Rate and Rhythm: Normal rate and regular rhythm.     Pulses: Intact distal pulses.     Heart sounds: Normal heart sounds, S1 normal and S2 normal. No murmur heard.    No gallop.  Pulmonary:     Effort: Pulmonary effort is normal. No respiratory distress.     Breath sounds: Normal breath sounds. No wheezing, rhonchi or rales.  Abdominal:     General: Bowel sounds are normal.     Palpations: Abdomen is soft.  Musculoskeletal:     Right lower leg: No edema.     Left lower leg: No edema.    Laboratory examination:  External labs:   Labs 03/28/2022:  BUN 19, creatinine 1.16, EGFR 54 mL, potassium 4.1.  Vitamin D 40.  A1c 7.1%.  Hb 11.5/HCT 36.5, platelets 332.  Normal indicis.  Total cholesterol 179, triglycerides 193, HDL 42, LDL 97.  Labs 07/20/2021:  BUN 19, creatinine 1.25, EGFR 49 mL, vitamin D 15.  A1c 7.1%.  Total cholesterol 201, triglycerides 191, HDL 55, LDL 108.   Allergies   Allergies  Allergen Reactions   Ace Inhibitors     cough   Pollen Extract Other (See Comments)   Sulfamethoxazole-Trimethoprim Other (See Comments)    Hospital thinks that this is what caused the the GI bleed Hospital thinks that this is what caused the the GI bleed     Final Medications at End of Visit    Current Outpatient Medications:    albuterol (VENTOLIN HFA) 108 (90 Base) MCG/ACT inhaler, Inhale 2 puffs into the lungs every 4 (four) hours as needed for wheezing. , Disp: , Rfl:    aspirin 81 MG tablet, Take 81 mg by mouth daily., Disp: , Rfl:    cetirizine (ZYRTEC) 10 MG tablet, Take 10 mg by mouth  daily as needed for allergies. , Disp: , Rfl:    cycloSPORINE (RESTASIS) 0.05 % ophthalmic emulsion, Place 1 drop into both eyes 2 (two) times daily., Disp: , Rfl:    dapagliflozin propanediol (FARXIGA) 10 MG TABS tablet, Take 10 mg by mouth daily., Disp: , Rfl:    famotidine (PEPCID) 20 MG tablet, Take 1 tablet (20 mg total) by mouth 2 (two) times daily., Disp: 180 tablet, Rfl: 0   Ferrous Sulfate (IRON) 325 (65 Fe) MG TABS, Take 325 mg by mouth daily., Disp: , Rfl:    fluticasone (FLONASE) 50 MCG/ACT nasal spray, Place 1 spray into the nose daily as needed for allergies. , Disp: , Rfl:    Fluticasone-Salmeterol (ADVAIR) 250-50 MCG/DOSE AEPB, Inhale 1 puff into the lungs 2 (two) times daily., Disp: , Rfl:    Lancets (ACCU-CHEK MULTICLIX) lancets, 1 each by Misc.(Non-Drug; Combo Route) route daily., Disp: , Rfl:    LORazepam (ATIVAN) 0.5 MG tablet, Take 1 tablet by mouth daily., Disp: , Rfl:    LORazepam ER (LOREEV XR) 1 MG CS24, Take by mouth., Disp: , Rfl:    methocarbamol (ROBAXIN) 500 MG tablet, Take 500 mg by mouth every 8 (eight) hours as needed., Disp: , Rfl:    Multiple Vitamin (MULTIVITAMIN) capsule, Take 1 capsule by mouth daily., Disp: , Rfl:    ondansetron (ZOFRAN) 4 MG tablet, Take 4 mg by mouth every 8 (eight) hours as needed for nausea or vomiting., Disp: , Rfl:    sitaGLIPtin-metformin (JANUMET) 50-1000 MG tablet, Take 1 tablet by mouth 2 (two) times daily with a meal. , Disp: , Rfl:    solifenacin (VESICARE) 5 MG tablet, Take 5 mg by mouth daily., Disp: , Rfl:    Vitamin D, Ergocalciferol, (DRISDOL) 1.25 MG (50000 UNIT) CAPS capsule, Take by mouth., Disp: , Rfl:    atenolol (TENORMIN) 50 MG tablet, Take 1 tablet (50 mg total) by mouth 2 (two) times daily., Disp: 180 tablet, Rfl: 0   ezetimibe (ZETIA) 10 MG tablet, Take 1 tablet (10 mg total) by mouth daily., Disp: 90 tablet, Rfl: 0   glucose blood test strip, USE TO CHECK BLOOD SUGAR TWO TIMES DAILY, Disp: , Rfl:     losartan-hydrochlorothiazide (HYZAAR) 100-25 MG tablet, Take 1 tablet by mouth daily., Disp: 90 tablet, Rfl: 0   rosuvastatin (  CRESTOR) 20 MG tablet, Take 1 tablet (20 mg total) by mouth daily., Disp: 90 tablet, Rfl: 0   spironolactone (ALDACTONE) 25 MG tablet, Take 1 tablet (25 mg total) by mouth every morning., Disp: 90 tablet, Rfl: 0   Radiology:   No results found.  Cardiac Studies:    Holter Monitor 24 hours 11/09/2017: No symptoms reported. Predominant rhythm is sinus tachycardia. Patient had 18 hours of sinus tachycardia, maximum heart rate 152 bpm. Average heart rate 106 bpm. Occasional PVCs.  PCV MYOCARDIAL PERFUSION WITH LEXISCAN 01/10/2022  Narrative Lexiscan/modified [facility] nuclear stress test 01/10/2022: 1 Day Rest/Stress protocol. Exercise time 4 minutes, achieved 2.27 METS, 68% of age-predicted maximum heart rate. Stress EKG is non-diagnostic target heart rate not achieved/pharmacologic stress. Normal myocardial perfusion without evidence of reversible ischemia or prior infarct. Left ventricular size normal, calculated LVEF 57%, wall thickness preserved, no regional wall motion abnormalities. Prior study 10/06/2017 exercise time 6 minutes, achieved 7.05 METS, 99% of age-predicted maximum heart rate, normal myocardial perfusion, calculated LVEF 50%. Low risk study.  PCV ECHOCARDIOGRAM COMPLETE 06/09/2022  Narrative Echocardiogram 06/09/2022: Left ventricle cavity is normal in size and wall thickness. Normal global wall motion. Normal LV systolic function with EF 56%. Doppler evidence of grade I (impaired) diastolic dysfunction, normal LAP. Mild (Grade I) mitral regurgitation. Mild tricuspid regurgitation. Mild pulmonic regurgitation. No evidence of pulmonary hypertension. No significant change compared to previous study in 2021. Compared to prior study 11/15/2018: LVEF is improved from 40-45%    EKG   EKG 06/16/2022: Normal sinus rhythm with rate of 75 bpm, normal  axis, no evidence of ischemia, normal EKG.  Compared to 12/03/2021, no change.  Assessment     ICD-10-CM   1. Primary hypertension  I10 losartan-hydrochlorothiazide (HYZAAR) 100-25 MG tablet    spironolactone (ALDACTONE) 25 MG tablet    atenolol (TENORMIN) 50 MG tablet    DISCONTINUED: atenolol (TENORMIN) 50 MG tablet    2. Mixed hyperlipidemia  E78.2 ezetimibe (ZETIA) 10 MG tablet    rosuvastatin (CRESTOR) 20 MG tablet    3. Chronic diastolic heart failure (HCC)  I50.32 spironolactone (ALDACTONE) 25 MG tablet       Medications Discontinued During This Encounter  Medication Reason   omeprazole (PRILOSEC) 20 MG capsule Completed Course   losartan-hydrochlorothiazide (HYZAAR) 100-25 MG tablet Reorder   spironolactone (ALDACTONE) 25 MG tablet Reorder   ezetimibe (ZETIA) 10 MG tablet Reorder   rosuvastatin (CRESTOR) 20 MG tablet Reorder   atenolol (TENORMIN) 50 MG tablet Reorder   atenolol (TENORMIN) 50 MG tablet Reorder     Meds ordered this encounter  Medications   DISCONTD: atenolol (TENORMIN) 50 MG tablet    Sig: Take 1 tablet (50 mg total) by mouth 2 (two) times daily.    Dispense:  60 tablet    Refill:  6    Refills to Jean Abrahams, Jean Melton   ezetimibe (ZETIA) 10 MG tablet    Sig: Take 1 tablet (10 mg total) by mouth daily.    Dispense:  90 tablet    Refill:  0    Refills to Jean Melton   losartan-hydrochlorothiazide (HYZAAR) 100-25 MG tablet    Sig: Take 1 tablet by mouth daily.    Dispense:  90 tablet    Refill:  0    Refills to Jean Abrahams, Jean Melton   rosuvastatin (CRESTOR) 20 MG tablet    Sig: Take 1 tablet (20 mg total) by mouth daily.    Dispense:  90 tablet  Refill:  0    Refills to Jean Abrahams, Jean Melton   spironolactone (ALDACTONE) 25 MG tablet    Sig: Take 1 tablet (25 mg total) by mouth every morning.    Dispense:  90 tablet    Refill:  0    Refills to Jean Abrahams, Jean Melton   atenolol (TENORMIN) 50 MG tablet    Sig: Take 1 tablet (50 mg total) by mouth 2 (two) times daily.     Dispense:  180 tablet    Refill:  0    Refills to Jean Abrahams, Jean Melton    Recommendations:   YENIFER DREESEN is a 62 y.o. with type II diabetes with stage IIIa chronic kidney disease, mixed hyperlipiedmia, and hypertension. In 2019 after a syncopal episode she was diagnosed with nonischemic cardiomyopathy and chronic systolic and diastolic heart failure.  With medical therapy on 03/12/2020,  EF of has normalized.  1. Primary hypertension Since being on losartan and also spironolactone, patient's blood pressure has been under excellent control.  Continue same medical therapy.  Renal function is normal.  2. Mixed hyperlipidemia Patient is now tolerating Crestor and also Zetia, continue the same, advised her to follow-up with her PCP for rechecking the labs.  3. Chronic diastolic heart failure ([place]) She has not had any further episodes of acute decompensated heart failure.  Over the past 3 to 4 years she has remained very stable without acute decompensated heart failure, LVEF is also normalized, previously she had mild to moderate LV systolic dysfunction in [id number].  Otherwise she is on appropriate medical therapy from cardiac standpoint, I will see her back on a as needed basis.  I will request her PCP to prescribe all the medications going forward.    Adrian Prows, MD, [facility] 08/26/2022, 10:45 AM Office: [phone] Fax: [phone] Pager: [phone]

## 2022-09-13 ENCOUNTER — Other Ambulatory Visit: Payer: Self-pay | Admitting: Nurse Practitioner

## 2022-09-13 DIAGNOSIS — Z1231 Encounter for screening mammogram for malignant neoplasm of breast: Secondary | ICD-10-CM

## 2022-10-06 ENCOUNTER — Other Ambulatory Visit: Payer: Self-pay | Admitting: Cardiology

## 2022-10-06 DIAGNOSIS — E782 Mixed hyperlipidemia: Secondary | ICD-10-CM

## 2022-10-31 ENCOUNTER — Ambulatory Visit: Payer: Medicaid Other

## 2022-11-22 ENCOUNTER — Encounter: Payer: Self-pay | Admitting: Emergency Medicine

## 2022-11-22 ENCOUNTER — Ambulatory Visit
Admission: EM | Admit: 2022-11-22 | Discharge: 2022-11-22 | Disposition: A | Discharge status: Home / Self Care | Payer: Medicaid Other | Attending: Internal Medicine | Admitting: Internal Medicine

## 2022-11-22 ENCOUNTER — Other Ambulatory Visit: Payer: Self-pay

## 2022-11-22 DIAGNOSIS — L239 Allergic contact dermatitis, unspecified cause: Secondary | ICD-10-CM | POA: Diagnosis not present

## 2022-11-22 MED ORDER — DEXAMETHASONE SODIUM PHOSPHATE 10 MG/ML IJ SOLN
10.0000 mg | Freq: Once | INTRAMUSCULAR | Status: AC
  Administered 2022-11-22: 10 mg via INTRAMUSCULAR

## 2022-11-22 MED ORDER — PREDNISONE 20 MG PO TABS: 20.0000 mg | ORAL_TABLET | Freq: Every day | ORAL | 0 refills | Status: AC

## 2022-11-22 NOTE — ED Triage Notes (Signed)
Pt here for generalized rash to arms and legs x 1 week that is itchy

## 2022-11-22 NOTE — ED Provider Notes (Signed)
[facility]    CSN: [id number] Arrival date & time: 11/22/22  0800      History   Chief Complaint Chief Complaint  Patient presents with  . Rash    HPI [name] is a 62 y.o. female.   Patient presents to urgent care for evaluation of rash to the arms and legs that started 1 week ago after she was working in her garden. She states the rash started to the right arm/right elbow and has since spread to the left arm. Rash spread to the bilateral legs 1-2 days ago. Rash is very itchy, non-draining, and has not improved over the last 1 week. She states this has never happened before and she denies history of environmental/medication allergies. No recent medication changes, new foods, or sick contacts with similar rash. No recent steroids/antibiotics.  Denies sensation of throat closing, throat swelling, sore throat, shortness of breath, chest pain, wheezing, and heart palpitations.     Past Medical History:  Diagnosis Date  . Asthma   . B12 deficiency   . Bipolar 2 disorder (HCC)   . Depression   . Diabetes (HCC)   . GERD (gastroesophageal reflux disease)   . Heart failure (HCC)   . Hypercholesterolemia   . Hyperosmolarity due to secondary diabetes (HCC) 09/11/2011  . Hypertension   . Mixed incontinence   . Non-ischemic cardiomyopathy (HCC)   . Osteoarthritis    knees    Patient Active Problem List   Diagnosis Date Noted  . Tibial plateau fracture, right 06/17/2020  . Contusion of left knee 05/27/2020  . Non-ischemic cardiomyopathy (HCC) 10/06/2017  . Bipolar 2 disorder (HCC) 07/18/2017  . Arthritis 07/18/2017  . Iliotibial band syndrome of right side 05/05/2017  . Patellofemoral pain syndrome of both knees 10/28/2016  . Hematochezia   . Esophageal reflux   . Hypercholesterolemia   . Controlled diabetes mellitus type II without complication (HCC)   . Chronic pain of both ankles 07/29/2015  . Mixed incontinence 10/16/2014  . 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 Lichen planus 06/24/2014  . Rash 03/21/2014  . Left elbow pain 10/08/2013  . Hyponatremia 09/09/2011  . Hyperkalemia 09/09/2011  . HTN (hypertension) 09/09/2011  . Dehydration 09/09/2011  . GERD (gastroesophageal reflux disease) 09/09/2011  . Asthma 09/09/2011    Past Surgical History:  Procedure Laterality Date  . ABDOMINAL HYSTERECTOMY    . ABDOMINAL SURGERY    . CATARACT EXTRACTION    . CESAREAN SECTION    . CHOLECYSTECTOMY    . COLONOSCOPY  02/02/2012   Procedure: COLONOSCOPY;  Surgeon: Vertell Novak., MD;  Location: [facility];  Service: Endoscopy;  Laterality: N/A;  . EYE SURGERY    . SKIN BIOPSY    . STOMACH SURGERY      OB History     Gravida  4   Para  4   Term  4   Preterm      AB      Living  5      SAB      IAB      Ectopic      Multiple  1   Live Births  5            Home Medications    Prior to Admission medications   Medication Sig Start Date End Date Taking? Authorizing Provider  albuterol (VENTOLIN HFA) 108 (90 Base) MCG/ACT inhaler Inhale 2 puffs into the lungs every 4 (four) hours  as needed for wheezing.  05/11/15   [provider]  aspirin 81 MG tablet Take 81 mg by mouth daily.    [provider]  atenolol (TENORMIN) 50 MG tablet Take 1 tablet (50 mg total) by mouth 2 (two) times daily. 08/26/22   Yates Decamp, MD  cetirizine (ZYRTEC) 10 MG tablet Take 10 mg by mouth daily as needed for allergies.  05/11/15   [provider]  cycloSPORINE (RESTASIS) 0.05 % ophthalmic emulsion Place 1 drop into both eyes 2 (two) times daily. 05/12/15   [provider]  dapagliflozin propanediol (FARXIGA) 10 MG TABS tablet Take 10 mg by mouth daily. 06/05/21   [provider]  ezetimibe (ZETIA) 10 MG tablet Take 1 tablet (10 mg total) by mouth daily. 10/07/22 01/05/23  Yates Decamp, MD  famotidine (PEPCID) 20 MG tablet Take 1 tablet (20 mg total) by mouth 2 (two) times daily. 01/27/22    Yates Decamp, MD  Ferrous Sulfate (IRON) 325 (65 Fe) MG TABS Take 325 mg by mouth daily.    [provider]  fluticasone (FLONASE) 50 MCG/ACT nasal spray Place 1 spray into the nose daily as needed for allergies.  05/18/18   [provider]  Fluticasone-Salmeterol (ADVAIR) 250-50 MCG/DOSE AEPB Inhale 1 puff into the lungs 2 (two) times daily. 05/11/15   [provider]  glucose blood test strip USE TO CHECK BLOOD SUGAR TWO TIMES DAILY 02/22/17   [provider]  Lancets (ACCU-CHEK MULTICLIX) lancets 1 each by Misc.(Non-Drug; Combo Route) route daily. 08/03/17   [provider]  LORazepam (ATIVAN) 0.5 MG tablet Take 1 tablet by mouth daily. 07/01/20   [provider]  LORazepam ER (LOREEV XR) 1 MG CS24 Take by mouth.    [provider]  losartan-hydrochlorothiazide (HYZAAR) 100-25 MG tablet Take 1 tablet by mouth daily. 08/26/22   Yates Decamp, MD  methocarbamol (ROBAXIN) 500 MG tablet Take 500 mg by mouth every 8 (eight) hours as needed. 11/15/19   [provider]  Multiple Vitamin (MULTIVITAMIN) capsule Take 1 capsule by mouth daily.    [provider]  ondansetron (ZOFRAN) 4 MG tablet Take 4 mg by mouth every 8 (eight) hours as needed for nausea or vomiting.    [provider]  rosuvastatin (CRESTOR) 20 MG tablet Take 1 tablet (20 mg total) by mouth daily. 08/26/22   Yates Decamp, MD  sitaGLIPtin-metformin (JANUMET) 50-1000 MG tablet Take 1 tablet by mouth 2 (two) times daily with a meal.     [provider]  solifenacin (VESICARE) 5 MG tablet Take 5 mg by mouth daily.    [provider]  spironolactone (ALDACTONE) 25 MG tablet Take 1 tablet (25 mg total) by mouth every morning. 08/26/22   Yates Decamp, MD  Vitamin D, Ergocalciferol, (DRISDOL) 1.25 MG (50000 UNIT) CAPS capsule Take by mouth. 12/28/21   [provider]  buPROPion (WELLBUTRIN XL) 150 MG 24 hr tablet Take 150 mg by mouth daily.     09/09/11  [provider]    Family History Family History  Problem Relation Age of Onset  . Diabetes Father   . Hypertension Father   . Heart failure Father   . Breast cancer Neg Hx     Social History Social History   Tobacco Use  . Smoking status: Never  . Smokeless tobacco: Never  Vaping Use  . Vaping Use: Never used  Substance Use Topics  . Alcohol use: Yes    Comment: ocass  .  Drug use: No     Allergies   Ace inhibitors, Pollen extract, and Sulfamethoxazole-trimethoprim   Review of Systems Review of Systems Per HPI  Physical Exam Triage Vital Signs ED Triage Vitals  Enc Vitals Group     BP 11/22/22 0821 115/79     Pulse Rate 11/22/22 0821 81     Resp 11/22/22 0821 18     Temp 11/22/22 0821 97.9 F (36.6 C)     Temp Source 11/22/22 0821 Oral     SpO2 11/22/22 0821 95 %     Weight --      Height --      Head Circumference --      Peak Flow --      Pain Score 11/22/22 0822 0     Pain Loc --      Pain Edu? --      Excl. in GC? --    No data found.  Updated Vital Signs BP 115/79 (BP Location: Left Arm)   Pulse 81   Temp 97.9 F (36.6 C) (Oral)   Resp 18   SpO2 95%   Visual Acuity Right Eye Distance:   Left Eye Distance:   Bilateral Distance:    Right Eye Near:   Left Eye Near:    Bilateral Near:     Physical Exam   UC Treatments / Results  Labs (all labs ordered are listed, but only abnormal results are displayed) Labs Reviewed - No data to display  EKG   Radiology No results found.  Procedures Procedures (including critical care time)  Medications Ordered in UC Medications - No data to display  Initial Impression / Assessment and Plan / UC Course  I have reviewed the triage vital signs and the nursing notes.  Pertinent labs & imaging results that were available during my care of the patient were reviewed by me and considered in my medical decision making (see chart for details).     *** Final Clinical  Impressions(s) / UC Diagnoses   Final diagnoses:  None   Discharge Instructions   None    ED Prescriptions   None    PDMP not reviewed this encounter.

## 2022-11-22 NOTE — Discharge Instructions (Signed)
You have been evaluated today for an allergic reaction. We gave you medicine to help with symptoms.   I gave you a shot of steroid today in the clinic to help with your symptoms. Start taking prednisone 20 mg once daily for the next 5 days tomorrow with breakfast in the morning. Do not take any ibuprofen when taking this medication, you may take Tylenol as needed for pain.  You may take an over the counter antihistamine (Claritin or Zyrtec) for the next 5-7 days as well as a medication called famotidine (Pepcid) over the counter 20mg  daily for 5-7 days to further reduce your symptoms.   Apply Vaseline to the rash as needed to provide moisture and soothe the rash.  Avoid itching the rash as this can lead to a secondary bacterial infection.  If you notice white/yellow drainage from the rash, worsening redness, or worsening swelling/pain to the rash, please return to urgent care or go to your primary care provider for reevaluation.  Please schedule an appointment with your primary care provider for follow-up and ongoing management. Return if you experience rashes, difficulty breathing or swallowing, lip/mouth/tongue swelling, vomiting, or for any other concerning symptoms. If symptoms are severe, please go to the ER for further workup. I hope you feel better!

## 2022-12-19 ENCOUNTER — Encounter: Payer: Self-pay | Admitting: *Deleted

## 2022-12-19 ENCOUNTER — Ambulatory Visit
Admission: EM | Admit: 2022-12-19 | Discharge: 2022-12-19 | Disposition: A | Discharge status: Home / Self Care | Payer: Medicaid Other | Attending: Internal Medicine | Admitting: Internal Medicine

## 2022-12-19 DIAGNOSIS — H60391 Other infective otitis externa, right ear: Secondary | ICD-10-CM | POA: Diagnosis not present

## 2022-12-19 DIAGNOSIS — H6992 Unspecified Eustachian tube disorder, left ear: Secondary | ICD-10-CM | POA: Diagnosis not present

## 2022-12-19 MED ORDER — OFLOXACIN 0.3 % OT SOLN: 10.0000 [drp] | Freq: Every day | OTIC | 0 refills | Status: AC

## 2022-12-19 NOTE — Discharge Instructions (Addendum)
Use ofloxacin eardrops as prescribed to the right ear for the next 7 days.  Avoid placing anything into the ear canals as this can further push wax deeper into the ear canals.  Avoid submerging your head underwater or getting water into the ear canals for the next 10-14 days.   You may use an over-the-counter antihistamine to help dry up the fluid behind your left eardrum contributing to ear fullness sensation.   If you develop any new or worsening symptoms or do not improve in the next 2 to 3 days, please return.  If your symptoms are severe, please go to the emergency room.  Follow-up with your primary care provider for further evaluation and management of your symptoms as well as ongoing wellness visits.  I hope you feel better!

## 2022-12-19 NOTE — ED Provider Notes (Signed)
[facility]    CSN: [id number] Arrival date & time: 12/19/22  0807      History   Chief Complaint Chief Complaint  Patient presents with   Tinnitus   Ear Pain    HPI Jean Melton is a 62 y.o. female.   Patient presents to urgent care for evaluation of left ear fullness sensation and right ear tinnitus/discomfort that started 2-3 days ago after she attempted to clean out her ears with water and Q-tips. She felt she had a cerumen impaction and was attempting to clear this. States she was able to get a lot of of brown and thick earwax out of her right ear but started having ear ringing and tenderness afterwards.  States she felt like she was not able to get everything out.  Denies recent fever, chills, nausea, vomiting, head trauma, viral URI symptoms, and dizziness.  No recent antibiotic/steroid use.  She has not noticed any purulent or bloody drainage coming from the ear canals.  States she has used Debrox over-the-counter to try to help with cerumen impaction further without much relief.     Past Medical History:  Diagnosis Date   Asthma    B12 deficiency    Bipolar 2 disorder (HCC)    Depression    Diabetes (HCC)    GERD (gastroesophageal reflux disease)    Heart failure (HCC)    Hypercholesterolemia    Hyperosmolarity due to secondary diabetes (HCC) 09/11/2011   Hypertension    Mixed incontinence    Non-ischemic cardiomyopathy (HCC)    Osteoarthritis    knees    Patient Active Problem List   Diagnosis Date Noted   Tibial plateau fracture, right 06/17/2020   Contusion of left knee 05/27/2020   Non-ischemic cardiomyopathy (HCC) 10/06/2017   Bipolar 2 disorder (HCC) 07/18/2017   Arthritis 07/18/2017   Iliotibial band syndrome of right side 05/05/2017   Patellofemoral pain syndrome of both knees 10/28/2016   Hematochezia    Esophageal reflux    Hypercholesterolemia    Controlled diabetes mellitus type II without complication (HCC)    Chronic pain of  both ankles 07/29/2015   Mixed incontinence 10/16/2014   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   Lichen planus 06/24/2014   Rash 03/21/2014   Left elbow pain 10/08/2013   Hyponatremia 09/09/2011   Hyperkalemia 09/09/2011   HTN (hypertension) 09/09/2011   Dehydration 09/09/2011   GERD (gastroesophageal reflux disease) 09/09/2011   Asthma 09/09/2011    Past Surgical History:  Procedure Laterality Date   ABDOMINAL HYSTERECTOMY     ABDOMINAL SURGERY     CATARACT EXTRACTION     CESAREAN SECTION     CHOLECYSTECTOMY     COLONOSCOPY  02/02/2012   Procedure: COLONOSCOPY;  Surgeon: Vertell Novak., MD;  Location: [facility];  Service: Endoscopy;  Laterality: N/A;   EYE SURGERY     SKIN BIOPSY     STOMACH SURGERY      OB History     Gravida  4   Para  4   Term  4   Preterm      AB      Living  5      SAB      IAB      Ectopic      Multiple  1   Live Births  5            Home Medications    Prior to Admission medications   Medication Sig Start  Date End Date Taking? Authorizing Provider  aspirin 81 MG tablet Take 81 mg by mouth daily.   Yes [provider]  atenolol (TENORMIN) 50 MG tablet Take 1 tablet (50 mg total) by mouth 2 (two) times daily. 08/26/22  Yes Yates Decamp, MD  cetirizine (ZYRTEC) 10 MG tablet Take 10 mg by mouth daily as needed for allergies.  05/11/15  Yes [provider]  cycloSPORINE (RESTASIS) 0.05 % ophthalmic emulsion Place 1 drop into both eyes 2 (two) times daily. 05/12/15  Yes [provider]  dapagliflozin propanediol (FARXIGA) 10 MG TABS tablet Take 10 mg by mouth daily. 06/05/21  Yes [provider]  ezetimibe (ZETIA) 10 MG tablet Take 1 tablet (10 mg total) by mouth daily. 10/07/22 01/05/23 Yes Yates Decamp, MD  famotidine (PEPCID) 20 MG tablet Take 1 tablet (20 mg total) by mouth 2 (two) times daily. 01/27/22  Yes Yates Decamp, MD  fluticasone (FLONASE) 50 MCG/ACT nasal spray Place 1 spray into the nose  daily as needed for allergies.  05/18/18  Yes [provider]  Fluticasone-Salmeterol (ADVAIR) 250-50 MCG/DOSE AEPB Inhale 1 puff into the lungs 2 (two) times daily. 05/11/15  Yes [provider]  LORazepam ER (LOREEV XR) 1 MG CS24 Take by mouth.   Yes [provider]  losartan-hydrochlorothiazide (HYZAAR) 100-25 MG tablet Take 1 tablet by mouth daily. 08/26/22  Yes Yates Decamp, MD  ofloxacin (FLOXIN) 0.3 % OTIC solution Place 10 drops into the right ear daily for 7 days. 12/19/22 12/26/22 Yes Carlisle Beers, FNP  rosuvastatin (CRESTOR) 20 MG tablet Take 1 tablet (20 mg total) by mouth daily. 08/26/22  Yes Yates Decamp, MD  sitaGLIPtin-metformin (JANUMET) 50-1000 MG tablet Take 1 tablet by mouth 2 (two) times daily with a meal.    Yes [provider]  solifenacin (VESICARE) 5 MG tablet Take 5 mg by mouth daily.   Yes [provider]  spironolactone (ALDACTONE) 25 MG tablet Take 1 tablet (25 mg total) by mouth every morning. 08/26/22  Yes Yates Decamp, MD  albuterol (VENTOLIN HFA) 108 (90 Base) MCG/ACT inhaler Inhale 2 puffs into the lungs every 4 (four) hours as needed for wheezing.  05/11/15   [provider]  Ferrous Sulfate (IRON) 325 (65 Fe) MG TABS Take 325 mg by mouth daily.    [provider]  glucose blood test strip USE TO CHECK BLOOD SUGAR TWO TIMES DAILY 02/22/17   [provider]  Lancets (ACCU-CHEK MULTICLIX) lancets 1 each by Misc.(Non-Drug; Combo Route) route daily. 08/03/17   [provider]  LORazepam (ATIVAN) 0.5 MG tablet Take 1 tablet by mouth daily. 07/01/20   [provider]  methocarbamol (ROBAXIN) 500 MG tablet Take 500 mg by mouth every 8 (eight) hours as needed. 11/15/19   [provider]  Multiple Vitamin (MULTIVITAMIN) capsule Take 1 capsule by mouth daily.    [provider]  ondansetron (ZOFRAN) 4 MG tablet Take 4 mg by mouth every 8 (eight) hours as needed for nausea or  vomiting.    [provider]  Vitamin D, Ergocalciferol, (DRISDOL) 1.25 MG (50000 UNIT) CAPS capsule Take by mouth. 12/28/21   [provider]  buPROPion (WELLBUTRIN XL) 150 MG 24 hr tablet Take 150 mg by mouth daily.    09/09/11  [provider]    Family History Family History  Problem Relation Age of Onset   Diabetes Father    Hypertension Father    Heart failure Father  Breast cancer Neg Hx     Social History Social History   Tobacco Use   Smoking status: Never   Smokeless tobacco: Never  Vaping Use   Vaping Use: Never used  Substance Use Topics   Alcohol use: Yes    Comment: occasionally   Drug use: No     Allergies   Ace inhibitors, Pollen extract, and Sulfamethoxazole-trimethoprim   Review of Systems Review of Systems Per HPI  Physical Exam Triage Vital Signs ED Triage Vitals [12/19/22 0826]  Enc Vitals Group     BP 100/69     Pulse Rate 74     Resp 16     Temp 98.3 F (36.8 C)     Temp Source Oral     SpO2 97 %     Weight      Height      Head Circumference      Peak Flow      Pain Score 5     Pain Loc      Pain Edu?      Excl. in GC?    No data found.  Updated Vital Signs BP 100/69   Pulse 74   Temp 98.3 F (36.8 C) (Oral)   Resp 16   SpO2 97%   Visual Acuity Right Eye Distance:   Left Eye Distance:   Bilateral Distance:    Right Eye Near:   Left Eye Near:    Bilateral Near:     Physical Exam Vitals and nursing note reviewed.  Constitutional:      Appearance: She is not ill-appearing or toxic-appearing.  HENT:     Head: Normocephalic and atraumatic.     Right Ear: Hearing, ear canal and external ear normal. Drainage (Moderate amount of golden/purulent sick drainage to the right ear canal obstructing view of the right tympanic membrane), swelling and tenderness present.     Left Ear: Hearing, tympanic membrane, ear canal and external ear normal.     Ears:     Comments: No tenderness to manipulation  of the auricle of the right ear.     Nose: Nose normal.     Mouth/Throat:     Lips: Pink.     Mouth: Mucous membranes are moist. No injury.     Tongue: No lesions. Tongue does not deviate from midline.     Palate: No mass and lesions.     Pharynx: Oropharynx is clear. Uvula midline. No pharyngeal swelling, oropharyngeal exudate, posterior oropharyngeal erythema or uvula swelling.     Tonsils: No tonsillar exudate or tonsillar abscesses.  Eyes:     General: Lids are normal. Vision grossly intact. Gaze aligned appropriately.     Extraocular Movements: Extraocular movements intact.     Conjunctiva/sclera: Conjunctivae normal.  Pulmonary:     Effort: Pulmonary effort is normal.  Musculoskeletal:     Cervical back: Neck supple.     Right lower leg: No edema.     Left lower leg: No edema.  Skin:    General: Skin is warm and dry.     Capillary Refill: Capillary refill takes less than 2 seconds.     Findings: No rash.  Neurological:     General: No focal deficit present.     Mental Status: She is alert and oriented to person, place, and time. Mental status is at baseline.     Cranial Nerves: No dysarthria or facial asymmetry.  Psychiatric:        Mood and Affect:  Mood normal.        Speech: Speech normal.        Behavior: Behavior normal.        Thought Content: Thought content normal.        Judgment: Judgment normal.      UC Treatments / Results  Labs (all labs ordered are listed, but only abnormal results are displayed) Labs Reviewed - No data to display  EKG   Radiology No results found.  Procedures Procedures (including critical care time)  Medications Ordered in UC Medications - No data to display  Initial Impression / Assessment and Plan / UC Course  I have reviewed the triage vital signs and the nursing notes.  Pertinent labs & imaging results that were available during my care of the patient were reviewed by me and considered in my medical decision making  (see chart for details).   1. Infective otitis externa of right ear, dysfunction of left eustachian tube Presentation is consistent with infective otitis externa of the right ear.  Will treat with ofloxacin eardrops 10 drops to the right ear canal once daily for the next 7 days.  Advised to avoid submerging head underwater/getting any water in the ear canal.  She may use a small amount of rubbing alcohol on the end of a Q-tip to dry up residual fluid after showers.  Advised to avoid using Q-tips to clean out earwax.  Suggested continued use of Debrox as needed in the future. Left ear symptoms likely due to left eustachian tube dysfunction.  May use over-the-counter antihistamine medication to help dry up fluid behind the left eardrum.  Unable to visualize the right tympanic membrane due to swelling, drainage, and tenderness, therefore ofloxacin will be used instead of Ciprodex.  May use Tylenol as needed for pain.  She is agreeable with plan.   Discussed physical exam and available lab work findings in clinic with patient.  Counseled patient regarding appropriate use of medications and potential side effects for all medications recommended or prescribed today. Discussed red flag signs and symptoms of worsening condition,when to call the PCP office, return to urgent care, and when to seek higher level of care in the emergency department. Patient verbalizes understanding and agreement with plan. All questions answered. Patient discharged in stable condition.   Final Clinical Impressions(s) / UC Diagnoses   Final diagnoses:  Infective otitis externa of right ear  Dysfunction of left eustachian tube     Discharge Instructions      Use ofloxacin eardrops as prescribed to the right ear for the next 7 days.  Avoid placing anything into the ear canals as this can further push wax deeper into the ear canals.  Avoid submerging your head underwater or getting water into the ear canals for the next 10-14  days.   You may use an over-the-counter antihistamine to help dry up the fluid behind your left eardrum contributing to ear fullness sensation.   If you develop any new or worsening symptoms or do not improve in the next 2 to 3 days, please return.  If your symptoms are severe, please go to the emergency room.  Follow-up with your primary care provider for further evaluation and management of your symptoms as well as ongoing wellness visits.  I hope you feel better!   ED Prescriptions     Medication Sig Dispense Auth. Provider   ofloxacin (FLOXIN) 0.3 % OTIC solution Place 10 drops into the right ear daily for 7 days. 5 mL  Carlisle Beers, FNP      PDMP not reviewed this encounter.   Reita May [place], [place] 12/19/22 [phone]

## 2022-12-19 NOTE — ED Triage Notes (Signed)
C/O tinnitus and pain in right ear and feeling of fullness in left ear onset Sat. States started after she used H2O2 to clean, and then Qtips.

## 2023-02-01 ENCOUNTER — Other Ambulatory Visit: Payer: Self-pay | Admitting: Cardiology

## 2023-02-01 DIAGNOSIS — E782 Mixed hyperlipidemia: Secondary | ICD-10-CM

## 2023-02-09 ENCOUNTER — Other Ambulatory Visit: Payer: Self-pay | Admitting: Cardiology

## 2023-02-09 DIAGNOSIS — I5032 Chronic diastolic (congestive) heart failure: Secondary | ICD-10-CM

## 2023-02-09 DIAGNOSIS — I10 Essential (primary) hypertension: Secondary | ICD-10-CM

## 2023-02-14 ENCOUNTER — Other Ambulatory Visit: Payer: Self-pay | Admitting: Cardiology

## 2023-02-14 DIAGNOSIS — I10 Essential (primary) hypertension: Secondary | ICD-10-CM

## 2023-02-20 ENCOUNTER — Ambulatory Visit
Admission: RE | Admit: 2023-02-20 | Discharge: 2023-02-20 | Disposition: A | Discharge status: Home / Self Care | Payer: Medicaid Other | Source: Ambulatory Visit | Attending: Nurse Practitioner | Admitting: Nurse Practitioner

## 2023-02-20 DIAGNOSIS — Z1231 Encounter for screening mammogram for malignant neoplasm of breast: Secondary | ICD-10-CM

## 2023-03-13 ENCOUNTER — Other Ambulatory Visit: Payer: Self-pay | Admitting: Cardiology

## 2023-03-13 DIAGNOSIS — E782 Mixed hyperlipidemia: Secondary | ICD-10-CM

## 2023-03-23 ENCOUNTER — Other Ambulatory Visit: Payer: Self-pay | Admitting: Cardiology

## 2023-03-23 DIAGNOSIS — E782 Mixed hyperlipidemia: Secondary | ICD-10-CM

## 2023-05-18 ENCOUNTER — Other Ambulatory Visit: Payer: Self-pay | Admitting: Cardiology

## 2023-05-18 DIAGNOSIS — I10 Essential (primary) hypertension: Secondary | ICD-10-CM

## 2023-05-18 DIAGNOSIS — I5032 Chronic diastolic (congestive) heart failure: Secondary | ICD-10-CM

## 2023-05-18 DIAGNOSIS — E782 Mixed hyperlipidemia: Secondary | ICD-10-CM

## 2023-05-18 MED ORDER — ROSUVASTATIN CALCIUM 20 MG PO TABS: 20.0000 mg | ORAL_TABLET | Freq: Every day | ORAL | 0 refills | Status: DC

## 2023-07-06 ENCOUNTER — Other Ambulatory Visit: Payer: Self-pay | Admitting: Cardiology

## 2023-07-06 DIAGNOSIS — E782 Mixed hyperlipidemia: Secondary | ICD-10-CM

## 2023-08-25 ENCOUNTER — Other Ambulatory Visit: Payer: Self-pay | Admitting: Cardiology

## 2023-08-25 DIAGNOSIS — I10 Essential (primary) hypertension: Secondary | ICD-10-CM

## 2023-09-08 ENCOUNTER — Other Ambulatory Visit: Payer: Self-pay | Admitting: Cardiology

## 2023-09-08 DIAGNOSIS — I10 Essential (primary) hypertension: Secondary | ICD-10-CM

## 2023-10-02 ENCOUNTER — Other Ambulatory Visit: Payer: Self-pay | Admitting: Cardiology

## 2023-10-02 DIAGNOSIS — I10 Essential (primary) hypertension: Secondary | ICD-10-CM

## 2023-10-02 DIAGNOSIS — E782 Mixed hyperlipidemia: Secondary | ICD-10-CM

## 2023-10-03 ENCOUNTER — Other Ambulatory Visit: Payer: Self-pay

## 2023-10-03 DIAGNOSIS — E782 Mixed hyperlipidemia: Secondary | ICD-10-CM

## 2023-10-03 DIAGNOSIS — I5032 Chronic diastolic (congestive) heart failure: Secondary | ICD-10-CM

## 2023-10-03 DIAGNOSIS — I10 Essential (primary) hypertension: Secondary | ICD-10-CM

## 2023-10-03 MED ORDER — EZETIMIBE 10 MG PO TABS: 10.0000 mg | ORAL_TABLET | Freq: Every day | ORAL | 0 refills | Status: AC

## 2023-10-03 MED ORDER — ROSUVASTATIN CALCIUM 20 MG PO TABS
20.0000 mg | ORAL_TABLET | Freq: Every day | ORAL | 0 refills | Status: AC
Start: 2023-10-03 — End: ?

## 2023-10-03 MED ORDER — SPIRONOLACTONE 25 MG PO TABS
25.0000 mg | ORAL_TABLET | Freq: Every morning | ORAL | 0 refills | Status: AC
Start: 2023-10-03 — End: ?

## 2023-10-03 MED ORDER — LOSARTAN POTASSIUM-HCTZ 100-25 MG PO TABS
1.0000 | ORAL_TABLET | Freq: Every day | ORAL | 0 refills | Status: AC
Start: 2023-10-03 — End: ?

## 2023-10-03 MED ORDER — ATENOLOL 50 MG PO TABS
50.0000 mg | ORAL_TABLET | Freq: Two times a day (BID) | ORAL | 0 refills | Status: AC
Start: 2023-10-03 — End: ?

## 2023-10-31 ENCOUNTER — Other Ambulatory Visit: Payer: Self-pay | Admitting: Cardiology

## 2023-10-31 DIAGNOSIS — E782 Mixed hyperlipidemia: Secondary | ICD-10-CM

## 2023-11-03 ENCOUNTER — Other Ambulatory Visit: Payer: Self-pay | Admitting: Cardiology

## 2023-11-03 DIAGNOSIS — E782 Mixed hyperlipidemia: Secondary | ICD-10-CM

## 2023-11-14 ENCOUNTER — Other Ambulatory Visit: Payer: Self-pay | Admitting: Cardiology

## 2023-11-14 DIAGNOSIS — I5032 Chronic diastolic (congestive) heart failure: Secondary | ICD-10-CM

## 2023-11-14 DIAGNOSIS — E782 Mixed hyperlipidemia: Secondary | ICD-10-CM

## 2023-11-14 DIAGNOSIS — I10 Essential (primary) hypertension: Secondary | ICD-10-CM

## 2024-02-28 ENCOUNTER — Other Ambulatory Visit: Payer: Self-pay | Admitting: Cardiology

## 2024-02-28 DIAGNOSIS — I10 Essential (primary) hypertension: Secondary | ICD-10-CM

## 2024-02-28 DIAGNOSIS — E782 Mixed hyperlipidemia: Secondary | ICD-10-CM

## 2024-02-29 ENCOUNTER — Other Ambulatory Visit: Payer: Self-pay | Admitting: Nurse Practitioner

## 2024-02-29 DIAGNOSIS — Z1231 Encounter for screening mammogram for malignant neoplasm of breast: Secondary | ICD-10-CM

## 2024-02-29 NOTE — Telephone Encounter (Signed)
 Pt of Dr. Ladona. She's overdue and passed her 3rd attempt. Does Dr. Ladona want to refill? Please advise.

## 2024-02-29 NOTE — Telephone Encounter (Signed)
 Patient is following with Dr Ganji as needed.    From last office note- I will request her PCP to prescribe all the medications going forward.    I spoke with [facility] and they will send refill requests to patient's PCP

## 2024-04-16 ENCOUNTER — Ambulatory Visit
Admission: RE | Admit: 2024-04-16 | Discharge: 2024-04-16 | Disposition: A | Source: Ambulatory Visit | Attending: Nurse Practitioner | Admitting: Nurse Practitioner

## 2024-04-16 DIAGNOSIS — Z1231 Encounter for screening mammogram for malignant neoplasm of breast: Secondary | ICD-10-CM

## 2024-04-25 ENCOUNTER — Telehealth: Payer: Self-pay

## 2024-04-25 DIAGNOSIS — Z0000 Encounter for general adult medical examination without abnormal findings: Secondary | ICD-10-CM
